# Patient Record
Sex: Female | Born: 1990 | Hispanic: Yes | Marital: Single | Smoking: Never smoker
Health system: Southern US, Community
[De-identification: ages and names within clinical notes are randomized; demographics above are authoritative.]

## PROBLEM LIST (undated history)

## (undated) ENCOUNTER — Ambulatory Visit: Payer: Medicaid Other

## (undated) DIAGNOSIS — F419 Anxiety disorder, unspecified: Secondary | ICD-10-CM

## (undated) DIAGNOSIS — D649 Anemia, unspecified: Secondary | ICD-10-CM

## (undated) DIAGNOSIS — K2970 Gastritis, unspecified, without bleeding: Secondary | ICD-10-CM

## (undated) DIAGNOSIS — K279 Peptic ulcer, site unspecified, unspecified as acute or chronic, without hemorrhage or perforation: Secondary | ICD-10-CM

## (undated) DIAGNOSIS — R7303 Prediabetes: Secondary | ICD-10-CM

## (undated) DIAGNOSIS — K219 Gastro-esophageal reflux disease without esophagitis: Secondary | ICD-10-CM

## (undated) HISTORY — PX: TUBAL LIGATION: SHX77

## (undated) HISTORY — DX: Anxiety disorder, unspecified: F41.9

## (undated) HISTORY — DX: Gastritis, unspecified, without bleeding: K29.70

## (undated) HISTORY — DX: Prediabetes: R73.03

## (undated) HISTORY — DX: Anemia, unspecified: D64.9

## (undated) HISTORY — PX: CHOLECYSTECTOMY: SHX55

## (undated) HISTORY — DX: Gastro-esophageal reflux disease without esophagitis: K21.9

---

## 2017-04-04 ENCOUNTER — Emergency Department (HOSPITAL_COMMUNITY)
Admission: EM | Admit: 2017-04-04 | Discharge: 2017-04-04 | Disposition: A | Discharge status: Home / Self Care | Payer: Medicaid Other | Attending: Emergency Medicine | Admitting: Emergency Medicine

## 2017-04-04 ENCOUNTER — Encounter (HOSPITAL_COMMUNITY): Payer: Self-pay

## 2017-04-04 DIAGNOSIS — R1010 Upper abdominal pain, unspecified: Secondary | ICD-10-CM | POA: Insufficient documentation

## 2017-04-04 DIAGNOSIS — R42 Dizziness and giddiness: Secondary | ICD-10-CM | POA: Diagnosis not present

## 2017-04-04 DIAGNOSIS — R197 Diarrhea, unspecified: Secondary | ICD-10-CM | POA: Insufficient documentation

## 2017-04-04 DIAGNOSIS — A084 Viral intestinal infection, unspecified: Secondary | ICD-10-CM | POA: Insufficient documentation

## 2017-04-04 DIAGNOSIS — R112 Nausea with vomiting, unspecified: Secondary | ICD-10-CM | POA: Diagnosis present

## 2017-04-04 LAB — COMPREHENSIVE METABOLIC PANEL
ALT: 38 U/L (ref 14–54)
AST: 36 U/L (ref 15–41)
Albumin: 4.5 g/dL (ref 3.5–5.0)
Alkaline Phosphatase: 83 U/L (ref 38–126)
Anion gap: 14 (ref 5–15)
BUN: 16 mg/dL (ref 6–20)
CO2: 20 mmol/L — ABNORMAL LOW (ref 22–32)
Calcium: 9.1 mg/dL (ref 8.9–10.3)
Chloride: 105 mmol/L (ref 101–111)
Creatinine, Ser: 0.69 mg/dL (ref 0.44–1.00)
GFR calc Af Amer: >60 mL/min (ref >60)
GFR calc non Af Amer: >60 mL/min (ref >60)
Glucose, Bld: 132 mg/dL — ABNORMAL HIGH (ref 65–99)
Potassium: 4 mmol/L (ref 3.5–5.1)
Sodium: 139 mmol/L (ref 135–145)
Total Bilirubin: 0.5 mg/dL (ref 0.3–1.2)
Total Protein: 8.8 g/dL — ABNORMAL HIGH (ref 6.5–8.1)

## 2017-04-04 LAB — CBC WITH DIFFERENTIAL/PLATELET
Basophils Absolute: 0 10*3/uL (ref 0.0–0.1)
Basophils Relative: 0 %
Eosinophils Absolute: 0 10*3/uL (ref 0.0–0.7)
Eosinophils Relative: 0 %
HCT: 44.2 % (ref 36.0–46.0)
Hemoglobin: 14.5 g/dL (ref 12.0–15.0)
Lymphocytes Relative: 7 %
Lymphs Abs: 1.1 10*3/uL (ref 0.7–4.0)
MCH: 27.5 pg (ref 26.0–34.0)
MCHC: 32.8 g/dL (ref 30.0–36.0)
MCV: 83.7 fL (ref 78.0–100.0)
Monocytes Absolute: 0.5 10*3/uL (ref 0.1–1.0)
Monocytes Relative: 3 %
Neutro Abs: 13.8 10*3/uL — ABNORMAL HIGH (ref 1.7–7.7)
Neutrophils Relative %: 90 %
Platelets: 362 10*3/uL (ref 150–400)
RBC: 5.28 MIL/uL — ABNORMAL HIGH (ref 3.87–5.11)
RDW: 13.8 % (ref 11.5–15.5)
WBC: 15.4 10*3/uL — ABNORMAL HIGH (ref 4.0–10.5)

## 2017-04-04 LAB — LIPASE, BLOOD: Lipase: 25 U/L (ref 11–51)

## 2017-04-04 LAB — HCG, QUANTITATIVE, PREGNANCY: hCG, Beta Chain, Quant, S: <1 m[IU]/mL (ref <5)

## 2017-04-04 MED ORDER — ONDANSETRON HCL 4 MG/2ML IJ SOLN
4.0000 mg | Freq: Once | INTRAMUSCULAR | Status: AC
  Administered 2017-04-04: 4 mg via INTRAVENOUS
  Filled 2017-04-04: qty 2

## 2017-04-04 MED ORDER — ONDANSETRON 4 MG PO TBDP: 4.0000 mg | ORAL_TABLET | Freq: Three times a day (TID) | ORAL | 0 refills | Status: DC | PRN

## 2017-04-04 MED ORDER — SODIUM CHLORIDE 0.9 % IV BOLUS (SEPSIS)
1000.0000 mL | Freq: Once | INTRAVENOUS | Status: AC
  Administered 2017-04-04: 1000 mL via INTRAVENOUS

## 2017-04-04 MED ORDER — MORPHINE SULFATE (PF) 4 MG/ML IV SOLN
8.0000 mg | Freq: Once | INTRAVENOUS | Status: AC
  Administered 2017-04-04: 8 mg via INTRAVENOUS
  Filled 2017-04-04: qty 2

## 2017-04-04 MED ORDER — GI COCKTAIL ~~LOC~~
30.0000 mL | Freq: Once | ORAL | Status: AC
  Administered 2017-04-04: 30 mL via ORAL
  Filled 2017-04-04: qty 30

## 2017-04-04 MED ORDER — PANTOPRAZOLE SODIUM 40 MG PO TBEC: 40.0000 mg | DELAYED_RELEASE_TABLET | Freq: Every day | ORAL | 0 refills | Status: DC

## 2017-04-04 NOTE — ED Notes (Signed)
Pt complains of flulike sx with nausea and vomiting since 1am

## 2017-04-04 NOTE — ED Provider Notes (Signed)
[facility]-EMERGENCY DEPT Provider Note   CSN: [id number] Arrival date & time: 04/04/17  0636     History   Chief Complaint Chief Complaint  Patient presents with  . flu like sx  . Emesis    HPI Berenda MoraleFranchesca Buntin is a 27 y.o. female.  HPI  27 year old female presents with vomiting and upper abdominal pain.  Patient speaks Spanish and the history is obtained with help of the Spanish interpreter line.  Since last night around 11 PM the patient's been having multiple episodes of emesis and a throbbing upper abdominal pain.  There is some back pain as well.  She denies any urinary symptoms or missed menstrual cycles.  She has been unable to keep down water.  This morning just prior to arrival she also developed diarrhea.  She denies cough, shortness of breath, chest pain.  She had subjective fevers and states EMS noted a fever but she does not know the number.  She has previously had her gallbladder surgically removed.  The pain is severe.  History reviewed. No pertinent past medical history.  There are no active problems to display for this patient.   History reviewed. No pertinent surgical history.  OB History    No data available       Home Medications    Prior to Admission medications   Medication Sig Start Date End Date Taking? Authorizing Provider  ondansetron (ZOFRAN ODT) 4 MG disintegrating tablet Take 1 tablet (4 mg total) by mouth every 8 (eight) hours as needed for nausea or vomiting. 04/04/17   Pricilla LovelessGoldston, Ilithyia Titzer, MD  pantoprazole (PROTONIX) 40 MG tablet Take 1 tablet (40 mg total) by mouth daily. 04/04/17   Pricilla LovelessGoldston, Jabarri Stefanelli, MD    Family History History reviewed. No pertinent family history.  Social History Social History   Tobacco Use  . Smoking status: Never Smoker  . Smokeless tobacco: Never Used  Substance Use Topics  . Alcohol use: No    Frequency: Never  . Drug use: No     Allergies   Patient has no allergy information on  record.   Review of Systems Review of Systems  Constitutional: Positive for fever (subjective).  Respiratory: Negative for cough and shortness of breath.   Cardiovascular: Negative for chest pain.  Gastrointestinal: Positive for abdominal pain, diarrhea, nausea and vomiting.  Genitourinary: Negative for dysuria, hematuria and menstrual problem.  Musculoskeletal: Positive for back pain.  Neurological: Positive for dizziness.  All other systems reviewed and are negative.    Physical Exam Updated Vital Signs BP 115/77   Pulse 78   Temp 98.3 F (36.8 C) (Oral)   Resp 12   Ht 5\' 9"  (1.753 m)   Wt 116.1 kg (256 lb)   LMP 03/10/2017   SpO2 100%   BMI 37.80 kg/m   Physical Exam  Constitutional: She is oriented to person, place, and time. She appears well-developed and well-nourished.  obese  HENT:  Head: Normocephalic and atraumatic.  Right Ear: External ear normal.  Left Ear: External ear normal.  Nose: Nose normal.  Eyes: Right eye exhibits no discharge. Left eye exhibits no discharge.  Cardiovascular: Normal rate, regular rhythm and normal heart sounds.  Pulmonary/Chest: Effort normal and breath sounds normal.  Abdominal: Soft. There is tenderness in the epigastric area.  Neurological: She is alert and oriented to person, place, and time.  Skin: Skin is warm and dry. She is not diaphoretic.  Nursing note and vitals reviewed.    ED Treatments /  Results  Labs (all labs ordered are listed, but only abnormal results are displayed) Labs Reviewed  COMPREHENSIVE METABOLIC PANEL - Abnormal; Notable for the following components:      Result Value   CO2 20 (*)    Glucose, Bld 132 (*)    Total Protein 8.8 (*)    All other components within normal limits  CBC WITH DIFFERENTIAL/PLATELET - Abnormal; Notable for the following components:   WBC 15.4 (*)    RBC 5.28 (*)    Neutro Abs 13.8 (*)    All other components within normal limits  LIPASE, BLOOD  HCG, QUANTITATIVE,  PREGNANCY    EKG  EKG Interpretation None       Radiology No results found.  Procedures Procedures (including critical care time)  Medications Ordered in ED Medications  sodium chloride 0.9 % bolus 1,000 mL (0 mLs Intravenous Stopped 04/04/17 1033)  morphine 4 MG/ML injection 8 mg (8 mg Intravenous Given 04/04/17 0913)  ondansetron (ZOFRAN) injection 4 mg (4 mg Intravenous Given 04/04/17 0911)  sodium chloride 0.9 % bolus 1,000 mL (0 mLs Intravenous Stopped 04/04/17 1252)  gi cocktail (Maalox,Lidocaine,Donnatal) (30 mLs Oral Given 04/04/17 1116)     Initial Impression / Assessment and Plan / ED Course  I have reviewed the triage vital signs and the nursing notes.  Pertinent labs & imaging results that were available during my care of the patient were reviewed by me and considered in my medical decision making (see chart for details).     Patient's symptoms are c/w viral gastroenteritis with vomiting, upper abd pain and some diarrhea. Appears better after GI cocktail and fluids. No urinary complaints. Already has had her gallbladder removed. Thus my suspicion for acute emergent pathology in abdomen is low, and I don't think CT would be beneficial. Discussed supportive care, fluids and return precautions.   Final Clinical Impressions(s) / ED Diagnoses   Final diagnoses:  Viral gastroenteritis  Upper abdominal pain    ED Discharge Orders        Ordered    pantoprazole (PROTONIX) 40 MG tablet  Daily     04/04/17 1241    ondansetron (ZOFRAN ODT) 4 MG disintegrating tablet  Every 8 hours PRN     04/04/17 1241       Pricilla Loveless, MD 04/05/17 1141

## 2017-04-04 NOTE — ED Triage Notes (Signed)
Pt complains of feeling weak and having general body aches since this am, pt also states that she's has vomited several times

## 2017-04-07 ENCOUNTER — Encounter (HOSPITAL_COMMUNITY): Payer: Self-pay | Admitting: Emergency Medicine

## 2017-04-07 DIAGNOSIS — R109 Unspecified abdominal pain: Secondary | ICD-10-CM | POA: Diagnosis present

## 2017-04-07 DIAGNOSIS — Z5321 Procedure and treatment not carried out due to patient leaving prior to being seen by health care provider: Secondary | ICD-10-CM | POA: Insufficient documentation

## 2017-04-07 LAB — COMPREHENSIVE METABOLIC PANEL
ALT: 61 U/L — ABNORMAL HIGH (ref 14–54)
AST: 49 U/L — ABNORMAL HIGH (ref 15–41)
Albumin: 4.3 g/dL (ref 3.5–5.0)
Alkaline Phosphatase: 73 U/L (ref 38–126)
Anion gap: 12 (ref 5–15)
BUN: 11 mg/dL (ref 6–20)
CO2: 19 mmol/L — ABNORMAL LOW (ref 22–32)
Calcium: 8.9 mg/dL (ref 8.9–10.3)
Chloride: 110 mmol/L (ref 101–111)
Creatinine, Ser: 0.77 mg/dL (ref 0.44–1.00)
GFR calc Af Amer: >60 mL/min (ref >60)
GFR calc non Af Amer: >60 mL/min (ref >60)
Glucose, Bld: 93 mg/dL (ref 65–99)
Potassium: 3.4 mmol/L — ABNORMAL LOW (ref 3.5–5.1)
Sodium: 141 mmol/L (ref 135–145)
Total Bilirubin: 0.6 mg/dL (ref 0.3–1.2)
Total Protein: 8.8 g/dL — ABNORMAL HIGH (ref 6.5–8.1)

## 2017-04-07 LAB — CBC
HCT: 45.5 % (ref 36.0–46.0)
Hemoglobin: 15.3 g/dL — ABNORMAL HIGH (ref 12.0–15.0)
MCH: 27.7 pg (ref 26.0–34.0)
MCHC: 33.6 g/dL (ref 30.0–36.0)
MCV: 82.3 fL (ref 78.0–100.0)
Platelets: 335 10*3/uL (ref 150–400)
RBC: 5.53 MIL/uL — ABNORMAL HIGH (ref 3.87–5.11)
RDW: 13.5 % (ref 11.5–15.5)
WBC: 7.6 10*3/uL (ref 4.0–10.5)

## 2017-04-07 LAB — I-STAT BETA HCG BLOOD, ED (MC, WL, AP ONLY): I-stat hCG, quantitative: <5 m[IU]/mL (ref <5)

## 2017-04-07 LAB — LIPASE, BLOOD: Lipase: 27 U/L (ref 11–51)

## 2017-04-07 MED ORDER — ONDANSETRON 4 MG PO TBDP
4.0000 mg | ORAL_TABLET | Freq: Once | ORAL | Status: AC | PRN
  Administered 2017-04-07: 4 mg via ORAL
  Filled 2017-04-07: qty 1

## 2017-04-07 NOTE — ED Notes (Signed)
Patient states she is unable to give a urine sample at this time 

## 2017-04-07 NOTE — ED Triage Notes (Signed)
Patient having vomiting since 5pm yesterday and generalized pain everywhere. Patient reports that she is having lots of belching. No issues with urination. Reports that Friday she came in ambulance very early for v/d.

## 2017-04-08 ENCOUNTER — Emergency Department (HOSPITAL_COMMUNITY)
Admission: EM | Admit: 2017-04-08 | Discharge: 2017-04-08 | Disposition: A | Discharge status: Home / Self Care | Payer: Medicaid Other | Attending: Emergency Medicine | Admitting: Emergency Medicine

## 2017-04-08 NOTE — ED Notes (Signed)
Pt called to be roomed without response.  RN notified.

## 2017-07-23 ENCOUNTER — Emergency Department (HOSPITAL_COMMUNITY)
Admission: EM | Admit: 2017-07-23 | Discharge: 2017-07-23 | Disposition: A | Discharge status: Home / Self Care | Payer: Medicaid Other | Attending: Emergency Medicine | Admitting: Emergency Medicine

## 2017-07-23 ENCOUNTER — Emergency Department (HOSPITAL_COMMUNITY): Payer: Medicaid Other

## 2017-07-23 ENCOUNTER — Encounter (HOSPITAL_COMMUNITY): Payer: Self-pay | Admitting: Emergency Medicine

## 2017-07-23 DIAGNOSIS — Z6841 Body Mass Index (BMI) 40.0 and over, adult: Secondary | ICD-10-CM | POA: Insufficient documentation

## 2017-07-23 DIAGNOSIS — R519 Headache, unspecified: Secondary | ICD-10-CM

## 2017-07-23 DIAGNOSIS — R079 Chest pain, unspecified: Secondary | ICD-10-CM | POA: Diagnosis not present

## 2017-07-23 DIAGNOSIS — R202 Paresthesia of skin: Secondary | ICD-10-CM | POA: Diagnosis not present

## 2017-07-23 DIAGNOSIS — R51 Headache: Secondary | ICD-10-CM | POA: Diagnosis present

## 2017-07-23 DIAGNOSIS — R29898 Other symptoms and signs involving the musculoskeletal system: Secondary | ICD-10-CM | POA: Insufficient documentation

## 2017-07-23 DIAGNOSIS — G43109 Migraine with aura, not intractable, without status migrainosus: Secondary | ICD-10-CM | POA: Insufficient documentation

## 2017-07-23 LAB — URINALYSIS, ROUTINE W REFLEX MICROSCOPIC
Bilirubin Urine: NEGATIVE
Glucose, UA: NEGATIVE mg/dL
Hgb urine dipstick: NEGATIVE
Ketones, ur: NEGATIVE mg/dL
Leukocytes, UA: NEGATIVE
Nitrite: NEGATIVE
Protein, ur: NEGATIVE mg/dL
Specific Gravity, Urine: 1.01 (ref 1.005–1.030)
pH: 7 (ref 5.0–8.0)

## 2017-07-23 LAB — BASIC METABOLIC PANEL
Anion gap: 10 (ref 5–15)
BUN: 13 mg/dL (ref 6–20)
CO2: 27 mmol/L (ref 22–32)
Calcium: 9.3 mg/dL (ref 8.9–10.3)
Chloride: 104 mmol/L (ref 101–111)
Creatinine, Ser: 0.7 mg/dL (ref 0.44–1.00)
GFR calc Af Amer: >60 mL/min (ref >60)
GFR calc non Af Amer: >60 mL/min (ref >60)
Glucose, Bld: 96 mg/dL (ref 65–99)
Potassium: 4 mmol/L (ref 3.5–5.1)
Sodium: 141 mmol/L (ref 135–145)

## 2017-07-23 LAB — CBC
HCT: 41.9 % (ref 36.0–46.0)
Hemoglobin: 13.6 g/dL (ref 12.0–15.0)
MCH: 27.2 pg (ref 26.0–34.0)
MCHC: 32.5 g/dL (ref 30.0–36.0)
MCV: 83.8 fL (ref 78.0–100.0)
Platelets: 330 10*3/uL (ref 150–400)
RBC: 5 MIL/uL (ref 3.87–5.11)
RDW: 13.2 % (ref 11.5–15.5)
WBC: 9.1 10*3/uL (ref 4.0–10.5)

## 2017-07-23 LAB — I-STAT TROPONIN, ED: Troponin i, poc: 0 ng/mL (ref 0.00–0.08)

## 2017-07-23 LAB — RAPID URINE DRUG SCREEN, HOSP PERFORMED
Amphetamines: NOT DETECTED
Barbiturates: NOT DETECTED
Benzodiazepines: NOT DETECTED
Cocaine: NOT DETECTED
Opiates: NOT DETECTED
Tetrahydrocannabinol: NOT DETECTED

## 2017-07-23 LAB — TSH: TSH: 2.199 u[IU]/mL (ref 0.350–4.500)

## 2017-07-23 LAB — I-STAT BETA HCG BLOOD, ED (MC, WL, AP ONLY): I-stat hCG, quantitative: <5 m[IU]/mL (ref <5)

## 2017-07-23 MED ORDER — METOCLOPRAMIDE HCL 5 MG/ML IJ SOLN
10.0000 mg | Freq: Once | INTRAMUSCULAR | Status: AC
  Administered 2017-07-23: 10 mg via INTRAVENOUS
  Filled 2017-07-23: qty 2

## 2017-07-23 MED ORDER — DIPHENHYDRAMINE HCL 50 MG/ML IJ SOLN
25.0000 mg | Freq: Once | INTRAMUSCULAR | Status: AC
  Administered 2017-07-23: 25 mg via INTRAVENOUS
  Filled 2017-07-23: qty 1

## 2017-07-23 NOTE — ED Notes (Addendum)
Per [facility] pt has 2 day hx of severe ha, right sided tingling in right arm/leg, only tingling in right leg now, no weakness. [facility] VSS.

## 2017-07-23 NOTE — ED Triage Notes (Signed)
Per [facility] pt from [employer] from her work for dental pain and nausea. Vitals; 136/80, 88Hr, 16R, 97%. Doesn't speak English well, will need interpretor

## 2017-07-23 NOTE — ED Notes (Signed)
Pt verbalized understanding to follow up with neurologist and no further questions, VSS, NAD. D/c home with family driving.

## 2017-07-23 NOTE — ED Notes (Signed)
Patient transported to X-ray 

## 2017-07-23 NOTE — ED Notes (Signed)
NEURO INTACT AT DISCHARGE.

## 2017-07-23 NOTE — ED Triage Notes (Signed)
Using [facility] spanish interpretor pt c/o central chest pains, headache, neck pain, right face and arm numbness that started this am around 6am.

## 2017-07-23 NOTE — Discharge Instructions (Addendum)
Follow-up with the neurologist provided.  Return here for any worsening in your condition.

## 2017-07-23 NOTE — ED Notes (Signed)
Patient transported to MRI 

## 2017-07-23 NOTE — ED Provider Notes (Signed)
[facility]-EMERGENC[facility] DEPT Provider Note   CSN: [id number] Arrival date & time: 07/23/17  1030     History   Chief Complaint Chief Complaint  Patient presents with  . Chest Pain  . Numbness  . Headache  . Neck Pain    HPI Cindy Dixon is a 27 y.o. female.  HPI Patient has headache that she describes as being on the right side of her head in the posterior and frontal area.  Today it was fairly severe.  She developed a feeling of tingling in the right upper extremity.  This occurred at 6 AM this morning at work.  Patient reports she has had headaches off and on for a couple of days.  Sometimes is been at the back of her head and towards the top and front.  Today was the first day she had tingling in the arm with it.  She denies she had any difficulty walking or weakness.  Denies any visual changes.  No history of headaches or migraine headache.  No recent illness.  She has been well without other medical problems.  Patient was brought by EMS from work. History reviewed. No pertinent past medical history.  There are no active problems to display for this patient.   History reviewed. No pertinent surgical history.   OB History   None      Home Medications    Prior to Admission medications   Medication Sig Start Date End Date Taking? Authorizing Provider  acetaminophen (TYLENOL) 500 MG tablet Take 500 mg by mouth daily as needed (tooth pain).   Yes [provider]  ibuprofen (ADVIL,MOTRIN) 800 MG tablet Take 800 mg by mouth daily as needed (pain).   Yes [provider]  ondansetron (ZOFRAN ODT) 4 MG disintegrating tablet Take 1 tablet (4 mg total) by mouth every 8 (eight) hours as needed for nausea or vomiting. Patient not taking: Reported on 07/23/2017 04/04/17   Pricilla Loveless, MD  pantoprazole (PROTONIX) 40 MG tablet Take 1 tablet (40 mg total) by mouth daily. Patient not taking: Reported on 07/23/2017 04/04/17   Pricilla Loveless, MD      Family History No family history on file.  Social History Social History   Tobacco Use  . Smoking status: Never Smoker  . Smokeless tobacco: Never Used  Substance Use Topics  . Alcohol use: No    Frequency: Never  . Drug use: No     Allergies   Patient has no known allergies.   Review of Systems Review of Systems 10 Systems reviewed and are negative for acute change except as noted in the HPI.   Physical Exam Updated Vital Signs BP (!) 138/98   Pulse 76   Temp 98.1 F (36.7 C) (Oral)   Resp 19   Ht 5\' 9"  (1.753 m)   Wt 125.2 kg (276 lb)   LMP 07/06/2017   SpO2 100%   BMI 40.76 kg/m   Physical Exam  Constitutional: She is oriented to person, place, and time.  She is alert and nontoxic.  She is sitting up in stretcher.  No somnolence.  No respiratory distress.  Obesity.  HENT:  Head: Normocephalic and atraumatic.  Right Ear: External ear normal.  Left Ear: External ear normal.  Nose: Nose normal.  Mouth/Throat: Oropharynx is clear and moist.  Eyes: Pupils are equal, round, and reactive to light. EOM are normal.  Neck: Neck supple.  Cardiovascular: Normal rate, regular rhythm, normal heart sounds and intact distal pulses.  Pulmonary/Chest: Effort normal and breath sounds normal.  Abdominal: Soft. She exhibits no distension. There is no tenderness. There is no guarding.  Musculoskeletal: Normal range of motion. She exhibits no edema or tenderness.  Neurological: She is alert and oriented to person, place, and time. She exhibits abnormal muscle tone. Coordination normal.  Nerves II through XII intact.  Speech is clear with normal content.  Slight difference in grip strength right to left.  Grip 4\5 on the right, 5\5 on the left.  No pronator drift.  Patient can individually elevate each lower extremity off of the bed and hold against resistance.  Dorsiflexion and plantarflexion bilaterally.  Sensation intact to light touch.  Skin: Skin is warm and dry.   Psychiatric: She has a normal mood and affect.     ED Treatments / Results  Labs (all labs ordered are listed, but only abnormal results are displayed) Labs Reviewed  BASIC METABOLIC PANEL  CBC  URINALYSIS, ROUTINE W REFLEX MICROSCOPIC  RAPID URINE DRUG SCREEN, HOSP PERFORMED  TSH  I-STAT BETA HCG BLOOD, ED ([facility], [facility], [facility] ONLY)  I-STAT TROPONIN, ED    EKG None  Radiology Dg Chest 2 View  Result Date: 07/23/2017 CLINICAL DATA:  Chest pain EXAM: CHEST - 2 VIEW COMPARISON:  None. FINDINGS: Lungs are clear. Heart size and pulmonary vascularity are normal. No pneumothorax. No adenopathy. No bone lesions. IMPRESSION: No edema or consolidation. Electronically Signed   By: Bretta Bang III M.D.   On: 07/23/2017 12:38    Procedures Procedures (including critical care time) CRITICAL CARE Performed by: Arby Barrette   Total critical care time: 20  minutes  Critical care time was exclusive of separately billable procedures and treating other patients.  Critical care was necessary to treat or prevent imminent or life-threatening deterioration.  Critical care was time spent personally by me on the following activities: development of treatment plan with patient and/or surrogate as well as nursing, discussions with consultants, evaluation of patient's response to treatment, examination of patient, obtaining history from patient or surrogate, ordering and performing treatments and interventions, ordering and review of laboratory studies, ordering and review of radiographic studies, pulse oximetry and re-evaluation of patient's condition. Medications Ordered in ED Medications  metoCLOPramide (REGLAN) injection 10 mg (10 mg Intravenous Given 07/23/17 1327)  diphenhydrAMINE (BENADRYL) injection 25 mg (25 mg Intravenous Given 07/23/17 1327)     Initial Impression / Assessment and Plan / ED Course  I have reviewed the triage vital signs and the nursing notes.  Pertinent labs & imaging  results that were available during my care of the patient were reviewed by me and considered in my medical decision making (see chart for details).    Consult: Discussed with Dr. Rolland Porter will except for transfer to [facility] emergency department for MRI.  Patient will not fit in MRI at [facility].  Final Clinical Impressions(s) / ED Diagnoses   Final diagnoses:  Nonintractable episodic headache, unspecified headache type  Paresthesias  Weakness of right upper extremity   Patient has had headaches off and on for several days.  Today she developed paresthesia and slight weakness on the right side.  Weakness was somewhat equivocal to exam.  Symptoms did seem to improve after administration of Reglan and Benadryl.  I do have suspicion for complex migraine as likely etiology.  Patient however has no personal history of migraines and there is no family history.  So for new onset with unilateral paresthesia or numbness I do feel MRI  should be obtained.  Patient symptoms have resolved with treatment with Reglan Benadryl.  If MRI is negative, I do feel patient stable for discharge and follow-up with neurology on outpatient basis. ED Discharge Orders    None       Arby BarrettePfeiffer, Margurite Duffy, MD 07/23/17 1450

## 2017-07-23 NOTE — ED Notes (Signed)
[facility] CALLED - DOUG DISPATCH- STATES APPROX. 1 HOUR

## 2017-07-23 NOTE — ED Notes (Signed)
ED Provider at bedside. 

## 2017-07-23 NOTE — ED Notes (Signed)
ED TO INPATIENT HANDOFF REPORT  Name/Age/Gender Cindy Dixon 27 y.o. female  Code Status   Home/SNF/Other Home  Chief Complaint dental pain   Level of Care/Admitting Diagnosis ED Disposition    ED Disposition Condition Comment   Transfer to Another Facility  The patient appears reasonably stabilized for transfer considering the current resources, flow, and capabilities available in the ED at this time, and I doubt any other [facility] requiring further screening and/or treatment in the ED prior to transfer is p resent.       Medical History History reviewed. No pertinent past medical history.  Allergies No Known Allergies  IV Location/Drains/Wounds Patient Lines/Drains/Airways Status   Active Line/Drains/Airways    Name:   Placement date:   Placement time:   Site:   Days:   Peripheral IV 07/23/17 Left Antecubital   07/23/17    1211    Antecubital   less than 1          Labs/Imaging Results for orders placed or performed during the hospital encounter of 07/23/17 (from the past 48 hour(s))  Basic metabolic panel     Status: None   Collection Time: 07/23/17 12:52 PM  Result Value Ref Range   Sodium 141 135 - 145 mmol/L   Potassium 4.0 3.5 - 5.1 mmol/L   Chloride 104 101 - 111 mmol/L   CO2 27 22 - 32 mmol/L   Glucose, Bld 96 65 - 99 mg/dL   BUN 13 6 - 20 mg/dL   Creatinine, Ser 0.70 0.44 - 1.00 mg/dL   Calcium 9.3 8.9 - 10.3 mg/dL   GFR calc non Af Amer >60 >60 mL/min   GFR calc Af Amer >60 >60 mL/min    Comment: (NOTE) The eGFR has been calculated using the CKD EPI equation. This calculation has not been validated in all clinical situations. eGFR's persistently <60 mL/min signify possible Chronic Kidney Disease.    Anion gap 10 5 - 15    Comment: Performed at [facility], [place] [address] [place], [place] [address]  CBC     Status: None   Collection Time: 07/23/17 12:52 PM  Result Value Ref Range   WBC 9.1 4.0 - 10.5 K/uL   RBC 5.00  3.87 - 5.11 MIL/uL   Hemoglobin 13.6 12.0 - 15.0 g/dL   HCT 41.9 36.0 - 46.0 %   MCV 83.8 78.0 - 100.0 fL   MCH 27.2 26.0 - 34.0 pg   MCHC 32.5 30.0 - 36.0 g/dL   RDW 13.2 11.5 - 15.5 %   Platelets 330 150 - 400 K/uL    Comment: Performed at [facility], [place] [address], [place], [place] [address]  TSH     Status: None   Collection Time: 07/23/17 12:52 PM  Result Value Ref Range   TSH 2.199 0.350 - 4.500 uIU/mL    Comment: Performed by a 3rd Generation assay with a functional sensitivity of <=0.01 uIU/mL. Performed at [facility], [place] [address] [place], [place] [address]   I-Stat Beta hCG blood, ED ([facility], [facility], [facility] only)     Status: None   Collection Time: 07/23/17  1:01 PM  Result Value Ref Range   I-stat hCG, quantitative <5.0 <5 mIU/mL   Comment 3            Comment:   GEST. AGE      CONC.  (mIU/mL)   <=1 WEEK        5 - 50  2 WEEKS       50 - 500     3 WEEKS       100 - 10,000     4 WEEKS     1,000 - 30,000        FEMALE AND NON-PREGNANT FEMALE:     LESS THAN 5 mIU/mL   I-stat troponin, ED     Status: None   Collection Time: 07/23/17  1:02 PM  Result Value Ref Range   Troponin i, poc 0.00 0.00 - 0.08 ng/mL   Comment 3            Comment: Due to the release kinetics of cTnI, a negative result within the first hours of the onset of symptoms does not rule out myocardial infarction with certainty. If myocardial infarction is still suspected, repeat the test at appropriate intervals.   Urinalysis, Routine w reflex microscopic     Status: None   Collection Time: 07/23/17  1:18 PM  Result Value Ref Range   Color, Urine YELLOW YELLOW   APPearance CLEAR CLEAR   Specific Gravity, Urine 1.010 1.005 - 1.030   pH 7.0 5.0 - 8.0   Glucose, UA NEGATIVE NEGATIVE mg/dL   Hgb urine dipstick NEGATIVE NEGATIVE   Bilirubin Urine NEGATIVE NEGATIVE   Ketones, ur NEGATIVE NEGATIVE mg/dL   Protein, ur NEGATIVE NEGATIVE mg/dL   Nitrite NEGATIVE  NEGATIVE   Leukocytes, UA NEGATIVE NEGATIVE    Comment: Performed at [place] [address] [place], [place] [address]  Urine rapid drug screen (hosp performed)     Status: None   Collection Time: 07/23/17  1:18 PM  Result Value Ref Range   Opiates NONE DETECTED NONE DETECTED   Cocaine NONE DETECTED NONE DETECTED   Benzodiazepines NONE DETECTED NONE DETECTED   Amphetamines NONE DETECTED NONE DETECTED   Tetrahydrocannabinol NONE DETECTED NONE DETECTED   Barbiturates NONE DETECTED NONE DETECTED    Comment: (NOTE) DRUG SCREEN FOR MEDICAL PURPOSES ONLY.  IF CONFIRMATION IS NEEDED FOR ANY PURPOSE, NOTIFY LAB WITHIN 5 DAYS. LOWEST DETECTABLE LIMITS FOR URINE DRUG SCREEN Drug Class                     Cutoff (ng/mL) Amphetamine and metabolites    1000 Barbiturate and metabolites    200 Benzodiazepine                 914 Tricyclics and metabolites     300 Opiates and metabolites        300 Cocaine and metabolites        300 THC                            50 Performed at [facility], [place] [address] [place], [place] [address]    Dg Chest 2 View  Result Date: 07/23/2017 CLINICAL DATA:  Chest pain EXAM: CHEST - 2 VIEW COMPARISON:  None. FINDINGS: Lungs are clear. Heart size and pulmonary vascularity are normal. No pneumothorax. No adenopathy. No bone lesions. IMPRESSION: No edema or consolidation. Electronically Signed   By: Lowella Grip III M.D.   On: 07/23/2017 12:38    Pending Labs Unresulted Labs (From admission, onward)   None      Vitals/Pain Today's Vitals   07/23/17 1203 07/23/17 1254 07/23/17 1530 07/23/17 1545  BP: (!) 138/98  (!) 155/97   Pulse: 76   73  Resp: 19  Temp:      TempSrc:      SpO2: 100%   100%  Weight:  276 lb (125.2 kg)    Height:  5' 9"  (1.753 m)    PainSc:        Isolation Precautions No active isolations  Medications Medications  metoCLOPramide (REGLAN) injection 10 mg (10 mg  Intravenous Given 07/23/17 1327)  diphenhydrAMINE (BENADRYL) injection 25 mg (25 mg Intravenous Given 07/23/17 1327)    Mobility walks

## 2017-07-23 NOTE — ED Provider Notes (Signed)
The patient does not have any signs of stroke on her MRI or other etiologies.  I have advised the patient of the findings and the fact that this is most likely related to her headaches as she does get them frequently.  I will have her follow-up with [facility] for further evaluation and care.   Charlestine NightLawyer, Rieley Khalsa, PA-C 07/23/17 2128    Lorre NickAllen, Anthony, MD 07/23/17 2306

## 2017-07-23 NOTE — ED Notes (Signed)
Patient was brought back from MRI. MRI transporter reported that the scan was unsuccessful due to the patient is to large for the scanner. Informed Dr. Judie PetitM. Pfeiffer of this information.

## 2018-01-14 ENCOUNTER — Encounter (HOSPITAL_COMMUNITY): Payer: Self-pay | Admitting: Emergency Medicine

## 2018-01-14 ENCOUNTER — Emergency Department (HOSPITAL_COMMUNITY): Payer: Medicaid Other

## 2018-01-14 ENCOUNTER — Emergency Department (HOSPITAL_COMMUNITY)
Admission: EM | Admit: 2018-01-14 | Discharge: 2018-01-14 | Disposition: A | Discharge status: Home / Self Care | Payer: Medicaid Other | Attending: Emergency Medicine | Admitting: Emergency Medicine

## 2018-01-14 DIAGNOSIS — R079 Chest pain, unspecified: Secondary | ICD-10-CM | POA: Diagnosis not present

## 2018-01-14 DIAGNOSIS — J111 Influenza due to unidentified influenza virus with other respiratory manifestations: Secondary | ICD-10-CM | POA: Insufficient documentation

## 2018-01-14 DIAGNOSIS — R0602 Shortness of breath: Secondary | ICD-10-CM | POA: Diagnosis not present

## 2018-01-14 DIAGNOSIS — R05 Cough: Secondary | ICD-10-CM | POA: Diagnosis present

## 2018-01-14 DIAGNOSIS — R6889 Other general symptoms and signs: Secondary | ICD-10-CM

## 2018-01-14 MED ORDER — IBUPROFEN 800 MG PO TABS
800.0000 mg | ORAL_TABLET | Freq: Once | ORAL | Status: AC
  Administered 2018-01-14: 800 mg via ORAL
  Filled 2018-01-14: qty 1

## 2018-01-14 MED ORDER — OSELTAMIVIR PHOSPHATE 75 MG PO CAPS: 75.0000 mg | ORAL_CAPSULE | Freq: Two times a day (BID) | ORAL | 0 refills | Status: DC

## 2018-01-14 MED ORDER — ALBUTEROL SULFATE (2.5 MG/3ML) 0.083% IN NEBU
5.0000 mg | INHALATION_SOLUTION | Freq: Once | RESPIRATORY_TRACT | Status: AC
Start: 2018-01-14 — End: 2018-01-14
  Administered 2018-01-14: 5 mg via RESPIRATORY_TRACT
  Filled 2018-01-14: qty 6

## 2018-01-14 NOTE — ED Triage Notes (Signed)
Pt presents to ED for assessment of two days of flu=like symptoms, fever, body aches, nasal congestion and difficulty taking a deep breath.

## 2018-01-14 NOTE — ED Notes (Signed)
Pt stable and ambulatory for discharge, states understanding follow up.  

## 2018-01-14 NOTE — ED Provider Notes (Signed)
[facility] Note   CSN: [id number] Arrival date & time: 01/14/18  1749     History   Chief Complaint Chief Complaint  Patient presents with  . flu-like symptoms    HPI Berenda MoraleFranchesca Oberman is a 27 y.o. female.  The history is provided by the patient. No language interpreter was used.  Cough  This is a new problem. The current episode started yesterday. The problem occurs constantly. The cough is non-productive. The maximum temperature recorded prior to her arrival was 100 to 100.9 F. Associated symptoms include chills and myalgias. She has tried nothing for the symptoms. The treatment provided no relief. She is not a smoker. Her past medical history does not include pneumonia or asthma.    History reviewed. No pertinent past medical history.  There are no active problems to display for this patient.   History reviewed. No pertinent surgical history.   OB History   None      Home Medications    Prior to Admission medications   Medication Sig Start Date End Date Taking? Authorizing Provider  acetaminophen (TYLENOL) 500 MG tablet Take 500 mg by mouth daily as needed (tooth pain).    [provider]  ibuprofen (ADVIL,MOTRIN) 800 MG tablet Take 800 mg by mouth daily as needed (pain).    [provider]  ondansetron (ZOFRAN ODT) 4 MG disintegrating tablet Take 1 tablet (4 mg total) by mouth every 8 (eight) hours as needed for nausea or vomiting. Patient not taking: Reported on 07/23/2017 04/04/17   Pricilla LovelessGoldston, Scott, MD  oseltamivir (TAMIFLU) 75 MG capsule Take 1 capsule (75 mg total) by mouth every 12 (twelve) hours. 01/14/18   Elson AreasSofia, Leslie K, PA-C  pantoprazole (PROTONIX) 40 MG tablet Take 1 tablet (40 mg total) by mouth daily. Patient not taking: Reported on 07/23/2017 04/04/17   Pricilla LovelessGoldston, Scott, MD    Family History History reviewed. No pertinent family history.  Social History Social History   Tobacco Use    . Smoking status: Never Smoker  . Smokeless tobacco: Never Used  Substance Use Topics  . Alcohol use: No    Frequency: Never  . Drug use: No     Allergies   Patient has no known allergies.   Review of Systems Review of Systems  Constitutional: Positive for chills.  Respiratory: Positive for cough.   Musculoskeletal: Positive for myalgias.  All other systems reviewed and are negative.    Physical Exam Updated Vital Signs BP 110/67   Pulse (!) 114   Temp 99.9 F (37.7 C) (Oral)   Resp 14   SpO2 98%   Physical Exam  Constitutional: She is oriented to person, place, and time. She appears well-developed and well-nourished.  HENT:  Head: Normocephalic.  Right Ear: External ear normal.  Left Ear: External ear normal.  Nose: Nose normal.  Mouth/Throat: Oropharynx is clear and moist.  Eyes: EOM are normal.  Neck: Normal range of motion.  Cardiovascular: Normal rate and regular rhythm.  Pulmonary/Chest: Effort normal. She has no wheezes. She exhibits no tenderness.  Abdominal: She exhibits no distension.  Musculoskeletal: Normal range of motion.  Neurological: She is alert and oriented to person, place, and time.  Psychiatric: She has a normal mood and affect.  Nursing note and vitals reviewed.    ED Treatments / Results  Labs (all labs ordered are listed, but only abnormal results are displayed) Labs Reviewed - No data to display  EKG None  Radiology Dg  Chest 2 View  Result Date: 01/14/2018 CLINICAL DATA:  Chest pain and shortness of breath for 10 days. EXAM: CHEST - 2 VIEW COMPARISON:  Chest x-ray dated 07/23/2017. FINDINGS: Study is hypoinspiratory with crowding of the perihilar and bibasilar bronchovascular markings. Given the low lung volumes, lungs appear clear. No confluent opacity to suggest a developing pneumonia. No pleural effusion or pneumothorax seen. Heart size and mediastinal contours are within normal limits. No acute or suspicious osseous  finding. IMPRESSION: Low lung volumes. No active cardiopulmonary disease. No evidence of pneumonia or pulmonary edema. Electronically Signed   By: Bary Richard M.D.   On: 01/14/2018 19:27    Procedures Procedures (including critical care time)  Medications Ordered in ED Medications  ibuprofen (ADVIL,MOTRIN) tablet 800 mg (800 mg Oral Given 01/14/18 1830)  albuterol (PROVENTIL) (2.5 MG/3ML) 0.083% nebulizer solution 5 mg (5 mg Nebulization Given 01/14/18 1830)     Initial Impression / Assessment and Plan / ED Course  I have reviewed the triage vital signs and the nursing notes.  Pertinent labs & imaging results that were available during my care of the patient were reviewed by me and considered in my medical decision making (see chart for details).     MDM  Chest xray no pneumonia.  Pt counseled on probable flu.  Pt does want to take tamiflu  Final Clinical Impressions(s) / ED Diagnoses   Final diagnoses:  Flu-like symptoms    ED Discharge Orders         Ordered    oseltamivir (TAMIFLU) 75 MG capsule  Every 12 hours     01/14/18 2125        An After Visit Summary was printed and given to the patient.    Osie Cheeks 01/14/18 2128    Charlynne Pander, MD 01/15/18 2007

## 2018-01-14 NOTE — Discharge Instructions (Signed)
Return if any problems.

## 2018-07-28 ENCOUNTER — Other Ambulatory Visit: Payer: Self-pay

## 2018-07-28 ENCOUNTER — Emergency Department (HOSPITAL_COMMUNITY)
Admission: EM | Admit: 2018-07-28 | Discharge: 2018-07-28 | Disposition: A | Discharge status: Home / Self Care | Payer: Medicaid Other | Attending: Emergency Medicine | Admitting: Emergency Medicine

## 2018-07-28 DIAGNOSIS — R51 Headache: Secondary | ICD-10-CM | POA: Diagnosis present

## 2018-07-28 DIAGNOSIS — B349 Viral infection, unspecified: Secondary | ICD-10-CM | POA: Diagnosis not present

## 2018-07-28 DIAGNOSIS — Z20828 Contact with and (suspected) exposure to other viral communicable diseases: Secondary | ICD-10-CM | POA: Diagnosis not present

## 2018-07-28 NOTE — ED Triage Notes (Signed)
Pt here for covid testing due to 2 family members testing positive-pt has headache

## 2018-07-28 NOTE — ED Provider Notes (Signed)
[facility] EMERGENCY DEPARTMENT Provider Note   CSN: [id number] Arrival date & time: 07/28/18  1652    History   Chief Complaint Chief Complaint  Patient presents with  . covid sx    HPI Cindy Dixon is a 28 y.o. female.     HPI   28 year old female presents today for coronavirus testing.  Patient notes that her father tested positive.  She notes a generalized headache, and chills x6 days.  She denies any fever, denies any cough or shortness of breath.  Denies any chronic health conditions.     No past medical history on file.  There are no active problems to display for this patient.   No past surgical history on file.   OB History   No obstetric history on file.      Home Medications    Prior to Admission medications   Medication Sig Start Date End Date Taking? Authorizing Provider  acetaminophen (TYLENOL) 500 MG tablet Take 500 mg by mouth daily as needed (tooth pain).    [provider]  ibuprofen (ADVIL,MOTRIN) 800 MG tablet Take 800 mg by mouth daily as needed (pain).    [provider]  ondansetron (ZOFRAN ODT) 4 MG disintegrating tablet Take 1 tablet (4 mg total) by mouth every 8 (eight) hours as needed for nausea or vomiting. Patient not taking: Reported on 07/23/2017 04/04/17   Sherwood Gambler, MD  oseltamivir (TAMIFLU) 75 MG capsule Take 1 capsule (75 mg total) by mouth every 12 (twelve) hours. 01/14/18   Fransico Meadow, PA-C  pantoprazole (PROTONIX) 40 MG tablet Take 1 tablet (40 mg total) by mouth daily. Patient not taking: Reported on 07/23/2017 04/04/17   Sherwood Gambler, MD    Family History No family history on file.  Social History Social History   Tobacco Use  . Smoking status: Never Smoker  . Smokeless tobacco: Never Used  Substance Use Topics  . Alcohol use: No    Frequency: Never  . Drug use: No     Allergies   Patient has no known allergies.   Review of Systems Review of Systems   All other systems reviewed and are negative.    Physical Exam Updated Vital Signs BP (!) 144/60 (BP Location: Right Arm)   Pulse 60   Temp 98.1 F (36.7 C) (Oral)   Resp 16   SpO2 99%   Physical Exam Vitals signs and nursing note reviewed.  Constitutional:      Appearance: She is well-developed.  HENT:     Head: Normocephalic and atraumatic.  Eyes:     General: No scleral icterus.       Right eye: No discharge.        Left eye: No discharge.     Conjunctiva/sclera: Conjunctivae normal.     Pupils: Pupils are equal, round, and reactive to light.  Neck:     Musculoskeletal: Normal range of motion.     Vascular: No JVD.     Trachea: No tracheal deviation.  Pulmonary:     Effort: Pulmonary effort is normal. No respiratory distress.     Breath sounds: Normal breath sounds. No stridor. No wheezing or rales.  Neurological:     Mental Status: She is alert and oriented to person, place, and time.     Coordination: Coordination normal.  Psychiatric:        Behavior: Behavior normal.        Thought Content: Thought content normal.  Judgment: Judgment normal.      ED Treatments / Results  Labs (all labs ordered are listed, but only abnormal results are displayed) Labs Reviewed - No data to display  EKG None  Radiology No results found.  Procedures Procedures (including critical care time)  Medications Ordered in ED Medications - No data to display   Initial Impression / Assessment and Plan / ED Course  I have reviewed the triage vital signs and the nursing notes.  Pertinent labs & imaging results that were available during my care of the patient were reviewed by me and considered in my medical decision making (see chart for details).        28 year old female presents today for COVID-19 testing.  She is afebrile with no objective findings.  Discussed outpatient testing for the patient.  Return precautions given.  She verbalized understanding and  agreement to today's plan.  Spanish translator was used throughout evaluation.  Final Clinical Impressions(s) / ED Diagnoses   Final diagnoses:  Viral illness    ED Discharge Orders    None       Rosalio LoudHedges, Kelsie Zaborowski, PA-C 07/28/18 1804    Benjiman CorePickering, Nathan, MD 07/28/18 [phone]

## 2018-07-28 NOTE — ED Notes (Signed)
Patient verbalizes understanding of discharge instructions. Opportunity for questioning and answers were provided. Pt discharged from ED. 

## 2018-07-28 NOTE — Discharge Instructions (Addendum)
Please read attached information. If you experience any new or worsening signs or symptoms please return to the emergency room for evaluation. Please follow-up with the [facility] as directed.  Please return immediately if you develop any new or worsening signs or symptoms.

## 2018-07-29 DIAGNOSIS — R509 Fever, unspecified: Secondary | ICD-10-CM | POA: Diagnosis not present

## 2018-07-29 DIAGNOSIS — Z20828 Contact with and (suspected) exposure to other viral communicable diseases: Secondary | ICD-10-CM | POA: Diagnosis not present

## 2018-07-29 DIAGNOSIS — R51 Headache: Secondary | ICD-10-CM | POA: Diagnosis not present

## 2018-09-25 ENCOUNTER — Ambulatory Visit: Payer: Medicaid Other | Admitting: Internal Medicine

## 2018-11-12 ENCOUNTER — Encounter: Payer: Self-pay | Admitting: Internal Medicine

## 2018-11-12 ENCOUNTER — Ambulatory Visit: Payer: Medicaid Other | Attending: Internal Medicine | Admitting: Internal Medicine

## 2018-11-12 ENCOUNTER — Other Ambulatory Visit: Payer: Self-pay

## 2018-11-12 VITALS — BP 110/75 | HR 80 | Temp 98.4°F | Resp 18 | Ht 68.0 in | Wt 301.0 lb

## 2018-11-12 DIAGNOSIS — Z6841 Body Mass Index (BMI) 40.0 and over, adult: Secondary | ICD-10-CM | POA: Diagnosis not present

## 2018-11-12 DIAGNOSIS — K219 Gastro-esophageal reflux disease without esophagitis: Secondary | ICD-10-CM

## 2018-11-12 DIAGNOSIS — H538 Other visual disturbances: Secondary | ICD-10-CM | POA: Diagnosis not present

## 2018-11-12 DIAGNOSIS — Z23 Encounter for immunization: Secondary | ICD-10-CM

## 2018-11-12 DIAGNOSIS — L858 Other specified epidermal thickening: Secondary | ICD-10-CM

## 2018-11-12 DIAGNOSIS — Z9049 Acquired absence of other specified parts of digestive tract: Secondary | ICD-10-CM | POA: Diagnosis not present

## 2018-11-12 DIAGNOSIS — H6123 Impacted cerumen, bilateral: Secondary | ICD-10-CM

## 2018-11-12 DIAGNOSIS — Z833 Family history of diabetes mellitus: Secondary | ICD-10-CM | POA: Insufficient documentation

## 2018-11-12 DIAGNOSIS — Z0000 Encounter for general adult medical examination without abnormal findings: Secondary | ICD-10-CM

## 2018-11-12 DIAGNOSIS — Z0001 Encounter for general adult medical examination with abnormal findings: Secondary | ICD-10-CM | POA: Diagnosis not present

## 2018-11-12 MED ORDER — OMEPRAZOLE 20 MG PO CPDR: 20.0000 mg | DELAYED_RELEASE_CAPSULE | Freq: Every day | ORAL | 3 refills | Status: DC

## 2018-11-12 NOTE — Progress Notes (Signed)
Patient ID: Cindy Dixon, female    DOB: 1990/12/06  MRN: [id number]  CC: New Patient (Initial Visit)   Subjective: Kaijah Abts is a 28 y.o. female who presents for new pt visit and requests physical. Her concerns today include:   No previous PCP  Here for physical but not pap.  Currently on cycle.  She denies any chronic medical issues.  She is not on any medications.  Review of system is below. Past medical, family history, social history, surgical history reviewed and updated. Current Outpatient Medications on File Prior to Visit  Medication Sig Dispense Refill  . acetaminophen (TYLENOL) 500 MG tablet Take 500 mg by mouth daily as needed (tooth pain).    Marland Kitchen ibuprofen (ADVIL,MOTRIN) 800 MG tablet Take 800 mg by mouth daily as needed (pain).     No current facility-administered medications on file prior to visit.     No Known Allergies  Social History   Socioeconomic History  . Marital status: Single    Spouse name: Not on file  . Number of children: 2  . Years of education: 58  . Highest education level: Not on file  Occupational History  . Not on file  Social Needs  . Financial resource strain: Not on file  . Food insecurity    Worry: Not on file    Inability: Not on file  . Transportation needs    Medical: Not on file    Non-medical: Not on file  Tobacco Use  . Smoking status: Never Smoker  . Smokeless tobacco: Never Used  Substance and Sexual Activity  . Alcohol use: Yes    Frequency: Never    Comment: occasionally  . Drug use: No  . Sexual activity: Yes  Lifestyle  . Physical activity    Days per week: Not on file    Minutes per session: Not on file  . Stress: Not on file  Relationships  . Social Musician on phone: Not on file    Gets together: Not on file    Attends religious service: Not on file    Active member of club or organization: Not on file    Attends meetings of clubs or organizations: Not on file   Relationship status: Not on file  . Intimate partner violence    Fear of current or ex partner: Not on file    Emotionally abused: Not on file    Physically abused: Not on file    Forced sexual activity: Not on file  Other Topics Concern  . Not on file  Social History Narrative  . Not on file    Family History  Problem Relation Age of Onset  . Hypertension Mother   . Hypertension Father   . Arrhythmia Father   . Diabetes Maternal Grandfather   . Diabetes Paternal Grandfather     Past Surgical History:  Procedure Laterality Date  . CHOLECYSTECTOMY      ROS: Review of Systems  Constitutional: Positive for appetite change (over eats and feels very anxious about it and like she can not control her eating). Negative for activity change and unexpected weight change (gained 61 lbs in the past few yrs).  HENT:       Gets a lo of wax build up  Eyes: Positive for visual disturbance (blurred a time.  Last ye exam was 3 yrs ago.  Does not wear glasses).  Respiratory: Negative for cough and shortness of breath.   Cardiovascular: Negative  for chest pain.  Gastrointestinal:       Complains of bad acid reflux.  She is not on any NSAIDs.  Genitourinary:       Reports heart burn over past few mths.  Not on any OTC NSAIDs.  Psychiatric/Behavioral: The patient is nervous/anxious.     PHYSICAL EXAM: BP 110/75 (BP Location: Left Arm, Patient Position: Sitting, Cuff Size: Large)   Pulse 80   Temp 98.4 F (36.9 C) (Oral)   Resp 18   Ht 5\' 8"  (1.727 m)   Wt (!) 301 lb (136.5 kg)   LMP 11/11/2018   SpO2 99%   BMI 45.77 kg/m   Physical Exam  General appearance - alert, well appearing, morbidly obese young Hispanic female and in no distress Mental status - normal mood, behavior, speech, dress, motor activity, and thought processes Eyes - pupils equal and reactive, extraocular eye movements intact Ears - bilateral TM's and external ear canals normal, ceruminosis noted Nose - normal and  patent, no erythema, discharge or polyps Mouth - mucous membranes moist, pharynx normal without lesions Neck - supple, no significant adenopathy Lymphatics - no palpable lymphadenopathy, no hepatosplenomegaly Chest - clear to auscultation, no wheezes, rales or rhonchi, symmetric air entry Heart - normal rate, regular rhythm, normal S1, S2, no murmurs, rubs, clicks or gallops Abdomen - soft, nontender, nondistended, no masses or organomegaly Pelvic exam: Deferred as patient is currently on her menstrual cycle. Neurological - cranial nerves II through XII intact, motor and sensory grossly normal bilaterally Musculoskeletal - no joint tenderness, deformity or swelling Extremities - peripheral pulses normal, no pedal edema, no clubbing or cyanosis Skin: Small horn in the left temporal area  Depression screen [facility] 2/9 11/12/2018  Decreased Interest 0  Down, Depressed, Hopeless 0  PHQ - 2 Score 0  Altered sleeping 0  Tired, decreased energy 0  Change in appetite 3  Feeling bad or failure about yourself  0  Trouble concentrating 0  Moving slowly or fidgety/restless 0  Suicidal thoughts 0  PHQ-9 Score 3   GAD 7 : Generalized Anxiety Score 11/12/2018  Nervous, Anxious, on Edge 3  Control/stop worrying 1  Worry too much - different things 1  Trouble relaxing 0  Restless 0  Easily annoyed or irritable 0  Afraid - awful might happen 0  Total GAD 7 Score 5     CMP Latest Ref Rng & Units 07/23/2017 04/07/2017 04/04/2017  Glucose 65 - 99 mg/dL 96 93 132(H)  BUN 6 - 20 mg/dL 13 11 16   Creatinine 0.44 - 1.00 mg/dL 0.70 0.77 0.69  Sodium 135 - 145 mmol/L 141 141 139  Potassium 3.5 - 5.1 mmol/L 4.0 3.4(L) 4.0  Chloride 101 - 111 mmol/L 104 110 105  CO2 22 - 32 mmol/L 27 19(L) 20(L)  Calcium 8.9 - 10.3 mg/dL 9.3 8.9 9.1  Total Protein 6.5 - 8.1 g/dL - 8.8(H) 8.8(H)  Total Bilirubin 0.3 - 1.2 mg/dL - 0.6 0.5  Alkaline Phos 38 - 126 U/L - 73 83  AST 15 - 41 U/L - 49(H) 36  ALT 14 - 54 U/L -  61(H) 38   Lipid Panel  No results found for: CHOL, TRIG, HDL, CHOLHDL, VLDL, LDLCALC, LDLDIRECT  CBC    Component Value Date/Time   WBC 9.1 07/23/2017 1252   RBC 5.00 07/23/2017 1252   HGB 13.6 07/23/2017 1252   HCT 41.9 07/23/2017 1252   PLT 330 07/23/2017 1252   MCV 83.8 07/23/2017 1252  MCH 27.2 07/23/2017 1252   MCHC 32.5 07/23/2017 1252   RDW 13.2 07/23/2017 1252   LYMPHSABS 1.1 04/04/2017 0859   MONOABS 0.5 04/04/2017 0859   EOSABS 0.0 04/04/2017 0859   BASOSABS 0.0 04/04/2017 0859    ASSESSMENT AND PLAN: 1. Annual physical exam We deferred doing Pap smear today.  We will bring her back in several weeks to do the Pap since she is currently on her menstrual cycle  2. Class 3 severe obesity due to excess calories without serious comorbidity with body mass index (BMI) of 45.0 to 49.9 in adult [facility](HCC) Discussed healthy eating habits and regular exercise.  Advised patient to cut back on white carbohydrates, eliminate sugary drinks from the diet and eat more white meat than red meat.  Encouraged to incorporate fresh fruits and vegetables into the diet. Advised to get in some form of moderate intensity exercise 3 to 4 days a week for 30 to 45 minutes. - CBC - Comprehensive metabolic panel - Lipid panel - Hemoglobin A1c - Amb Ref to Medical Weight Management  3. Bilateral impacted cerumen Advised to purchase and use wax softener in the ear for several days.  On her next visit we can have our CMA flush the ears  4. Blurred vision Advised getting routine eye exam at least once every 2 years.  5. Cutaneous Horn Patient would like to have this skin tag removed.  Will refer to dermatology - Ambulatory referral to Dermatology  6. Gastroesophageal reflux disease without esophagitis GERD precautions discussed and encouraged.  Went over foods to avoid.  Avoid NSAIDs.  Advised to eat her last meal at least 2 to 3 hours before laying down at night and to sleep with her head slightly  elevated - omeprazole (PRILOSEC) 20 MG capsule; Take 1 capsule (20 mg total) by mouth daily.  Dispense: 30 capsule; Refill: 3  7. Need for influenza vaccination Given    Patient was given the opportunity to ask questions.  Patient verbalized understanding of the plan and was able to repeat key elements of the plan.  [employer] interpreter used during this encounter. #[id number]  Orders Placed This Encounter  Procedures  . Flu Vaccine QUAD 6+ mos PF IM (Fluarix Quad PF)  . CBC  . Comprehensive metabolic panel  . Lipid panel  . Hemoglobin A1c  . Amb Ref to Medical Weight Management  . Ambulatory referral to Dermatology     Requested Prescriptions   Signed Prescriptions Disp Refills  . omeprazole (PRILOSEC) 20 MG capsule 30 capsule 3    Sig: Take 1 capsule (20 mg total) by mouth daily.    Return in about 1 month (around 12/13/2018) for PAP.  Jonah Blueeborah Jakhiya Brower, MD, FACP

## 2018-11-12 NOTE — Patient Instructions (Addendum)
Obesidad en los adultos Obesity, Adult La obesidad es un exceso de grasa corporal. Ser obeso significa que su peso es ms de lo que es saludable para usted. El IMC es un nmero que indica la cantidad de grasa corporal que tiene una persona. Si usted tiene un ndice de masa corporal (IMC) de 30o ms, esto significa que es obeso. A menudo, la causa de la obesidad es comer o beber ms caloras que las que el cuerpo usa. Cambiar el estilo de vida puede ayudarlo a bajar de peso. La obesidad puede causar problemas de salud graves, como los siguientes:  Accidente cerebrovascular.  Arteriopata coronaria (EAC).  Diabetes tipo 2.  Algunos tipos de cncer, incluido el cncer de colon, mama, tero y vescula.  Artrosis.  Presin arterial alta (hipertensin arterial).  Colesterol alto.  Apnea del sueo.  Clculos en la vescula.  Problemas de esterilidad. Cules son las causas?  Consumir todos los das alimentos con altos niveles de caloras, azcar y grasa.  Nacer con genes que pueden hacerlo ms propenso a ser obeso.  Tener una afeccin mdica que causa obesidad.  Tomar ciertos medicamentos.  Permanecer mucho tiempo sentado (tener un estilo de vida sedentario).  No dormir lo suficiente.  Beber gran cantidad de bebidas con azcar. Qu incrementa el riesgo?  Tener antecedentes familiares de obesidad.  Ser mujer afroamericana.  Ser hombre de origen hispano.  Vivir en un rea con acceso limitado a las siguientes posibilidades: ? Parques, centros recreativos o veredas. ? Alimentos saludables, como se venden en tiendas de comestibles y mercados de agricultores. Cules son los signos o los sntomas? El principal signo es tener demasiada grasa corporal. Cmo se trata?  El tratamiento de esta afeccin frecuentemente incluye cambiar el estilo de vida. El tratamiento puede incluir: ? Cambios en la dieta. Esto puede incluir crear un plan de alimentacin saludable. ? Realizar  actividad fsica. Puede incluir una actividad que hace que el corazn lata ms rpido (ejercicio aerbico) y entrenamiento de fuerza. Trabaje con su mdico para disear un programa que funcione para usted. ? Medicamentos para ayudarlo a perder peso. Pueden utilizarse si no puede perder 1 libra (450g) por semana despus de 6 semanas de alimentacin saludable y ms ejercicio. ? Tratar las afecciones que causan la obesidad. ? [place]. Las opciones pueden incluir bandas gstricas y bypass gstrico. Esto puede realizarse en las siguientes situaciones:  Otros tratamientos no mejoraron su afeccin.  Tiene un IMC de 40 o superior.  Tiene problemas de salud potencialmente mortales relacionados con la obesidad. Siga estas instrucciones en su casa: Comida y bebida   Siga las instrucciones del mdico respecto de las comidas y las bebidas. Su mdico puede recomendarle lo siguiente: ? Limitar las comidas rpidas, los dulces y las colaciones procesadas. ? Elegir opciones con bajo contenido de grasas. Por ejemplo, leche descremada en lugar de leche entera. ? Consumir 5o ms porciones de frutas o verduras por da. ? Comer en casa con ms frecuencia. Esto le da ms control sobre lo que come. ? Cuando coma afuera, elija alimentos saludables. ? Aprenda a leer las etiquetas de los alimentos. Esto le ayudar a aprender qu cantidad de alimento hay en una porcin. ? Tenga a mano colaciones con bajo contenido de grasas. ? Evite las bebidas que contengan mucha azcar. Estas incluyen refrescos, jugo de frutas, t helado con azcar y leche saborizada.  Beba suficiente agua para mantener el pis (la orina) de color amarillo plido.  No siga las dietas de moda. Actividad   fsica  Haga ejercicios con frecuencia, como se lo haya indicado el mdico. La mayora de los adultos deben hacer hasta 123minutos de ejercicio de intensidad moderada cada semana.Pregntele al mdico lo siguiente: ? Los tipos de ejercicios que  son seguros para usted. ? La frecuencia con la que [employer] ejercicios.  Precaliente y elongue adecuadamente antes de hacer actividad fsica.  Haga un estiramiento lento despus de la actividad (relajacin).  Descanse entre los perodos de [place]. Estilo de vida  Trabaje con su mdico y con un experto en alimentacin (nutricionista) para establecer un objetivo de prdida de peso que sea adecuado para usted.  Limite el tiempo que pasa frente a una pantalla.  Busque formas de recompensarse que no incluyan alimentos.  No beba alcohol si: ? El mdico le indica que no lo haga. ? Est embarazada, puede estar embarazada o est tratando de quedar embarazada.  Si bebe alcohol: ? Limite la cantidad que bebe a lo siguiente:  De 0 a 1 medida por da para las mujeres.  De 0 a 2 medidas por da para los hombres. ? Est atento a la cantidad de alcohol que hay en las bebidas que toma. En los [place], una medida equivale a una botella de cerveza de 12oz (364ml), un vaso de vino de 5oz (153ml) o un vaso de una bebida alcohlica de alta graduacin de 1oz (72ml). Instrucciones generales  Lleve un diario de su prdida de peso. Esto puede ayudarlo a Conservator, museum/gallery de lo siguiente: ? Los alimentos que come. ? Cunto ejercicio realiza.  Tome los medicamentos de venta libre y los recetados solamente como se lo haya indicado el [place] vitaminas y suplementos solamente como se lo haya indicado el mdico.  Considere participar en un grupo de apoyo.  Concurra a todas las visitas de seguimiento como se lo haya indicado el mdico. Esto es importante. Comunquese con un mdico si:  No puede alcanzar su objetivo de prdida de peso despus de haber modificado su dieta y su estilo de vida durante 6semanas. Solicite ayuda inmediatamente si:  Tiene dificultad para respirar.  Tiene pensamientos acerca de lastimarse. Resumen  La obesidad es un exceso de Air traffic controller.   Ser obeso significa que su peso es ms de lo que es saludable para usted.  Trabaje con su mdico para establecer un objetivo de prdida de [place].  Haga actividad fsica con regularidad tal como le indic el mdico. Esta informacin no tiene Marine scientist el consejo del mdico. Asegrese de hacerle al mdico cualquier pregunta que tenga. Document Released: 07/30/2011 Document Revised: 10/23/2017 Document Reviewed: 10/23/2017 Elsevier Patient Education  2020 [place] reflujo gastroesofgico en los adultos Gastroesophageal Reflux Disease, Adult El reflujo gastroesofgico (RGE) ocurre cuando el cido del estmago sube por el tubo que conecta la boca con el estmago (esfago). Normalmente, la comida baja por el esfago y se mantiene en el estmago, donde se la digiere. Cuando una persona tiene RGE, los alimentos y el cido estomacal suelen volver al esfago. Usted puede tener una enfermedad llamada enfermedad de reflujo gastroesofgico (ERGE) si el reflujo:  Sucede a menudo.  Causa sntomas frecuentes o muy intensos.  Causa problemas tales como dao en el esfago. Cuando esto ocurre, el esfago duele y se hincha (inflama). Con el tiempo, la ERGE puede ocasionar pequeos agujeros (lceras) en el revestimiento del esfago. Cules son las causas? Esta afeccin se debe a un problema en el msculo que se encuentra  entre el esfago y Investment banker, corporate. Cuando este msculo est dbil o no es normal, no se cierra correctamente para impedir que los alimentos y el cido regresen del Teaching laboratory technician. El msculo puede debilitarse debido a lo siguiente:  El consumo de [place].  Marianne.  Tener cierto tipo de hernia (hernia de hiato).  Consumo de alcohol.  Ciertos alimentos y bebidas, como caf, chocolate, cebollas y [place]. Qu incrementa el riesgo? Es ms probable que tenga esta afeccin si:  Tiene sobrepeso.  Tiene una enfermedad que afecta el tejido conjuntivo.  [place]  antiinflamatorios no esteroideos (AINE). Cules son los signos o los sntomas? Los sntomas de esta afeccin incluyen:  Acidez estomacal.  Dificultad o dolor al tragar.  Sensacin de Warehouse manager un bulto en la garganta.  Sabor amargo en la boca.  Mal aliento.  Tener una gran cantidad de saliva.  Estmago inflamado o con Dentist.  Eructos.  Dolor en el pecho. El dolor de pecho puede deberse a distintas afecciones. Asegrese de Science writer a su mdico si tiene Journalist, newspaper.  Falta de aire o respiracin ruidosa (sibilancias).  Tos constante (crnica) o durante la noche.  Desgaste de la superficie de los dientes (esmalte dental).  Prdida de peso. Cmo se trata? El tratamiento depender de la gravedad de los sntomas. El mdico puede sugerirle lo siguiente:  Cambios en la dieta.  Medicamentos.  [place]. Siga estas indicaciones en su casa: Comida y bebida   Siga una dieta como se lo haya indicado el mdico. Es posible que deba evitar alimentos y bebidas, por ejemplo: ? Caf y t (con o sin cafena). ? Bebidas que contengan alcohol. ? Bebidas energticas y deportivas. ? Bebidas gaseosas y refrescos. ? Chocolate y cacao. ? Menta y esencia de [place]. ? Ajo y cebolla. ? Rbano picante. ? Alimentos cidos y condimentados. Estos incluyen todos los tipos de pimientos, [place] en polvo, curry en polvo, vinagre, salsas picantes y [employer]. ? Ctricos y sus jugos, por ejemplo, naranjas, limones y limas. ? Alimentos que [employer]. Estos incluyen salsa roja, [place], salsa picante y pizza con salsa de [place]. ? Alimentos fritos y Lexicographer. Estos incluyen donas, papas fritas, papitas fritas de bolsa y aderezos con alto contenido de [place] ? Carnes con alto contenido de [place] Estas incluye los perros calientes, chuletas o costillas, embutidos, jamn y tocino. ? Productos lcteos ricos en grasas, como leche [place], [place] y Lilly crema.  Consuma pequeas cantidades de  comida con ms frecuencia. Evite consumir porciones abundantes.  Evite beber grandes cantidades de lquidos con las comidas.  Evite comer 2 o 3horas antes de acostarse.  Evite recostarse inmediatamente despus de comer.  No haga ejercicios enseguida despus de comer. Estilo de vida   No consuma ningn producto que contenga nicotina o tabaco. Estos incluyen cigarrillos, cigarrillos electrnicos y tabaco para Theatre manager. Si necesita ayuda para dejar de fumar, consulte al [employer].  Intente reducir [employer] de estrs. Si necesita ayuda para hacer esto, consulte al mdico.  Si tiene sobrepeso, baje una cantidad de peso saludable para usted. Consulte a su mdico para bajar de peso de [employer]. Indicaciones generales  Est atento a cualquier cambio en los sntomas.  Tome los medicamentos de venta libre y los recetados solamente como se lo haya indicado el mdico. No tome aspirina, ibuprofeno ni otros AINE a menos que el mdico lo autorice.  Use ropa holgada. No use nada apretado alrededor de la cintura.  Levante (eleve) la cabecera de la cama aproximadamente  6pulgadas (15cm).  Evite inclinarse si al hacerlo empeoran los sntomas.  Concurra a todas las visitas de [address]wayseguimiento como se lo haya indicado el mdico. Esto es importante. Comunquese con un mdico si:  Aparecen nuevos sntomas.  Adelgaza y no sabe por qu.  Tiene problemas para tragar o le duele cuando traga.  Tiene sibilancias o tos persistente.  Los sntomas no mejoran con Scientist, research (medical)el tratamiento.  Tiene la voz ronca. Solicite ayuda inmediatamente si:  Goldman SachsSiente dolor en los brazos, el cuello, la Clarendonmandbula, los dientes o la espalda.  Se siente transpirado, mareado o tiene una sensacin de desvanecimiento.  Siente falta de aire o Journalist, newspaperdolor en el pecho.  Vomita y el vmito tiene un aspecto similar a la sangre o a los posos de caf.  Pierde el conocimiento (se desmaya).  Las deposiciones (heces) son sanguinolentas o negras.   No puede tragar, beber o comer. Resumen  Si una persona tiene enfermedad de reflujo gastroesofgico (ERGE), los alimentos y el cido estomacal suben al esfago y causan sntomas o problemas tales como dao en el esfago.  El tratamiento depender de la gravedad de los sntomas.  Siga una Air traffic controllerdieta como se lo haya indicado el mdico.  Tome todos los medicamentos solamente como se lo haya indicado el mdico. Esta informacin no tiene Theme park managercomo fin reemplazar el consejo del mdico. Asegrese de hacerle al mdico cualquier pregunta que tenga. Document Released: 03/02/2010 Document Revised: 09/11/2017 Document Reviewed: 09/11/2017 Elsevier Patient Education  2020 [employer]

## 2018-11-13 LAB — LIPID PANEL
Chol/HDL Ratio: 4 ratio (ref 0.0–4.4)
Cholesterol, Total: 157 mg/dL (ref 100–199)
HDL: 39 mg/dL — ABNORMAL LOW (ref >39)
LDL Chol Calc (NIH): 96 mg/dL (ref 0–99)
Triglycerides: 124 mg/dL (ref 0–149)
VLDL Cholesterol Cal: 22 mg/dL (ref 5–40)

## 2018-11-13 LAB — COMPREHENSIVE METABOLIC PANEL
ALT: 23 IU/L (ref 0–32)
AST: 17 IU/L (ref 0–40)
Albumin/Globulin Ratio: 1.7 (ref 1.2–2.2)
Albumin: 4.6 g/dL (ref 3.9–5.0)
Alkaline Phosphatase: 80 IU/L (ref 39–117)
BUN/Creatinine Ratio: 15 (ref 9–23)
BUN: 11 mg/dL (ref 6–20)
Bilirubin Total: 0.2 mg/dL (ref 0.0–1.2)
CO2: 26 mmol/L (ref 20–29)
Calcium: 9.6 mg/dL (ref 8.7–10.2)
Chloride: 100 mmol/L (ref 96–106)
Creatinine, Ser: 0.74 mg/dL (ref 0.57–1.00)
GFR calc Af Amer: 126 mL/min/{1.73_m2} (ref >59)
GFR calc non Af Amer: 109 mL/min/{1.73_m2} (ref >59)
Globulin, Total: 2.7 g/dL (ref 1.5–4.5)
Glucose: 84 mg/dL (ref 65–99)
Potassium: 4.6 mmol/L (ref 3.5–5.2)
Sodium: 141 mmol/L (ref 134–144)
Total Protein: 7.3 g/dL (ref 6.0–8.5)

## 2018-11-13 LAB — HEMOGLOBIN A1C
Est. average glucose Bld gHb Est-mCnc: 117 mg/dL
Hgb A1c MFr Bld: 5.7 % — ABNORMAL HIGH (ref 4.8–5.6)

## 2018-11-13 LAB — CBC
Hematocrit: 41.2 % (ref 34.0–46.6)
Hemoglobin: 13.2 g/dL (ref 11.1–15.9)
MCH: 27.5 pg (ref 26.6–33.0)
MCHC: 32 g/dL (ref 31.5–35.7)
MCV: 86 fL (ref 79–97)
Platelets: 342 10*3/uL (ref 150–450)
RBC: 4.8 x10E6/uL (ref 3.77–5.28)
RDW: 13.5 % (ref 11.7–15.4)
WBC: 11.4 10*3/uL — ABNORMAL HIGH (ref 3.4–10.8)

## 2018-11-24 ENCOUNTER — Telehealth: Payer: Self-pay | Admitting: *Deleted

## 2018-11-24 NOTE — Telephone Encounter (Signed)
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  Healthy eating habits and regular exercise as discussed on recent office visit will help to prevent progression to DM.

## 2018-11-24 NOTE — Telephone Encounter (Signed)
Medical Assistant used [employer] to contact patient.  Interpreter Name: Dawson Bills Interpreter #: [phone] LVM on [phone] Patient verified DOB Patient is aware of WBC being elevated in the CBC other than that it was normal. Patients kidney, liver, cholesterol and CBG was normal. Patient is aware of needing to implement healthy eating habits and regular exercise to prevent progression to DM.

## 2018-11-24 NOTE — Telephone Encounter (Signed)
Patient is aware of referrals being placed to [facility] for dermatology and Medical Management for Nutrition. Patient is aware of receiving a phone call regarding the scheduling.

## 2018-12-14 ENCOUNTER — Other Ambulatory Visit: Payer: Self-pay

## 2018-12-14 DIAGNOSIS — Z20822 Contact with and (suspected) exposure to covid-19: Secondary | ICD-10-CM

## 2018-12-15 LAB — NOVEL CORONAVIRUS, NAA: SARS-CoV-2, NAA: NOT DETECTED

## 2018-12-16 ENCOUNTER — Telehealth: Payer: Self-pay | Admitting: General Practice

## 2018-12-16 NOTE — Telephone Encounter (Signed)
Negative COVID results given. Patient results "NOT Detected." Caller expressed understanding. ° °

## 2018-12-28 ENCOUNTER — Telehealth: Payer: Self-pay | Admitting: Pediatrics

## 2018-12-28 NOTE — Telephone Encounter (Signed)
Pt would like an update on her referrals, please follow up

## 2018-12-31 NOTE — Telephone Encounter (Signed)
Dermatology referral was sent on  10/1  Still pending by  [place]

## 2019-01-20 ENCOUNTER — Telehealth: Payer: Self-pay | Admitting: Internal Medicine

## 2019-01-20 ENCOUNTER — Other Ambulatory Visit: Payer: Self-pay | Admitting: Internal Medicine

## 2019-01-20 DIAGNOSIS — K219 Gastro-esophageal reflux disease without esophagitis: Secondary | ICD-10-CM

## 2019-01-20 MED ORDER — OMEPRAZOLE 20 MG PO CPDR: 20.0000 mg | DELAYED_RELEASE_CAPSULE | Freq: Every day | ORAL | 4 refills | Status: DC

## 2019-01-20 NOTE — Telephone Encounter (Signed)
The referral  Was sent 11/12/2018 to [facility] practice  Dermatology clinic  . I will faxed to [place]  . Thank you

## 2019-01-20 NOTE — Telephone Encounter (Signed)
Cindy Dixon make you check on referral for pt. Looks like you sent the referral on 10/1.

## 2019-01-20 NOTE — Telephone Encounter (Signed)
Patient called stating she has not been called by the dermatologist for the mole she has in her face. Patient states that the mole hurts and it keeps growing. Patient would like for PCP to prescribe her an Rx until she can see the dermatologist. Patient also states that she went to [facility] for her omeprazole (PRILOSEC) 20 MG capsule and the pharmacy said they did not receive the Rx.

## 2019-01-28 ENCOUNTER — Other Ambulatory Visit: Payer: Medicaid Other | Admitting: Internal Medicine

## 2019-03-01 ENCOUNTER — Ambulatory Visit: Payer: Medicaid Other | Admitting: Internal Medicine

## 2019-03-11 DIAGNOSIS — G44209 Tension-type headache, unspecified, not intractable: Secondary | ICD-10-CM | POA: Diagnosis not present

## 2019-03-11 DIAGNOSIS — H40033 Anatomical narrow angle, bilateral: Secondary | ICD-10-CM | POA: Diagnosis not present

## 2019-03-21 DIAGNOSIS — H5213 Myopia, bilateral: Secondary | ICD-10-CM | POA: Diagnosis not present

## 2019-03-23 ENCOUNTER — Encounter (HOSPITAL_COMMUNITY): Payer: Self-pay | Admitting: Emergency Medicine

## 2019-03-23 ENCOUNTER — Other Ambulatory Visit: Payer: Self-pay

## 2019-03-23 ENCOUNTER — Emergency Department (HOSPITAL_COMMUNITY)
Admission: EM | Admit: 2019-03-23 | Discharge: 2019-03-23 | Disposition: A | Discharge status: Home / Self Care | Payer: Medicaid Other | Attending: Emergency Medicine | Admitting: Emergency Medicine

## 2019-03-23 ENCOUNTER — Ambulatory Visit (HOSPITAL_COMMUNITY)
Admission: EM | Admit: 2019-03-23 | Discharge: 2019-03-23 | Disposition: A | Discharge status: Other Acute Inpatient Hospital | Payer: Medicaid Other

## 2019-03-23 ENCOUNTER — Encounter (HOSPITAL_COMMUNITY): Payer: Self-pay

## 2019-03-23 DIAGNOSIS — Z9049 Acquired absence of other specified parts of digestive tract: Secondary | ICD-10-CM

## 2019-03-23 DIAGNOSIS — Z8711 Personal history of peptic ulcer disease: Secondary | ICD-10-CM

## 2019-03-23 DIAGNOSIS — K920 Hematemesis: Secondary | ICD-10-CM | POA: Insufficient documentation

## 2019-03-23 DIAGNOSIS — R1013 Epigastric pain: Secondary | ICD-10-CM | POA: Diagnosis not present

## 2019-03-23 DIAGNOSIS — R1010 Upper abdominal pain, unspecified: Secondary | ICD-10-CM

## 2019-03-23 DIAGNOSIS — R109 Unspecified abdominal pain: Secondary | ICD-10-CM

## 2019-03-23 HISTORY — DX: Peptic ulcer, site unspecified, unspecified as acute or chronic, without hemorrhage or perforation: K27.9

## 2019-03-23 LAB — TYPE AND SCREEN
ABO/RH(D): O POS
Antibody Screen: NEGATIVE

## 2019-03-23 LAB — COMPREHENSIVE METABOLIC PANEL
ALT: 30 U/L (ref 0–44)
AST: 20 U/L (ref 15–41)
Albumin: 4.3 g/dL (ref 3.5–5.0)
Alkaline Phosphatase: 65 U/L (ref 38–126)
Anion gap: 10 (ref 5–15)
BUN: 11 mg/dL (ref 6–20)
CO2: 25 mmol/L (ref 22–32)
Calcium: 9.4 mg/dL (ref 8.9–10.3)
Chloride: 104 mmol/L (ref 98–111)
Creatinine, Ser: 1.15 mg/dL — ABNORMAL HIGH (ref 0.44–1.00)
GFR calc Af Amer: >60 mL/min (ref >60)
GFR calc non Af Amer: >60 mL/min (ref >60)
Glucose, Bld: 96 mg/dL (ref 70–99)
Potassium: 3.9 mmol/L (ref 3.5–5.1)
Sodium: 139 mmol/L (ref 135–145)
Total Bilirubin: 0.6 mg/dL (ref 0.3–1.2)
Total Protein: 7.8 g/dL (ref 6.5–8.1)

## 2019-03-23 LAB — CBC
HCT: 43.4 % (ref 36.0–46.0)
Hemoglobin: 14 g/dL (ref 12.0–15.0)
MCH: 27.5 pg (ref 26.0–34.0)
MCHC: 32.3 g/dL (ref 30.0–36.0)
MCV: 85.3 fL (ref 80.0–100.0)
Platelets: 324 10*3/uL (ref 150–400)
RBC: 5.09 MIL/uL (ref 3.87–5.11)
RDW: 13 % (ref 11.5–15.5)
WBC: 10.5 10*3/uL (ref 4.0–10.5)
nRBC: 0 % (ref 0.0–0.2)

## 2019-03-23 LAB — URINALYSIS, ROUTINE W REFLEX MICROSCOPIC
Bacteria, UA: NONE SEEN
Bilirubin Urine: NEGATIVE
Glucose, UA: NEGATIVE mg/dL
Ketones, ur: NEGATIVE mg/dL
Leukocytes,Ua: NEGATIVE
Nitrite: NEGATIVE
Protein, ur: NEGATIVE mg/dL
RBC / HPF: >50 RBC/hpf — ABNORMAL HIGH (ref 0–5)
Specific Gravity, Urine: 1.021 (ref 1.005–1.030)
pH: 5 (ref 5.0–8.0)

## 2019-03-23 LAB — I-STAT BETA HCG BLOOD, ED (MC, WL, AP ONLY): I-stat hCG, quantitative: <5 m[IU]/mL (ref <5)

## 2019-03-23 LAB — LIPASE, BLOOD: Lipase: 23 U/L (ref 11–51)

## 2019-03-23 LAB — POC OCCULT BLOOD, ED: Fecal Occult Bld: POSITIVE — AB

## 2019-03-23 LAB — ABO/RH: ABO/RH(D): O POS

## 2019-03-23 MED ORDER — ALUM & MAG HYDROXIDE-SIMETH 200-200-20 MG/5ML PO SUSP
30.0000 mL | Freq: Once | ORAL | Status: AC
  Administered 2019-03-23: 30 mL via ORAL
  Filled 2019-03-23: qty 30

## 2019-03-23 MED ORDER — OMEPRAZOLE 40 MG PO CPDR: 40.0000 mg | DELAYED_RELEASE_CAPSULE | Freq: Every day | ORAL | 0 refills | Status: DC

## 2019-03-23 MED ORDER — SUCRALFATE 1 GM/10ML PO SUSP
1.0000 g | Freq: Three times a day (TID) | ORAL | Status: DC
  Administered 2019-03-23: 1 g via ORAL
  Filled 2019-03-23 (×2): qty 10

## 2019-03-23 MED ORDER — FAMOTIDINE IN NACL 20-0.9 MG/50ML-% IV SOLN
20.0000 mg | Freq: Once | INTRAVENOUS | Status: AC
  Administered 2019-03-23: 20 mg via INTRAVENOUS
  Filled 2019-03-23: qty 50

## 2019-03-23 NOTE — ED Triage Notes (Signed)
Pt sent from urgent care for vomiting blood for 5 days. Denies diarrhea. Pt complains of abd pain. Pt has hx of peptic ulcer disease.

## 2019-03-23 NOTE — Discharge Instructions (Signed)
Your symptoms and physical exam findings are concerning for a gastrointestinal bleed as you are vomiting blood and have a hx of peptic ulcer disease. Please report to the ER immediately for an emergent consultation, lab work and/or imaging.

## 2019-03-23 NOTE — Discharge Instructions (Addendum)
You were given a medication called omeprazole for your abdominal pain. Please take as directed.   Please follow up with gastroenterology in regards to your abdominal pain.  Please call the office schedule an appointment for follow-up.  Please return to the ER sooner if you have any new or worsening symptoms, or if you have any of the following symptoms:  Abdominal pain that does not go away.  You have a fever.  You keep throwing up (vomiting).  The pain is felt only in portions of the abdomen. Pain in the right side could possibly be appendicitis. In an adult, pain in the left lower portion of the abdomen could be colitis or diverticulitis.  You pass bloody or black tarry stools.  There is bright red blood in the stool.  The constipation stays for more than 4 days.  There is belly (abdominal) or rectal pain.  You do not seem to be getting better.  You have any questions or concerns.   ----   Conley Rolls dieron un medicamento llamado omeprazol para su dolor abdominal. Tmelo segn las indicaciones.  Haga un seguimiento con gastroenterologa con respecto a su dolor abdominal. Llame a la oficina para programar una cita para seguimiento. Regrese a la sala de emergencias antes si tiene algn sntoma nuevo o que empeora, o si tiene alguno de los siguientes sntomas:   Dolor abdominal que no desaparece.  Tienes fiebre.  Sigue vomitando (vomitando).  El dolor se siente solo en partes del abdomen. El dolor en el lado derecho posiblemente podra ser apendicitis. En un adulto, el dolor en la parte inferior izquierda del abdomen puede ser colitis o diverticulitis.  Tiene heces fecales con sangre o alquitranadas.  Hay sangre roja brillante en las heces.  El estreimiento permanece por ms de 4 das.  Hay dolor abdominal (abdominal) o rectal.  No parece estar mejorando.  Tiene preguntas o inquietudes.

## 2019-03-23 NOTE — ED Provider Notes (Signed)
[facility] EMERGENCY DEPARTMENT Provider Note   CSN: [id number] Arrival date & time: 03/23/19  1443     History Chief Complaint  Patient presents with  . Hematemesis   Spanish translator was used throughout this evaluation  Cindy Dixon is a 29 y.o. female.  HPI   Pt is a 29 y/o F with a h/o PUD, obesity, GERD, cholecystectomy, gastritis, who presents to the ED today for eval of abd pain that started for the last few days. Pain is located to the epigastrium. Pain rated 8/10. Pain is constant. Pain feels like a burning sensation. She has been taking TUMs which have not resolved. Pain seems to be worse when she eats.   States that she started having nausea and vomiting 2 days ago and she noticed some blood in her vomiting. States that there was a "tiny" amount of blood in her vomit. She has had no further episodes of vomiting or hematemesis. Denies diarrhea, constipation. Denies bloody stools or melena.  Denies heavy NSAID use but states that she uses ibuprofen when she has a headache. She used it a few times last week, but did not take more than recommended dose. Denies heavy ETOH use.   Past Medical History:  Diagnosis Date  . Peptic ulcer disease     Patient Active Problem List   Diagnosis Date Noted  . Class 3 severe obesity due to excess calories without serious comorbidity with body mass index (BMI) of 45.0 to 49.9 in adult (HCC) 11/12/2018  . Bilateral impacted cerumen 11/12/2018  . Gastroesophageal reflux disease without esophagitis 11/12/2018    Past Surgical History:  Procedure Laterality Date  . CHOLECYSTECTOMY       OB History   No obstetric history on file.     Family History  Problem Relation Age of Onset  . Hypertension Mother   . Hypertension Father   . Arrhythmia Father   . Diabetes Maternal Grandfather   . Diabetes Paternal Grandfather     Social History   Tobacco Use  . Smoking status: Never Smoker  . Smokeless  tobacco: Never Used  Substance Use Topics  . Alcohol use: Yes    Comment: occasionally  . Drug use: No    Home Medications Prior to Admission medications   Medication Sig Start Date End Date Taking? Authorizing Provider  acetaminophen (TYLENOL) 500 MG tablet Take 500 mg by mouth daily as needed (tooth pain).    [provider]  ibuprofen (ADVIL,MOTRIN) 800 MG tablet Take 800 mg by mouth daily as needed (pain).    [provider]  omeprazole (PRILOSEC) 40 MG capsule Take 1 capsule (40 mg total) by mouth daily for 14 days. 03/23/19 04/06/19  Amiayah Giebel S, PA-C    Allergies    Patient has no known allergies.  Review of Systems   Review of Systems  Constitutional: Negative for chills and fever.  HENT: Negative for ear pain and sore throat.   Eyes: Negative for visual disturbance.  Respiratory: Negative for cough and shortness of breath.   Cardiovascular: Negative for chest pain.  Gastrointestinal: Positive for abdominal pain, nausea and vomiting (resolved). Negative for blood in stool, constipation and diarrhea.  Genitourinary: Negative for dysuria and hematuria.  Musculoskeletal: Negative for back pain.  Skin: Negative for rash.  Neurological: Negative for dizziness.  All other systems reviewed and are negative.   Physical Exam Updated Vital Signs BP 115/70   Pulse 64   Temp 98 F (36.7 C) (  Oral)   Resp 12   LMP 03/23/2019   SpO2 96%   Physical Exam Vitals and nursing note reviewed.  Constitutional:      General: She is not in acute distress.    Appearance: She is well-developed. She is not ill-appearing or toxic-appearing.  HENT:     Head: Normocephalic and atraumatic.  Eyes:     Conjunctiva/sclera: Conjunctivae normal.  Cardiovascular:     Rate and Rhythm: Normal rate and regular rhythm.     Pulses: Normal pulses.     Heart sounds: Normal heart sounds. No murmur.  Pulmonary:     Effort: Pulmonary effort is normal. No respiratory distress.      Breath sounds: Normal breath sounds. No wheezing, rhonchi or rales.  Abdominal:     General: Bowel sounds are normal.     Palpations: Abdomen is soft.     Tenderness: There is abdominal tenderness in the epigastric area. There is no right CVA tenderness, left CVA tenderness, guarding or rebound.  Musculoskeletal:     Cervical back: Neck supple.  Skin:    General: Skin is warm and dry.  Neurological:     Mental Status: She is alert.     ED Results / Procedures / Treatments   Labs (all labs ordered are listed, but only abnormal results are displayed) Labs Reviewed  COMPREHENSIVE METABOLIC PANEL - Abnormal; Notable for the following components:      Result Value   Creatinine, Ser 1.15 (*)    All other components within normal limits  URINALYSIS, ROUTINE W REFLEX MICROSCOPIC - Abnormal; Notable for the following components:   Hgb urine dipstick LARGE (*)    RBC / HPF >50 (*)    All other components within normal limits  POC OCCULT BLOOD, ED - Abnormal; Notable for the following components:   Fecal Occult Bld POSITIVE (*)    All other components within normal limits  CBC  LIPASE, BLOOD  I-STAT BETA HCG BLOOD, ED ([facility], [facility], [facility] ONLY)  TYPE AND SCREEN  ABO/RH    EKG None  Radiology No results found.  Procedures Procedures (including critical care time)  Medications Ordered in ED Medications  sucralfate (CARAFATE) 1 GM/10ML suspension 1 g (1 g Oral Given 03/23/19 2200)  famotidine (PEPCID) IVPB 20 mg premix (0 mg Intravenous Stopped 03/23/19 2049)  alum & mag hydroxide-simeth (MAALOX/MYLANTA) 200-200-20 MG/5ML suspension 30 mL (30 mLs Oral Given 03/23/19 2020)    ED Course  I have reviewed the triage vital signs and the nursing notes.  Pertinent labs & imaging results that were available during my care of the patient were reviewed by me and considered in my medical decision making (see chart for details).    MDM Rules/Calculators/A&P                      29 year old  female presenting for evaluation of epigastric abdominal pain.  States she has a history of gastritis and this feels similar.  Pain is worse when she eats.  She had an episode of vomiting 2 days ago where she vomited a tiny bit of blood.  Since then she has had no further episodes of vomiting or hematemesis.  Denies bloody stools or melena.  Vital signs regular during.  She does have some epigastric abdominal tenderness initially.  Reviewed labs CBC without leukocytosis, no anemia.  Hemoglobin actually improved from 4 months ago CMP with mildly elevated creatinine 1.15, otherwise labs reassuring Lipase negative UA without evidence  for UTI Hemoccult was positive  Patient with symptoms suggestive of gastritis.  Had an episode of a mild amount of blood in her vomit 2 days ago and has had no further episodes.  Although her Hemoccult was positive, patient is on her menstrual cycle and I have suspicion that this may be a false positive.  She was given multiple medications in the ED and on reassessment she states she feels much improved and now is tolerating p.o.  Repeat abdominal exam is benign and without peritoneal signs. she does not have any evidence of complicated GI bleed therefore do not feel that she requires admission at this time.  I will start her on a PPI and give her information to follow-up with GI.  Advised on specific return precautions.  Patient voiced understanding of plan and reasons to return.  All questions answered.  Patient stable for discharge.   Final Clinical Impression(s) / ED Diagnoses Final diagnoses:  Abdominal pain, unspecified abdominal location    Rx / DC Orders ED Discharge Orders         Ordered    omeprazole (PRILOSEC) 40 MG capsule  Daily     03/23/19 2136           Karrie Meres, PA-C 03/23/19 2144    Cathren Laine, MD 03/23/19 2333

## 2019-03-23 NOTE — ED Provider Notes (Signed)
[place]   MRN: [id number] DOB: 1991/01/29  Subjective:   Cindy Dixon is a 29 y.o. female presenting for 5 day hx of acute onset recurrent upper abdominal pain that is now severe.  Has had nausea with vomiting, hematemesis.  States that she is getting small spots that look like bruises on her forearms bilaterally.  Her symptoms have made her feel really fatigued and have caused headaches.  She has a history of peptic ulcer disease and has not had good follow-up for this.  Has a history of cholecystectomy, appendectomy.  She had an annual exam on 11/2018, labs showed mild leukocytosis and prediabetes.  No current facility-administered medications for this encounter.  Current Outpatient Medications:  .  acetaminophen (TYLENOL) 500 MG tablet, Take 500 mg by mouth daily as needed (tooth pain)., Disp: , Rfl:  .  ibuprofen (ADVIL,MOTRIN) 800 MG tablet, Take 800 mg by mouth daily as needed (pain)., Disp: , Rfl:  .  omeprazole (PRILOSEC) 20 MG capsule, Take 1 capsule (20 mg total) by mouth daily., Disp: 30 capsule, Rfl: 4   No Known Allergies  History reviewed. No pertinent past medical history.   Past Surgical History:  Procedure Laterality Date  . CHOLECYSTECTOMY      Family History  Problem Relation Age of Onset  . Hypertension Mother   . Hypertension Father   . Arrhythmia Father   . Diabetes Maternal Grandfather   . Diabetes Paternal Grandfather     Social History   Tobacco Use  . Smoking status: Never Smoker  . Smokeless tobacco: Never Used  Substance Use Topics  . Alcohol use: Yes    Comment: occasionally  . Drug use: No    ROS   Objective:   Vitals: BP 131/79 (BP Location: Right Arm)   Pulse 76   Temp 97.8 F (36.6 C) (Oral)   Resp 18   Wt (!) 302 lb (137 kg)   LMP 03/23/2019   SpO2 100%   BMI 45.92 kg/m   Physical Exam Constitutional:      General: She is not in acute distress.    Appearance: Normal appearance. She is  well-developed. She is obese. She is not ill-appearing, toxic-appearing or diaphoretic.  HENT:     Head: Normocephalic and atraumatic.     Nose: Nose normal.     Mouth/Throat:     Mouth: Mucous membranes are moist.  Eyes:     Extraocular Movements: Extraocular movements intact.     Pupils: Pupils are equal, round, and reactive to light.  Cardiovascular:     Rate and Rhythm: Normal rate and regular rhythm.     Pulses: Normal pulses.     Heart sounds: Normal heart sounds. No murmur. No friction rub. No gallop.   Pulmonary:     Effort: Pulmonary effort is normal. No respiratory distress.     Breath sounds: Normal breath sounds. No stridor. No wheezing, rhonchi or rales.  Abdominal:     Tenderness: There is generalized abdominal tenderness (Worse upper mid and upper abdomen) and tenderness in the right upper quadrant, right lower quadrant, epigastric area and left upper quadrant. There is guarding. There is no right CVA tenderness, left CVA tenderness or rebound.  Skin:    General: Skin is warm and dry.     Findings: No rash.  Neurological:     General: No focal deficit present.     Mental Status: She is alert and oriented to person, place, and time.  Cranial Nerves: No cranial nerve deficit.     Motor: No weakness.  Psychiatric:        Mood and Affect: Mood normal.        Behavior: Behavior normal.        Thought Content: Thought content normal.      Assessment and Plan :   1. Hematemesis with nausea   2. Upper abdominal pain   3. History of peptic ulcer disease   4. History of cholecystectomy   5. History of appendectomy    Case discussed with Dr. Leonides Grills.  Primary concern is that patient has active GI bleed given her hematemesis and severe abdominal pain in setting of history of PUD.  Recommended patient present to the emergency room for more work-up. Counseled patient on potential for adverse effects with medications prescribed/recommended today, ER and return-to-clinic  precautions discussed, patient verbalized understanding.      Wallis Bamberg, [place] 03/23/19 1445

## 2019-03-23 NOTE — ED Notes (Signed)
Discharge instructions given, pt. verbalized understanding.

## 2019-03-23 NOTE — ED Triage Notes (Signed)
Pt state she has a stomach ache and headache. X 5 days. Nausea and vomiting as well.pt states she has a rash as well .

## 2019-03-23 NOTE — ED Notes (Signed)
Sent a urine culture with the urine specimen 

## 2019-03-25 ENCOUNTER — Encounter: Payer: Self-pay | Admitting: Internal Medicine

## 2019-03-25 ENCOUNTER — Ambulatory Visit: Payer: Medicaid Other | Attending: Internal Medicine | Admitting: Internal Medicine

## 2019-03-25 ENCOUNTER — Other Ambulatory Visit: Payer: Self-pay

## 2019-03-25 VITALS — BP 123/83 | HR 69 | Temp 97.5°F | Resp 16 | Wt 296.2 lb

## 2019-03-25 DIAGNOSIS — Z833 Family history of diabetes mellitus: Secondary | ICD-10-CM | POA: Diagnosis not present

## 2019-03-25 DIAGNOSIS — K219 Gastro-esophageal reflux disease without esophagitis: Secondary | ICD-10-CM

## 2019-03-25 DIAGNOSIS — Z8249 Family history of ischemic heart disease and other diseases of the circulatory system: Secondary | ICD-10-CM | POA: Diagnosis not present

## 2019-03-25 DIAGNOSIS — Z79899 Other long term (current) drug therapy: Secondary | ICD-10-CM | POA: Insufficient documentation

## 2019-03-25 DIAGNOSIS — Z9049 Acquired absence of other specified parts of digestive tract: Secondary | ICD-10-CM | POA: Insufficient documentation

## 2019-03-25 DIAGNOSIS — Z6841 Body Mass Index (BMI) 40.0 and over, adult: Secondary | ICD-10-CM | POA: Diagnosis not present

## 2019-03-25 MED ORDER — OMEPRAZOLE 40 MG PO CPDR: 40.0000 mg | DELAYED_RELEASE_CAPSULE | Freq: Two times a day (BID) | ORAL | 1 refills | Status: DC

## 2019-03-25 NOTE — Progress Notes (Signed)
Pt states she is not feeling well  Pt states she is feeling a lot of nausea and dizziness cause her period comes on twice a month and the hospital states she is anemic and she has a lot of blood coming out   Pt states she feel [place] in her stomach and she is not able to eat because of the pain is constant

## 2019-03-25 NOTE — Progress Notes (Signed)
Patient ID: Cindy Dixon, female    DOB: [birth year]  MRN: [id number]  CC: Hospitalization Follow-up (ED )   Subjective: Cindy Dixon is a 29 y.o. female who presents for f/u ER Her concerns today include:  History of obesity, GERD, preDM  Pt seen in ER 03/23/2019 for epigastric abdominal pain x 2-3 days.  Pain was rated as 8/10, constant and burning in character.  She was not taking any NSAIDs consistently.  Had noticed a pinch of blood when she vomited on Sunday.  Vitals in the emergency room were good.  Baseline blood tests including CBC, LFTs and lipase normal.  Started on omeprazole 40 mg daily.  Previously she was on omeprazole 20 mg daily that I had prescribed for her when I saw her for the first time 11/2018.  She reports that omeprazole has not helped.  She is having to use Tums. "Burning is terrible" and she has not eaten for few days because anything she eats makes it worse.  +Nausea No blood in stools or black stools. Not taking Ibuprofen Reports being told in [place] 5 yrs ago that she had and also.  However on further questioning she has never had an EGD  Patient Active Problem List   Diagnosis Date Noted  . Class 3 severe obesity due to excess calories without serious comorbidity with body mass index (BMI) of 45.0 to 49.9 in adult (HCC) 11/12/2018  . Bilateral impacted cerumen 11/12/2018  . Gastroesophageal reflux disease without esophagitis 11/12/2018     Current Outpatient Medications on File Prior to Visit  Medication Sig Dispense Refill  . acetaminophen (TYLENOL) 500 MG tablet Take 500 mg by mouth daily as needed (tooth pain).     No current facility-administered medications on file prior to visit.    No Known Allergies  Social History   Socioeconomic History  . Marital status: Single    Spouse name: Not on file  . Number of children: 2  . Years of education: 92  . Highest education level: Not on file  Occupational History  . Not on file    Tobacco Use  . Smoking status: Never Smoker  . Smokeless tobacco: Never Used  Substance and Sexual Activity  . Alcohol use: Yes    Comment: occasionally  . Drug use: No  . Sexual activity: Yes  Other Topics Concern  . Not on file  Social History Narrative  . Not on file   Social Determinants of Health   Financial Resource Strain:   . Difficulty of Paying Living Expenses: Not on file  Food Insecurity:   . Worried About Programme researcher, broadcasting/film/video in the Last Year: Not on file  . Ran Out of Food in the Last Year: Not on file  Transportation Needs:   . Lack of Transportation (Medical): Not on file  . Lack of Transportation (Non-Medical): Not on file  Physical Activity:   . Days of Exercise per Week: Not on file  . Minutes of Exercise per Session: Not on file  Stress:   . Feeling of Stress : Not on file  Social Connections:   . Frequency of Communication with Friends and Family: Not on file  . Frequency of Social Gatherings with Friends and Family: Not on file  . Attends Religious Services: Not on file  . Active Member of Clubs or Organizations: Not on file  . Attends Banker Meetings: Not on file  . Marital Status: Not on file  Intimate  Partner Violence:   . Fear of Current or Ex-Partner: Not on file  . Emotionally Abused: Not on file  . Physically Abused: Not on file  . Sexually Abused: Not on file    Family History  Problem Relation Age of Onset  . Hypertension Mother   . Hypertension Father   . Arrhythmia Father   . Diabetes Maternal Grandfather   . Diabetes Paternal Grandfather     Past Surgical History:  Procedure Laterality Date  . CHOLECYSTECTOMY      ROS: Review of Systems Negative except as stated above  PHYSICAL EXAM: BP 123/83   Pulse 69   Temp (!) 97.5 F (36.4 C)   Resp 16   Wt 296 lb 3.2 oz (134.4 kg)   LMP 03/23/2019   SpO2 100%   BMI 45.04 kg/m   Physical Exam  General appearance - alert, well appearing, and in no  distress Mental status - normal mood, behavior, speech, dress, motor activity, and thought processes Abdomen -normal bowel sounds, nondistended, soft, mild epigastric tenderness without guarding or rebound.  No hepatosplenomegaly  CMP Latest Ref Rng & Units 03/23/2019 11/12/2018 07/23/2017  Glucose 70 - 99 mg/dL 96 84 96  BUN 6 - 20 mg/dL 11 11 13   Creatinine 0.44 - 1.00 mg/dL ) 3.79(K 2.40  Sodium 135 - 145 mmol/L 139 141 141  Potassium 3.5 - 5.1 mmol/L 3.9 4.6 4.0  Chloride 98 - 111 mmol/L 104 100 104  CO2 22 - 32 mmol/L 25 26 27   Calcium 8.9 - 10.3 mg/dL 9.4 9.6 9.3  Total Protein 6.5 - 8.1 g/dL 7.8 7.3 -  Total Bilirubin 0.3 - 1.2 mg/dL 0.6 0.2 -  Alkaline Phos 38 - 126 U/L 65 80 -  AST 15 - 41 U/L 20 17 -  ALT 0 - 44 U/L 30 23 -   Lipid Panel     Component Value Date/Time   CHOL 157 11/12/2018 1508   TRIG 124 11/12/2018 1508   HDL 39 (L) 11/12/2018 1508   CHOLHDL 4.0 11/12/2018 1508   LDLCALC 96 11/12/2018 1508    CBC    Component Value Date/Time   WBC 10.5 03/23/2019 1517   RBC 5.09 03/23/2019 1517   HGB 14.0 03/23/2019 1517   HGB 13.2 11/12/2018 1508   HCT 43.4 03/23/2019 1517   HCT 41.2 11/12/2018 1508   PLT 324 03/23/2019 1517   PLT 342 11/12/2018 1508   MCV 85.3 03/23/2019 1517   MCV 86 11/12/2018 1508   MCH 27.5 03/23/2019 1517   MCHC 32.3 03/23/2019 1517   RDW 13.0 03/23/2019 1517   RDW 13.5 11/12/2018 1508   LYMPHSABS 1.1 04/04/2017 0859   MONOABS 0.5 04/04/2017 0859   EOSABS 0.0 04/04/2017 0859   BASOSABS 0.0 04/04/2017 0859    ASSESSMENT AND PLAN: 1. Gastroesophageal reflux disease without esophagitis GERD precautions discussed.  Increase omeprazole to 40 mg twice a day.  GI referral - omeprazole (PRILOSEC) 40 MG capsule; Take 1 capsule (40 mg total) by mouth 2 (two) times daily.  Dispense: 60 capsule; Refill: 1   Patient was given the opportunity to ask questions.  Patient verbalized understanding of the plan and was able to repeat key  elements of the plan.  04/06/2017 Orders Placed This Encounter  Procedures  . Ambulatory referral to Gastroenterology     Requested Prescriptions   Signed Prescriptions Disp Refills  . omeprazole (PRILOSEC) 40 MG capsule 60 capsule 1    Sig: Take 1 capsule (40  mg total) by mouth 2 (two) times daily.    Return in about 6 weeks (around 05/06/2019) for PAP.  Karle Plumber, MD, FACP

## 2019-04-09 ENCOUNTER — Encounter: Payer: Self-pay | Admitting: Gastroenterology

## 2019-04-13 ENCOUNTER — Telehealth: Payer: Self-pay | Admitting: Internal Medicine

## 2019-04-13 NOTE — Telephone Encounter (Signed)
Patient called saying she is having a lot of anxiety and has been eating a lot. Patient would like to know if PCP can prescribe her something. Please f/u

## 2019-04-13 NOTE — Telephone Encounter (Signed)
Please schedule pt a televisit to address

## 2019-04-20 ENCOUNTER — Encounter: Payer: Self-pay | Admitting: Gastroenterology

## 2019-04-20 ENCOUNTER — Ambulatory Visit (INDEPENDENT_AMBULATORY_CARE_PROVIDER_SITE_OTHER): Payer: Medicaid Other

## 2019-04-20 ENCOUNTER — Ambulatory Visit (INDEPENDENT_AMBULATORY_CARE_PROVIDER_SITE_OTHER): Payer: Medicaid Other | Admitting: Gastroenterology

## 2019-04-20 ENCOUNTER — Other Ambulatory Visit: Payer: Self-pay

## 2019-04-20 VITALS — BP 100/70 | HR 68 | Temp 98.9°F | Ht 68.0 in | Wt 298.0 lb

## 2019-04-20 DIAGNOSIS — K219 Gastro-esophageal reflux disease without esophagitis: Secondary | ICD-10-CM

## 2019-04-20 DIAGNOSIS — Z01818 Encounter for other preprocedural examination: Secondary | ICD-10-CM | POA: Insufficient documentation

## 2019-04-20 DIAGNOSIS — Z1159 Encounter for screening for other viral diseases: Secondary | ICD-10-CM

## 2019-04-20 DIAGNOSIS — K920 Hematemesis: Secondary | ICD-10-CM | POA: Diagnosis not present

## 2019-04-20 MED ORDER — FAMOTIDINE 40 MG PO TABS: 40.0000 mg | ORAL_TABLET | Freq: Every day | ORAL | 5 refills | Status: DC

## 2019-04-20 NOTE — Progress Notes (Signed)
04/20/2019 Cindy Dixon LIRIDONA MASHAW [id number] 21-Jan-1991   HISTORY OF PRESENT ILLNESS: This is a pleasant 29 year old female who is primarily Spanish-speaking.  The entire visit was performed via interpreter.  She is new to our office.  She is here today with complaints of severe acid reflux.  She says that she has a lot of burning in her stomach and it really bothers her at nighttime and acid reflux causes her to vomit.  She says that she has had acid reflux for about 7 years, but has certainly worsened with time.  She is on omeprazole 40 mg twice daily, she tells me just for the last few weeks.  Prior to that she was not using anything for her symptoms.  Thinks that it may be helping only very little so far.  She says that on occasion when she vomits she sees very small amounts of bright red blood.  Complains of upper abdominal pain, particularly in the epigastrium.  Denies NSAID use.  Denies dysphagia or odynophagia.  Says that she usually eats dinner around 5:00 PM so she has plenty time before lying down for bed.  Referred here by her PCP, Dr. Laural Benes.   Past Medical History:  Diagnosis Date  . Peptic ulcer disease    Past Surgical History:  Procedure Laterality Date  . CESAREAN SECTION     x2  . CHOLECYSTECTOMY      reports that she has never smoked. She has never used smokeless tobacco. She reports current alcohol use. She reports that she does not use drugs. family history includes Arrhythmia in her father; Diabetes in her maternal grandfather and paternal grandfather; Hypertension in her father and mother. No Known Allergies    Outpatient Encounter Medications as of 04/20/2019  Medication Sig  . acetaminophen (TYLENOL) 500 MG tablet Take 500 mg by mouth daily as needed (tooth pain).  Marland Kitchen omeprazole (PRILOSEC) 40 MG capsule Take 1 capsule (40 mg total) by mouth 2 (two) times daily.   No facility-administered encounter medications on file as of 04/20/2019.     REVIEW OF SYSTEMS  :  All other systems reviewed and negative except where noted in the History of Present Illness.   PHYSICAL EXAM: BP 100/70   Pulse 68   Temp 98.9 F (37.2 C)   Ht 5\' 8"  (1.727 m)   Wt 298 lb (135.2 kg)   LMP 03/23/2019   BMI 45.31 kg/m  General: Well developed hispanic female in no acute distress Head: Normocephalic and atraumatic Eyes:  Sclerae anicteric, conjunctiva pink. Ears: Normal auditory acuity Lungs: Clear throughout to auscultation; no increased WOB. Heart: Regular rate and rhythm; no M/R/G. Abdomen: Soft, non-distended.  BS present.  Upper abdominal TTP> in the epigastrium. Musculoskeletal: Symmetrical with no gross deformities  Skin: No lesions on visible extremities Extremities: No edema  Neurological: Alert oriented x 4, grossly non-focal Psychological:  Alert and cooperative. Normal mood and affect  ASSESSMENT AND PLAN: *GERD:  Severe for 7 years, but has worsened over time.  Now occurring regularly, every night, with almost every meal.  Causes vomiting with occasional small volume hematemesis on occasion.  Placed on omeprazole 40 mg BID just a few weeks ago, was not on anything prior to that.  Will add pepcid 40 mg at bedtime.  Prescription sent.  Due to the length and severity of symptoms will plan for EGD with Dr. 05/21/2019.    **The risks, benefits, and alternatives to EGD were discussed with the patient and she  consents to proceed.   CC:  Cindy Pier, MD

## 2019-04-20 NOTE — Patient Instructions (Addendum)
If you are age 31 or older, your body mass index should be between 23-30. Your Body mass index is 45.31 kg/m. If this is out of the aforementioned range listed, please consider follow up with your Primary Care Provider.  If you are age 26 or younger, your body mass index should be between 19-25. Your Body mass index is 45.31 kg/m. If this is out of the aformentioned range listed, please consider follow up with your Primary Care Provider.   You have been scheduled for an endoscopy. Please follow written instructions given to you at your visit today. If you use inhalers (even only as needed), please bring them with you on the day of your procedure.  Start Pepcid 40 mg nightly.

## 2019-04-21 LAB — SARS CORONAVIRUS 2 (TAT 6-24 HRS): SARS Coronavirus 2: NEGATIVE

## 2019-04-21 NOTE — Progress Notes (Signed)
Attending Physician's Attestation   I have reviewed the chart.   I agree with the Advanced Practitioner's note, impression, and recommendations with any updates as below.    La Shehan Mansouraty, MD [facility] Advanced Endoscopy Office # [phone]  

## 2019-04-22 ENCOUNTER — Encounter: Payer: Self-pay | Admitting: Gastroenterology

## 2019-04-22 ENCOUNTER — Ambulatory Visit (AMBULATORY_SURGERY_CENTER): Payer: Medicaid Other | Admitting: Gastroenterology

## 2019-04-22 ENCOUNTER — Other Ambulatory Visit: Payer: Self-pay

## 2019-04-22 VITALS — BP 138/72 | HR 71 | Temp 97.3°F | Resp 16 | Ht 68.0 in | Wt 298.0 lb

## 2019-04-22 DIAGNOSIS — K219 Gastro-esophageal reflux disease without esophagitis: Secondary | ICD-10-CM | POA: Diagnosis not present

## 2019-04-22 DIAGNOSIS — K209 Esophagitis, unspecified without bleeding: Secondary | ICD-10-CM | POA: Diagnosis not present

## 2019-04-22 DIAGNOSIS — K2970 Gastritis, unspecified, without bleeding: Secondary | ICD-10-CM

## 2019-04-22 DIAGNOSIS — K449 Diaphragmatic hernia without obstruction or gangrene: Secondary | ICD-10-CM

## 2019-04-22 DIAGNOSIS — K920 Hematemesis: Secondary | ICD-10-CM | POA: Diagnosis not present

## 2019-04-22 DIAGNOSIS — K2990 Gastroduodenitis, unspecified, without bleeding: Secondary | ICD-10-CM

## 2019-04-22 DIAGNOSIS — K2950 Unspecified chronic gastritis without bleeding: Secondary | ICD-10-CM | POA: Diagnosis not present

## 2019-04-22 MED ORDER — SODIUM CHLORIDE 0.9 % IV SOLN: 500.0000 mL | Freq: Once | INTRAVENOUS | Status: DC

## 2019-04-22 MED ORDER — SODIUM CHLORIDE 0.9 % IV SOLN
8.0000 mg | Freq: Once | INTRAVENOUS | Status: AC
  Administered 2019-04-22: 8 mg via INTRAVENOUS

## 2019-04-22 NOTE — Progress Notes (Signed)
Report to PACU, RN, vss, BBS= Clear.  

## 2019-04-22 NOTE — Patient Instructions (Addendum)
Information on hiatal hernias given to you today.  Await pathology results.  Continue Prilosec twice a day and Pepcid at bedtime.  Follow up in GI clinic in 4-8 weeks.  YOU HAD AN ENDOSCOPIC PROCEDURE TODAY AT THE [facility]:   Refer to the procedure report that was given to you for any specific questions about what was found during the examination.  If the procedure report does not answer your questions, please call your gastroenterologist to clarify.  If you requested that your care partner not be given the details of your procedure findings, then the procedure report has been included in a sealed envelope for you to review at your convenience later.  YOU SHOULD EXPECT: Some feelings of bloating in the abdomen. Passage of more gas than usual.  Walking can help get rid of the air that was put into your GI tract during the procedure and reduce the bloating. If you had a lower endoscopy (such as a colonoscopy or flexible sigmoidoscopy) you may notice spotting of blood in your stool or on the toilet paper. If you underwent a bowel prep for your procedure, you may not have a normal bowel movement for a few days.  Please Note:  You might notice some irritation and congestion in your nose or some drainage.  This is from the oxygen used during your procedure.  There is no need for concern and it should clear up in a day or so.  SYMPTOMS TO REPORT IMMEDIATELY:    Following upper endoscopy (EGD)  Vomiting of blood or coffee ground material  New chest pain or pain under the shoulder blades  Painful or persistently difficult swallowing  New shortness of breath  Fever of 100F or higher  Black, tarry-looking stools  For urgent or emergent issues, a gastroenterologist can be reached at any hour by calling [phone]. Do not use MyChart messaging for urgent concerns.    DIET:  We do recommend a small meal at first, but then you may proceed to your regular diet.  Drink plenty of  fluids but you should avoid alcoholic beverages for 24 hours.  ACTIVITY:  You should plan to take it easy for the rest of today and you should NOT DRIVE or use heavy machinery until tomorrow (because of the sedation medicines used during the test).    FOLLOW UP: Our staff will call the number listed on your records 48-72 hours following your procedure to check on you and address any questions or concerns that you may have regarding the information given to you following your procedure. If we do not reach you, we will leave a message.  We will attempt to reach you two times.  During this call, we will ask if you have developed any symptoms of COVID 19. If you develop any symptoms (ie: fever, flu-like symptoms, shortness of breath, cough etc.) before then, please call [phone].  If you test positive for Covid 19 in the 2 weeks post procedure, please call and report this information to [place].    If any biopsies were taken you will be contacted by phone or by letter within the next 1-3 weeks.  Please call us at [phone] if you have not heard about the biopsies in 3 weeks.    SIGNATURES/CONFIDENTIALITY: You and/or your care partner have signed paperwork which will be entered into your electronic medical record.  These signatures attest to the fact that that the information above on your After Visit Summary has been reviewed  and is understood.  Full responsibility of the confidentiality of this discharge information lies with you and/or your care-partner.USTED TUVO UN PROCEDIMIENTO ENDOSCPICO HOY EN EL [facility]:   Lea el informe del procedimiento que se le entreg para cualquier pregunta especfica sobre lo que se Dentist.  Si el informe del examen no responde a sus preguntas, por favor llame a su gastroenterlogo para aclararlo.  Si usted solicit que no se le den [employer] de lo que se Clinical cytogeneticist en su procedimiento al [employer] va a cuidar, entonces el  informe del procedimiento se ha incluido en un sobre sellado para que usted lo revise despus cuando le sea ms conveniente.   LO QUE PUEDE ESPERAR: Algunas sensaciones de hinchazn en el abdomen.  Puede tener ms gases de lo normal.  El caminar puede ayudarle a eliminar el aire que se le puso en el tracto gastrointestinal durante el procedimiento y reducir la hinchazn.  Si le hicieron una endoscopia inferior (como una colonoscopia o una sigmoidoscopia flexible), podra notar manchas de sangre en las heces fecales o en el papel higinico.  Si se someti a una preparacin intestinal para su procedimiento, es posible que no tenga una evacuacin intestinal normal durante [employer].   Tenga en cuenta:  Es posible que note un poco de irritacin y congestin en la nariz o algn drenaje.  Esto es debido al oxgeno [employer] durante su procedimiento.  No hay que preocuparse y esto debe desaparecer ms o Regulatory affairs officer.   SNTOMAS PARA REPORTAR INMEDIATAMENTE:  Despus de una endoscopia inferior (colonoscopia o sigmoidoscopia flexible):  Cantidades excesivas de sangre en las heces fecales  Sensibilidad significativa o empeoramiento de los dolores abdominales   Hinchazn aguda del abdomen que antes no tena   Fiebre de 100F o ms   Despus de la endoscopia superior (EGD)  Vmitos de Retail buyer o material como caf molido   Dolor en el pecho o dolor debajo de los omplatos que antes no tena   Dolor o dificultad persistente para tragar  Falta de aire que antes no tena   Fiebre de 100F o ms  Heces fecales negras y pegajosas   Para asuntos urgentes o de Associate Professor, puede comunicarse con un gastroenterlogo a cualquier hora llamando al [phone].  DIETA:  Recomendamos una comida pequea al principio, pero luego puede continuar con su dieta normal.  Tome muchos lquidos, Tax adviser las bebidas alcohlicas durante 24 horas.    ACTIVIDAD:  Debe planear tomarse las cosas con calma por el resto  del da y no debe CONDUCIR ni usar maquinaria pesada Patent examiner (debido a los medicamentos de sedacin utilizados durante el examen).     SEGUIMIENTO: Nuestro personal llamar al nmero que aparece en su historial al siguiente da hbil de su procedimiento para ver cmo se siente y para responder cualquier pregunta o inquietud que pueda tener con respecto a la informacin que se le dio despus del procedimiento. Si no podemos contactarle, le dejaremos un mensaje.  Sin embargo, si se siente bien y no tiene English as a second language teacher, no es necesario que nos devuelva la llamada.  Asumiremos que ha regresado a sus actividades diarias normales sin incidentes. Si se le tomaron algunas biopsias, le contactaremos por telfono o por carta en las prximas 3 semanas.  Si no ha sabido [employer] biopsias en el transcurso de 3 semanas, por favor llmenos al [phone].   FIRMAS/CONFIDENCIALIDAD: Daphane Shepherd y/o el acompaante que  le cuide han firmado documentos que se ingresarn en su historial mdico electrnico.  Estas firmas atestiguan el hecho de que la informacin anterior

## 2019-04-22 NOTE — Op Note (Signed)
[facility] Patient Name: Cindy Dixon Procedure Date: 04/22/2019 10:22 AM MRN: [id number] Endoscopist: Justice Britain , MD Age: 29 Referring MD:  Date of Birth: 08/06/1990 Gender: Female Account #: [id number] Procedure:                Upper GI endoscopy Indications:              Suspected gastro-esophageal reflux disease,                            Esophageal reflux symptoms that persist despite                            appropriate therapy Medicines:                Monitored Anesthesia Care Procedure:                Pre-Anesthesia Assessment:                           - Prior to the procedure, a History and Physical                            was performed, and patient medications and                            allergies were reviewed. The patient's tolerance of                            previous anesthesia was also reviewed. The risks                            and benefits of the procedure and the sedation                            options and risks were discussed with the patient.                            All questions were answered, and informed consent                            was obtained. Prior Anticoagulants: The patient has                            taken no previous anticoagulant or antiplatelet                            agents. ASA Grade Assessment: II - A patient with                            mild systemic disease. After reviewing the risks                            and benefits, the patient was deemed in  satisfactory condition to undergo the procedure.                           After obtaining informed consent, the endoscope was                            passed under direct vision. Throughout the                            procedure, the patient's blood pressure, pulse, and                            oxygen saturations were monitored continuously. The                            Endoscope was introduced through  the mouth, and                            advanced to the second part of duodenum. The upper                            GI endoscopy was accomplished without difficulty.                            The patient tolerated the procedure. Scope In: Scope Out: Findings:                 No gross lesions were noted in the entire                            esophagus. Biopsies were taken with a cold forceps                            for histology to rule out EoE.                           A 1 cm hiatal hernia was found. The proximal extent                            of the gastric folds (end of tubular esophagus) was                            39 cm from the incisors. The hiatal narrowing was                            40 cm from the incisors. The Z-line was 39 cm from                            the incisors.                           No gross lesions were noted in the entire examined  stomach. Biopsies were taken with a cold forceps                            for histology and Helicobacter pylori testing.                           No gross lesions were noted in the duodenal bulb,                            in the first portion of the duodenum and in the                            second portion of the duodenum. Biopsies were taken                            with a cold forceps for histology to rule out                            enteropathy/celiac. Complications:            No immediate complications. Estimated Blood Loss:     Estimated blood loss was minimal. Impression:               - No gross lesions in esophagus. Biopsied.                           - 1 cm hiatal hernia.                           - No gross lesions in the stomach. Biopsied.                           - No gross lesions in the duodenal bulb, in the                            first portion of the duodenum and in the second                            portion of the duodenum. Biopsied. Recommendation:            - The patient will be observed post-procedure,                            until all discharge criteria are met.                           - Discharge patient to home.                           - Patient has a contact number available for                            emergencies. The signs and symptoms of potential  delayed complications were discussed with the                            patient. Return to normal activities tomorrow.                            Written discharge instructions were provided to the                            patient.                           - Resume previous diet.                           - Continue present medications - PPI twice daily +                            Pepcid at night before bedtime.                           - Await pathology results.                           - Follow up in clinic in next 4-8 weeks. Transition                            PPI to a different PPI if symptoms are persisting.                            Would then recommend pH Impedence testing wiht                            Manometry to evaluate while off PPI therapy to see                            if still having issues even with 1 time PPI switch.                           - If pH Impedence testing is unremarkable then                            consider TCA use for Functional                            Heartburn/Esophageal Hypersensitivity.                           - The findings and recommendations were discussed                            with the patient. Justice Britain, MD 04/22/2019 10:51:00 AM

## 2019-04-22 NOTE — Progress Notes (Signed)
Called to room to assist during endoscopic procedure.  Patient ID and intended procedure confirmed with present staff. Received instructions for my participation in the procedure from the performing physician.  

## 2019-04-22 NOTE — Progress Notes (Signed)
Temperature taken by L.C., VS taken by D.T. 

## 2019-04-26 ENCOUNTER — Telehealth: Payer: Self-pay

## 2019-04-26 NOTE — Telephone Encounter (Signed)
  Follow up Call-  Call back number 04/22/2019  Post procedure Call Back phone  # [phone]  Permission to leave phone message Yes     Patient questions:  Do you have a fever, pain , or abdominal swelling? No. Pain Score  0 *  Have you tolerated food without any problems? Yes.    Have you been able to return to your normal activities? Yes.    Do you have any questions about your discharge instructions: Diet   No. Medications  No. Follow up visit  No.  Do you have questions or concerns about your Care? No.  Actions: * If pain score is 4 or above: 1. No action needed, pain <4.Have you developed a fever since your procedure? no  2.   Have you had an respiratory symptoms (SOB or cough) since your procedure? no  3.   Have you tested positive for COVID 19 since your procedure no  4.   Have you had any family members/close contacts diagnosed with the COVID 19 since your procedure?  no   If yes to any of these questions please route to Laverna Peace, RN and Jennye Boroughs, Charity fundraiser.

## 2019-05-04 ENCOUNTER — Encounter: Payer: Self-pay | Admitting: Gastroenterology

## 2019-05-15 DIAGNOSIS — Z20822 Contact with and (suspected) exposure to covid-19: Secondary | ICD-10-CM | POA: Diagnosis not present

## 2019-06-24 ENCOUNTER — Ambulatory Visit: Payer: Medicaid Other | Admitting: Gastroenterology

## 2019-07-02 ENCOUNTER — Encounter: Payer: Self-pay | Admitting: Gastroenterology

## 2019-08-03 ENCOUNTER — Encounter: Payer: Self-pay | Admitting: Gastroenterology

## 2019-08-03 ENCOUNTER — Ambulatory Visit: Payer: Medicaid Other | Admitting: Gastroenterology

## 2019-08-03 VITALS — BP 112/80 | HR 71 | Ht 69.0 in | Wt 319.5 lb

## 2019-08-03 DIAGNOSIS — R1013 Epigastric pain: Secondary | ICD-10-CM | POA: Diagnosis not present

## 2019-08-03 DIAGNOSIS — K219 Gastro-esophageal reflux disease without esophagitis: Secondary | ICD-10-CM

## 2019-08-03 MED ORDER — DEXLANSOPRAZOLE 60 MG PO CPDR: 60.0000 mg | DELAYED_RELEASE_CAPSULE | Freq: Every day | ORAL | 5 refills | Status: DC

## 2019-08-03 NOTE — Patient Instructions (Signed)
If you are age 31 or older, your body mass index should be between 23-30. Your Body mass index is 47.18 kg/m. If this is out of the aforementioned range listed, please consider follow up with your Primary Care Provider.  If you are age 19 or younger, your body mass index should be between 19-25. Your Body mass index is 47.18 kg/m. If this is out of the aformentioned range listed, please consider follow up with your Primary Care Provider.   We have sent the following medications to your pharmacy for you to pick up at your convenience: Dexilant 60 mg daily.   Follow up in 4-6 weeks.    Enfermedad de reflujo gastroesofgico en los adultos Gastroesophageal Reflux Disease, Adult El reflujo gastroesofgico (RGE) ocurre cuando el cido del estmago sube por el tubo que conecta la boca con el estmago (esfago). Normalmente, la comida baja por el esfago y se mantiene en el estmago, donde se la digiere. Cuando una persona tiene RGE, los alimentos y el cido estomacal suelen volver al esfago. Usted puede tener una enfermedad llamada enfermedad de reflujo gastroesofgico (ERGE) si el reflujo:  Sucede a menudo.  Causa sntomas frecuentes o muy intensos.  Causa problemas tales como dao en el esfago. Cuando esto ocurre, el esfago duele y se hincha (inflama). Con el tiempo, la ERGE puede ocasionar pequeos agujeros (lceras) en el revestimiento del esfago. Cules son las causas? Esta afeccin se debe a un problema en el msculo que se encuentra entre el esfago y [place]. Cuando este msculo est dbil o no es normal, no se cierra correctamente para impedir que los alimentos y el cido regresen del Paramedic. El msculo puede debilitarse debido a lo siguiente:  El consumo de [place].  [place].  Tener cierto tipo de hernia (hernia de hiato).  Consumo de alcohol.  Ciertos alimentos y bebidas, como caf, chocolate, cebollas y [place]. Qu incrementa el riesgo? Es ms probable que tenga  esta afeccin si:  Tiene sobrepeso.  Tiene una enfermedad que afecta el tejido conjuntivo.  [place] antiinflamatorios no esteroideos (AINE). Cules son los signos o los sntomas? Los sntomas de esta afeccin incluyen:  Acidez estomacal.  Dificultad o dolor al tragar.  Sensacin de Best boy un bulto en la garganta.  Sabor amargo en la boca.  Mal aliento.  Tener una gran cantidad de saliva.  Estmago inflamado o con Tree surgeon.  Eructos.  Dolor en el pecho. El dolor de pecho puede deberse a distintas afecciones. Asegrese de Teacher, adult education a su mdico si tiene Tourist information centre manager.  Falta de aire o respiracin ruidosa (sibilancias).  Tos constante (crnica) o durante la noche.  Desgaste de la superficie de los dientes (esmalte dental).  Prdida de peso. Cmo se trata? El tratamiento depender de la gravedad de los sntomas. El mdico puede sugerirle lo siguiente:  Cambios en la dieta.  Medicamentos.  Clementeen Hoof. Siga estas indicaciones en su casa: Comida y bebida   Siga una dieta como se lo haya indicado el mdico. Es posible que deba evitar alimentos y bebidas, por ejemplo: ? Caf y t (con o sin cafena). ? Bebidas que contengan alcohol. ? Bebidas energticas y deportivas. ? Bebidas gaseosas y refrescos. ? Chocolate y cacao. ? Menta y [place]. ? Ajo y cebolla. ? Rbano picante. ? Alimentos cidos y condimentados. Estos incluyen todos los tipos de pimientos, [place] en polvo, curry en polvo, vinagre, salsas picantes y [employer]. ? Ctricos y sus jugos, por ejemplo, naranjas, limones y limas. ?  Alimentos que [employer]. Estos incluyen salsa roja, [place], salsa picante y pizza con salsa de [place]. ? Alimentos fritos y Lexicographer. Estos incluyen donas, papas fritas, papitas fritas de bolsa y aderezos con alto contenido de [place] ? Carnes con alto contenido de [place] Estas incluye los perros calientes, chuletas o costillas, embutidos, jamn y  tocino. ? Productos lcteos ricos en grasas, como leche [place], [place] y [place] crema.  Consuma pequeas cantidades de comida con ms frecuencia. Evite consumir porciones abundantes.  Evite beber grandes cantidades de lquidos con las comidas.  Evite comer 2 o 3horas antes de acostarse.  Evite recostarse inmediatamente despus de comer.  No haga ejercicios enseguida despus de comer. Estilo de vida   No consuma ningn producto que contenga nicotina o tabaco. Estos incluyen cigarrillos, cigarrillos electrnicos y tabaco para Theatre manager. Si necesita ayuda para dejar de fumar, consulte al [employer] Express.  Intente reducir [employer] de estrs. Si necesita ayuda para hacer esto, consulte al mdico.  Si tiene sobrepeso, baje una cantidad de peso saludable para usted. Consulte a su mdico para bajar de peso de [employer]. Indicaciones generales  Est atento a cualquier cambio en los sntomas.  Tome los medicamentos de venta libre y los recetados solamente como se lo haya indicado el mdico. No tome aspirina, ibuprofeno ni otros AINE a menos que el mdico lo autorice.  Use ropa holgada. No use nada apretado alrededor de la cintura.  Levante (eleve) la cabecera de la cama aproximadamente 6pulgadas (15cm).  Evite inclinarse si al hacerlo empeoran los sntomas.  Concurra a todas las visitas de [address] se lo haya indicado el mdico. Esto es importante. Comunquese con un mdico si:  Aparecen nuevos sntomas.  Adelgaza y no sabe por qu.  Tiene problemas para tragar o le duele cuando traga.  Tiene sibilancias o tos persistente.  Los sntomas no mejoran con Scientist, research (medical).  Tiene la voz ronca. Solicite ayuda inmediatamente si:  [employer], el cuello, la [place], los dientes o la espalda.  Se siente transpirado, mareado o tiene una sensacin de desvanecimiento.  Siente falta de aire o Journalist, newspaper.  Vomita y el vmito tiene un aspecto similar a la sangre  o a los posos de caf.  Pierde el conocimiento (se desmaya).  Las deposiciones (heces) son sanguinolentas o negras.  No puede tragar, beber o comer. Resumen  Si una persona tiene enfermedad de reflujo gastroesofgico (ERGE), los alimentos y el cido estomacal suben al esfago y causan sntomas o problemas tales como dao en el esfago.  El tratamiento depender de la gravedad de los sntomas.  Siga una Air traffic controller se lo haya indicado el mdico.  Tome todos los medicamentos solamente como se lo haya indicado el mdico. Esta informacin no tiene Theme park manager el consejo del mdico. Asegrese de hacerle al mdico cualquier pregunta que tenga. Document Revised: 09/11/2017 Document Reviewed: 09/11/2017 Elsevier Patient Education  2020 [employer].

## 2019-08-03 NOTE — Progress Notes (Signed)
08/03/2019 [name] M[facility] [id number] 1991/01/25   HISTORY OF PRESENT ILLNESS: This is a 29 year old female who is here for follow-up of her GERD.  She was seen by me on April 20, 2019 and scheduled for an EGD with Dr. Meridee Score.  She had that performed on March 11.  That showed only a 1 cm hiatal hernia.  Plan was to maintain on her twice daily omeprazole 40 mg and Pepcid at bedtime and then follow-up if that was not helping then to switch PPI and if after one-time switch there was no further improvement then to do pH with impedance and esophageal manometry off of her PPI therapy.  She is here today for follow-up after her procedure.  She is no longer on any acid reducing medications.  She says that the medicine she was on previously was not helping so she is no longer taking it.  She says that the Pepcid at bedtime made her nauseous.  She says that Tums don't help.  She has tried lots of other remedies without improvement.  She says that she has heartburn/reflux all day, every day.  Constant epigastric burning sensation.  She admits that she does drink a moderate amount of coffee.  Patient is primarily Spanish-speaking so the visit was performed via interpreter/translator.   Past Medical History:  Diagnosis Date  . GERD (gastroesophageal reflux disease)   . Peptic ulcer disease    Past Surgical History:  Procedure Laterality Date  . CESAREAN SECTION     x2  . CHOLECYSTECTOMY      reports that she has never smoked. She has never used smokeless tobacco. She reports current alcohol use. She reports that she does not use drugs. family history includes Arrhythmia in her father; Diabetes in her maternal grandfather and paternal grandfather; Hypertension in her father and mother. No Known Allergies    Outpatient Encounter Medications as of 08/03/2019  Medication Sig  . acetaminophen (TYLENOL) 500 MG tablet Take 500 mg by mouth daily as needed (tooth pain).  . [DISCONTINUED]  famotidine (PEPCID) 40 MG tablet Take 1 tablet (40 mg total) by mouth at bedtime.  . [DISCONTINUED] omeprazole (PRILOSEC) 40 MG capsule Take 1 capsule (40 mg total) by mouth 2 (two) times daily.   No facility-administered encounter medications on file as of 08/03/2019.     REVIEW OF SYSTEMS  : All other systems reviewed and negative except where noted in the History of Present Illness.   PHYSICAL EXAM: BP 112/80   Pulse 71   Ht 5\' 9"  (1.753 m)   Wt (!) 319 lb 8 oz (144.9 kg)   BMI 47.18 kg/m  General: Well developed white female in no acute distress Head: Normocephalic and atraumatic Eyes:  Sclerae anicteric, conjunctiva pink. Ears: Normal auditory acuity Lungs: Clear throughout to auscultation; no increased WOB Heart: Regular rate and rhythm; no M/R/G. Abdomen: Soft, non-distended.  BS present.  Mild upper abdominal TTP. Musculoskeletal: Symmetrical with no gross deformities  Skin: No lesions on visible extremities Extremities: No edema  Neurological: Alert oriented x 4, grossly non-focal Psychological:  Alert and cooperative. Normal mood and affect  ASSESSMENT AND PLAN: *GERD and epigastric pain: EGD in March with only a 1 cm hiatal hernia.  Now complaining of heartburn/reflux all day, every day, but is no longer any acid reducing medications.  She says that what she was on previously did not help.  Plan was to switch PPI.  We'll try for Dexilant 60 mg daily.  She  says that Pepcid made her nauseous.  Was previously on omeprazole 40 mg twice daily.  We also discussed antireflux diet and she was given literature in [place].  I will see her back in 4 to 6 weeks.  If no improvement with dietary modification and Dexilant therapy then we'll plan for 24-hour pH with impedance and esophageal manometry off of her PPI per Dr. Rush Landmark.   CC:  Ladell Pier, MD

## 2019-08-04 NOTE — Progress Notes (Signed)
Attending Physician's Attestation   I have reviewed the chart.   I agree with the Advanced Practitioner's note, impression, and recommendations with any updates as below.    Lotus Santillo Mansouraty, MD [facility] Office # [phone]  

## 2019-08-10 DIAGNOSIS — H5213 Myopia, bilateral: Secondary | ICD-10-CM | POA: Diagnosis not present

## 2019-08-10 DIAGNOSIS — H1013 Acute atopic conjunctivitis, bilateral: Secondary | ICD-10-CM | POA: Diagnosis not present

## 2019-09-07 ENCOUNTER — Ambulatory Visit: Payer: Self-pay | Admitting: Gastroenterology

## 2019-09-15 ENCOUNTER — Telehealth: Payer: Self-pay | Admitting: General Surgery

## 2019-09-15 ENCOUNTER — Encounter: Payer: Self-pay | Admitting: Nurse Practitioner

## 2019-09-15 ENCOUNTER — Ambulatory Visit: Payer: Medicaid Other | Admitting: Nurse Practitioner

## 2019-09-15 VITALS — BP 122/78 | HR 80 | Ht 68.0 in | Wt 319.8 lb

## 2019-09-15 DIAGNOSIS — K219 Gastro-esophageal reflux disease without esophagitis: Secondary | ICD-10-CM | POA: Diagnosis not present

## 2019-09-15 DIAGNOSIS — E049 Nontoxic goiter, unspecified: Secondary | ICD-10-CM | POA: Diagnosis not present

## 2019-09-15 NOTE — Progress Notes (Signed)
09/15/2019 Cindy Dixon [id number] 04/11/1990   CHIEF COMPLAINT: worsening acid reflux   HISTORY OF PRESENT ILLNESS: Cindy Dixon is a 29 year old female with a past medical history of morbid obesity and GERD.  Past cholecystectomy secondary to gallstones approximately 8 years ago while living in [place].She was last seen in our office by Doug Sou, PA-C on 08/03/2019.  At that time, she was not taking a PPI.  She was prescribed Dexilant 60 mg once daily with a discussion of scheduling a 24-hour pH monitoring study and esophageal manometry off PPI if no improvement.  Shee previously underwent an EGD 04/22/2019 by Dr. Meridee Score which identified a 1 cm hiatal hernia and chronic inactive gastritis.  No evidence of Helicobacter pylori.  Esophageal biopsies showed minimally inflamed squamous mucosa.  She presents today accompanied by [facility] staff to interpret.  The patient speaks Spanish.  Her nighttime reflux symptoms significantly improved after taking Dexilant at bedtime.  However, she continues to have significant acid reflux during the daytime which occurs randomly and usually after eating any food.  She has headaches associated with times of worsening reflux.  Approximately 3 weeks ago, she awakened from sleep with epigastric pain and nausea which was similar to the pain she had prior to her cholecystectomy.  This pain episode lasted less than 10 minutes without recurrence.  She denies NSAID use.  She drinks a large cup of coffee every morning.  She has occasional constipation but primarily passes a normal formed brown bowel movement most days.  No diarrhea.  No rectal bleeding or melena.  She eats dinner at 5 PM during the weekdays and eats a later dinner on the weekends.  She has gained 21 pounds since March 2021 which is most likely contributing to her symptoms.   CBC Latest Ref Rng & Units 03/23/2019 11/12/2018 07/23/2017  WBC 4.0 - 10.5 K/uL 10.5 11.4(H) 9.1  Hemoglobin 12.0 -  15.0 g/dL 03.5 59.7 41.6  Hematocrit 36 - 46 % 43.4 41.2 41.9  Platelets 150 - 400 K/uL 324 342 330   CMP Latest Ref Rng & Units 03/23/2019 11/12/2018 07/23/2017  Glucose 70 - 99 mg/dL 96 84 96  BUN 6 - 20 mg/dL 11 11 13   Creatinine 0.44 - 1.00 mg/dL ) 3.84(T 3.64  Sodium 135 - 145 mmol/L 139 141 141  Potassium 3.5 - 5.1 mmol/L 3.9 4.6 4.0  Chloride 98 - 111 mmol/L 104 100 104  CO2 22 - 32 mmol/L 25 26 27   Calcium 8.9 - 10.3 mg/dL 9.4 9.6 9.3  Total Protein 6.5 - 8.1 g/dL 7.8 7.3 -  Total Bilirubin 0.3 - 1.2 mg/dL 0.6 0.2 -  Alkaline Phos 38 - 126 U/L 65 80 -  AST 15 - 41 U/L 20 17 -  ALT 0 - 44 U/L 30 23 -    EGD 04/22/2019: - No gross lesions in esophagus. Biopsied. - 1 cm hiatal hernia. - No gross lesions in the stomach. Biopsied. - No gross lesions in the duodenal bulb, in the first portion of the duodenum and in the second portion of the duodenum. Biopsied.  1. Surgical [P], random duodenal sites - BENIGN SMALL BOWEL MUCOSA. - NO ACTIVE INFLAMMATION OR VILLOUS ATROPHY IDENTIFIED. 2. Surgical [P], random gastric sites - CHRONIC INACTIVE GASTRITIS, MILD. - THERE IS NO EVIDENCE OF HELICOBACTER PYLORI, DYSPLASIA, OR MALIGNANCY. - SEE COMMENT. 3. Surgical [P], random esophageal sites - MINIMALLY INFLAMED SQUAMOUS MUCSOA. - THERE IS NO EVIDENCE OF SIGNIFICANT INCREASE IN  EOSINOPHILS, DYSPLASIA, OR MALIGNANCY.   No Known Allergies    Outpatient Encounter Medications as of 09/15/2019  Medication Sig  . acetaminophen (TYLENOL) 500 MG tablet Take 500 mg by mouth daily as needed (tooth pain).  Marland Kitchen dexlansoprazole (DEXILANT) 60 MG capsule Take 1 capsule (60 mg total) by mouth daily.   No facility-administered encounter medications on file as of 09/15/2019.   REVIEW OF SYSTEMS: All other systems reviewed and negative except where noted in the History of Present Illness.  PHYSICAL EXAM: BP 122/78   Pulse 80   Ht 5\' 8"  (1.727 m)   Wt (!) 319 lb 12.8 oz (145.1 kg)   BMI 48.63  kg/m    Wt Readings from Last 3 Encounters:  09/15/19 (!) 319 lb 12.8 oz (145.1 kg)  08/03/19 (!) 319 lb 8 oz (144.9 kg)  04/22/19 298 lb (135.2 kg)    General: Obese 29 year old female in no acute distress. Head: Normocephalic and atraumatic. Eyes:  Sclerae non-icteric, conjunctive pink. Ears: Normal auditory acuity. Mouth: Dentition intact. No ulcers or lesions.  Neck: Supple, thyroid enlarged on exam with a questionable right thyroid nodule (challenging exam on obese patient). Lungs: Clear bilaterally to auscultation without wheezes, crackles or rhonchi. Heart: Regular rate and rhythm. No murmur, rub or gallop appreciated.  Abdomen: Soft, nontender, non distended. No masses. No hepatosplenomegaly. Normoactive bowel sounds x 4 quadrants.  Rectal: Deferred. Musculoskeletal: Symmetrical with no gross deformities. Skin: Warm and dry. No rash or lesions on visible extremities. Extremities: No edema. Neurological: Alert oriented x 4, no focal deficits.  Psychological:  Alert and cooperative. Normal mood and affect.  ASSESSMENT AND PLAN:  75. 29 year old female with persistent GERD symptoms. -Continue Dexilant 60 mg once daily for now -Gaviscon 1 tablespoon 3 times daily prior to meals -Fasting gastrin level -Discussed scheduling a 24-hour pH monitor study, await further recommendations from Dr. 26  2.  Morbid obesity. BMI 48.6. -Refer to [facility] wellness and weight management center.  Weight loss is paramount.  She may require future bariatric surgery. -Discussed worsening obesity with continued weight gain increasing her personal risk of diabetes, heart disease and metabolic syndrome.  Her obesity is most likely attributing to her active GERD symptoms as well.  3.  Questional thyromegaly -Thyroid ultrasound -TSH  4. Elevated creatinine level -CMP    CC:  [facility], MD

## 2019-09-15 NOTE — Telephone Encounter (Signed)
Scheduled the patient for an Thyroid US at [facility] 09/22/2019 @2pm .

## 2019-09-15 NOTE — Patient Instructions (Addendum)
If you are age 31 or older, your body mass index should be between 23-30. Your Body mass index is 48.63 kg/m. If this is out of the aforementioned range listed, please consider follow up with your Primary Care Provider.  If you are age 77 or younger, your body mass index should be between 19-25. Your Body mass index is 48.63 kg/m. If this is out of the aformentioned range listed, please consider follow up with your Primary Care Provider.   We will schedule you for a thyroid scan.  Gaviscon 1 tbsp three times a day, take 20 minutes before each meal Continue Dexilant 60mg  dailly Follow up in 4 weeks  Referral to [facility] weight loss clinic, if you do not hear from them within 1 week please contact our office at [phone]  Due to recent changes in healthcare laws, you may see the results of your imaging and laboratory studies on MyChart before your provider has had a chance to review them.  We understand that in some cases there may be results that are confusing or concerning to you. Not all laboratory results come back in the same time frame and the provider may be waiting for multiple results in order to interpret others.  Please give [phone] 48 hours in order for your provider to thoroughly review all the results before contacting the office for clarification of your results.

## 2019-09-15 NOTE — Telephone Encounter (Signed)
Per Aurelio Brash, he contacted the patient and she needs to have this appointment moved to the week of 09/27/2019

## 2019-09-16 NOTE — Progress Notes (Signed)
Attending Physician's Attestation   I have reviewed the chart.   I agree with the Advanced Practitioner's note, impression, and recommendations with any updates as below.  Would allow trial of additional Gaviscon and hope of improvement in BMI before pH impedence testing to rule out a concomittant Hypersensitivity esophagus.  At planned follow up, if still having issues then can proceed with ordering.  Would get a manometry with the testing to ensure no evidence of dysmotility, if a GERD-related surgery were to ever be considered in the future.   Corliss Parish, MD [facility] Office # [phone]

## 2019-09-16 NOTE — Telephone Encounter (Signed)
Spoke with pt and informed her of appt for US Thyroid, pt voiced understanding on appt date and time

## 2019-09-16 NOTE — Telephone Encounter (Signed)
Spoke with Tasha at central scheduling patients US of the abd/pelvis has been rescheduled to 09/29/2019 @2 :00. Message sent to to contact the patient with new time.

## 2019-09-22 ENCOUNTER — Ambulatory Visit (HOSPITAL_COMMUNITY): Payer: Medicaid Other

## 2019-09-29 ENCOUNTER — Ambulatory Visit (HOSPITAL_COMMUNITY): Admission: RE | Admit: 2019-09-29 | Payer: Medicaid Other | Source: Ambulatory Visit

## 2019-10-11 ENCOUNTER — Encounter (INDEPENDENT_AMBULATORY_CARE_PROVIDER_SITE_OTHER): Payer: Self-pay | Admitting: Bariatrics

## 2019-10-11 ENCOUNTER — Other Ambulatory Visit: Payer: Self-pay

## 2019-10-11 ENCOUNTER — Ambulatory Visit (INDEPENDENT_AMBULATORY_CARE_PROVIDER_SITE_OTHER): Payer: Medicaid Other | Admitting: Bariatrics

## 2019-10-11 VITALS — BP 122/86 | HR 66 | Temp 97.7°F | Ht 68.0 in | Wt 321.0 lb

## 2019-10-11 DIAGNOSIS — R7309 Other abnormal glucose: Secondary | ICD-10-CM

## 2019-10-11 DIAGNOSIS — R0602 Shortness of breath: Secondary | ICD-10-CM

## 2019-10-11 DIAGNOSIS — D508 Other iron deficiency anemias: Secondary | ICD-10-CM

## 2019-10-11 DIAGNOSIS — F509 Eating disorder, unspecified: Secondary | ICD-10-CM | POA: Diagnosis not present

## 2019-10-11 DIAGNOSIS — Z6841 Body Mass Index (BMI) 40.0 and over, adult: Secondary | ICD-10-CM | POA: Diagnosis not present

## 2019-10-11 DIAGNOSIS — R5383 Other fatigue: Secondary | ICD-10-CM

## 2019-10-11 DIAGNOSIS — K219 Gastro-esophageal reflux disease without esophagitis: Secondary | ICD-10-CM | POA: Diagnosis not present

## 2019-10-11 DIAGNOSIS — Z1331 Encounter for screening for depression: Secondary | ICD-10-CM | POA: Diagnosis not present

## 2019-10-11 DIAGNOSIS — E559 Vitamin D deficiency, unspecified: Secondary | ICD-10-CM | POA: Diagnosis not present

## 2019-10-11 DIAGNOSIS — Z0289 Encounter for other administrative examinations: Secondary | ICD-10-CM

## 2019-10-11 NOTE — Progress Notes (Signed)
Interpreter, Cindy Dixon, was present with patient throughout the visit.  Chief Complaint:   OBESITY Cindy Dixon (MR# [id number]) is a 29 y.o. female who presents for evaluation and treatment of obesity and related comorbidities. Current BMI is Body mass index is 48.81 kg/m.Marland Kitchen Cindy Dixon has been struggling with her weight for many years and has been unsuccessful in either losing weight, maintaining weight loss, or reaching her healthy weight goal.  Cindy Dixon is currently in the action stage of change and ready to dedicate time achieving and maintaining a healthier weight. Cindy Dixon is interested in becoming our patient and working on intensive lifestyle modifications including (but not limited to) diet and exercise for weight loss.  Cindy Dixon does like to cook, but states that she tends to eat fatty foods.  Cindy Dixon habits were reviewed today and are as follows: Her family eats meals together, she thinks her family will eat healthier with her, her desired weight loss is 161 lbs, she started gaining weight at the age of 25, her heaviest weight ever was 321 pounds, she craves snacks, she snacks frequently in the evenings, she is frequently drinking liquids with calories, she frequently makes poor food choices, she has problems with excessive hunger, she frequently eats larger portions than normal and she struggles with emotional eating.  Depression Screen Cindy Dixon Food and Mood (modified PHQ-9) score was 24.  Depression screen PHQ 2/9 10/11/2019  Decreased Interest 3  Down, Depressed, Hopeless 3  PHQ - 2 Score 6  Altered sleeping 3  Tired, decreased energy 3  Change in appetite 3  Feeling bad or failure about yourself  3  Trouble concentrating 3  Moving slowly or fidgety/restless 2  Suicidal thoughts 1  PHQ-9 Score 24  Difficult doing work/chores Extremely dIfficult   Subjective:   Other fatigue. Cindy Dixon denies daytime somnolence and admits to waking up still tired.  Patent has a history of symptoms of morning headache. Cindy Dixon generally gets 7 hours of sleep per night, and states that she sometimes has generally restful sleep. Snoring is present. Apneic episodes are not present. Epworth Sleepiness Score is 4.  SOB (shortness of breath) on exertion. Cindy Dixon notes increasing shortness of breath with certain activities and seems to be worsening over time with weight gain. She notes getting out of breath sooner with activity than she used to. This has gotten worse recently. Cindy Dixon denies shortness of breath at rest or orthopnea.  Gastroesophageal reflux disease, unspecified whether esophagitis present. Cindy Dixon is taking Dexilant.  Elevated glucose. Cindy Dixon denies excessive appetite.  Other iron deficiency anemia. Cindy Dixon reports decreased fatigue.  CBC Latest Ref Rng & Units 03/23/2019 11/12/2018 07/23/2017  WBC 4.0 - 10.5 K/uL 10.5 11.4(H) 9.1  Hemoglobin 12.0 - 15.0 g/dL 24.4 01.0 27.2  Hematocrit 36 - 46 % 43.4 41.2 41.9  Platelets 150 - 400 K/uL 324 342 330   No results found for: IRON, TIBC, FERRITIN No results found for: VITAMINB12  Eating disorder, unspecified type. Cindy Dixon denies suicidal or homicidal ideations.  Depression screening. Cindy Dixon has a strongly positive depression screen with a PHQ-9 score of 24.  Assessment/Plan:   Other fatigue. Cindy Dixon does feel that her weight is causing her energy to be lower than it should be. Fatigue may be related to obesity, depression or many other causes. Labs will be ordered, and in the meanwhile, Cindy Dixon will focus on self care including making healthy food choices, increasing physical activity and focusing on stress reduction. EKG 12-Lead, CBC with Differential/Platelet, Comprehensive metabolic panel, Hemoglobin A1c,  Insulin, random, Lipid Panel With LDL/HDL Ratio, T3, T4, free, TSH, VITAMIN D 25 Hydroxy (Vit-D Deficiency, Fractures) testing ordered today.  SOB (shortness of  breath) on exertion. Cindy Dixon does feel that she gets out of breath more easily that she used to when she exercises. Cindy Dixon's shortness of breath appears to be obesity related and exercise induced. She has agreed to work on weight loss and gradually increase exercise to treat her exercise induced shortness of breath. Will continue to monitor closely.   Gastroesophageal reflux disease, unspecified whether esophagitis present. Intensive lifestyle modifications are the first line treatment for this issue. We discussed several lifestyle modifications today and she will continue to work on diet, exercise and weight loss efforts. Orders and follow up as documented in patient record. Cindy Dixon will continue her medication as directed, avoid triggers, and follow-up with Gastroenterology as scheduled and as directed.   Counseling . If a person has gastroesophageal reflux disease (GERD), food and stomach acid move back up into the esophagus and cause symptoms or problems such as damage to the esophagus. . Anti-reflux measures include: raising the head of the bed, avoiding tight clothing or belts, avoiding eating late at night, not lying down shortly after mealtime, and achieving weight loss. . Avoid ASA, NSAID's, caffeine, alcohol, and tobacco.  . OTC Pepcid and/or Tums are often very helpful for as needed use.  Marland Kitchen. However, for persisting chronic or daily symptoms, stronger medications like Omeprazole may be needed. . You may need to avoid foods and drinks such as: ? Coffee and tea (with or without caffeine). ? Drinks that contain alcohol. ? Energy drinks and sports drinks. ? Bubbly (carbonated) drinks or sodas. ? Chocolate and cocoa. ? Peppermint and mint flavorings. ? Garlic and onions. ? Horseradish. ? Spicy and acidic foods. These include peppers, chili powder, curry powder, vinegar, hot sauces, and BBQ sauce. ? Citrus fruit juices and citrus fruits, such as oranges, lemons, and  limes. ? Tomato-based foods. These include red sauce, chili, salsa, and pizza with red sauce. ? Fried and fatty foods. These include donuts, french fries, potato chips, and high-fat dressings. ? High-fat meats. These include hot dogs, rib eye steak, sausage, ham, and bacon.  Elevated glucose.  Hemoglobin A1c, Insulin, random levels will be checked today.  Other iron deficiency anemia. Orders and follow up as documented in patient record. CBC with Differential/Platelet will be checked today.  Counseling . Iron is essential for our bodies to make red blood cells.  Reasons that someone may be deficient include: an iron-deficient diet (more likely in those following vegan or vegetarian diets), women with heavy menses, patients with GI disorders or poor absorption, patients that have had bariatric surgery, frequent blood donors, patients with cancer, and patients with heart disease.   Marland Kitchen. An iron supplement has been recommended. This is found over-the-counter.   Cindy Dixon. Iron-rich foods include dark leafy greens, red and white meats, eggs, seafood, and beans.   . Certain foods and drinks prevent your body from absorbing iron properly. Avoid eating these foods in the same meal as iron-rich foods or with iron supplements. These foods include: coffee, black tea, and red wine; milk, dairy products, and foods that are high in calcium; beans and soybeans; whole grains.  . Constipation can be a side effect of iron supplementation. Increased water and fiber intake are helpful. Water goal: > 2 liters/day. Fiber goal: > 25 grams/day.   Eating disorder, unspecified type. Handout was provided on Emotional Eating.  Depression screening. Cindy Dixon  had a positive depression screening. Depression is commonly associated with obesity and often results in emotional eating behaviors. We will monitor this closely and work on CBT to help improve the non-hunger eating patterns. Referral to Psychology may be required if no improvement  is seen as she continues in our clinic.  Class 3 severe obesity with serious comorbidity and body mass index (BMI) of 45.0 to 49.9 in adult, unspecified obesity type (HCC).  Cindy Dixon is currently in the action stage of change and her goal is to continue with weight loss efforts. I recommend Cindy Dixon begin the structured treatment plan as follows:  She has agreed to the Category 3 Plan.  She will work on meal planning, intentional eating, and decreasing portion sizes.  Exercise goals: All adults should avoid inactivity. Some physical activity is better than none, and adults who participate in any amount of physical activity gain some health benefits.   Behavioral modification strategies: increasing lean protein intake, decreasing simple carbohydrates, increasing vegetables, increasing water intake, decreasing eating out, no skipping meals, meal planning and cooking strategies, keeping healthy foods in the home and planning for success.  She was informed of the importance of frequent follow-up visits to maximize her success with intensive lifestyle modifications for her multiple health conditions. She was informed we would discuss her lab results at her next visit unless there is a critical issue that needs to be addressed sooner. Cindy Dixon agreed to keep her next visit at the agreed upon time to discuss these results.  Objective:   Blood pressure 122/86, pulse 66, temperature 97.7 F (36.5 C), temperature source Oral, height 5\' 8"  (1.727 m), weight (!) 321 lb (145.6 kg), SpO2 100 %. Body mass index is 48.81 kg/m.  EKG: Sinus  Bradycardia with a rate of 56 BPM - With rate variation; cv = 11. RSR(V1) - nondiagnostic. Probably normal.   Indirect Calorimeter completed today shows a VO2 of 305 and a REE of 2121.  Her calculated basal metabolic rate is 2122 thus her basal metabolic rate is worse than expected.  General: Cooperative, alert, well developed, in no acute distress. HEENT:  Conjunctivae and lids unremarkable. Cardiovascular: Regular rhythm.  Lungs: Normal work of breathing. Neurologic: No focal deficits.   Lab Results  Component Value Date   CREATININE 1.15 (H) 03/23/2019   BUN 11 03/23/2019   NA 139 03/23/2019   K 3.9 03/23/2019   CL 104 03/23/2019   CO2 25 03/23/2019   Lab Results  Component Value Date   ALT 30 03/23/2019   AST 20 03/23/2019   ALKPHOS 65 03/23/2019   BILITOT 0.6 03/23/2019   Lab Results  Component Value Date   HGBA1C 5.7 (H) 11/12/2018   No results found for: INSULIN Lab Results  Component Value Date   TSH 2.199 07/23/2017   Lab Results  Component Value Date   CHOL 157 11/12/2018   HDL 39 (L) 11/12/2018   LDLCALC 96 11/12/2018   TRIG 124 11/12/2018   CHOLHDL 4.0 11/12/2018   Lab Results  Component Value Date   WBC 10.5 03/23/2019   HGB 14.0 03/23/2019   HCT 43.4 03/23/2019   MCV 85.3 03/23/2019   PLT 324 03/23/2019   No results found for: IRON, TIBC, FERRITIN  Attestation Statements:   Reviewed by clinician on day of visit: allergies, medications, problem list, medical history, surgical history, family history, social history, and previous encounter notes.  Time spent on visit including pre-visit chart review and post-visit charting and care was 60  minutes.   Fernanda Drum, am acting as Energy manager for [employer], DO   I have reviewed the above documentation for accuracy and completeness, and I agree with the above. Corinna Capra, DO

## 2019-10-12 ENCOUNTER — Encounter (INDEPENDENT_AMBULATORY_CARE_PROVIDER_SITE_OTHER): Payer: Self-pay | Admitting: Bariatrics

## 2019-10-12 DIAGNOSIS — R7303 Prediabetes: Secondary | ICD-10-CM | POA: Insufficient documentation

## 2019-10-12 DIAGNOSIS — E559 Vitamin D deficiency, unspecified: Secondary | ICD-10-CM | POA: Insufficient documentation

## 2019-10-12 LAB — CBC WITH DIFFERENTIAL/PLATELET
Basophils Absolute: 0 10*3/uL (ref 0.0–0.2)
Basos: 0 %
EOS (ABSOLUTE): 0.1 10*3/uL (ref 0.0–0.4)
Eos: 1 %
Hematocrit: 43.9 % (ref 34.0–46.6)
Hemoglobin: 14.3 g/dL (ref 11.1–15.9)
Immature Grans (Abs): 0 10*3/uL (ref 0.0–0.1)
Immature Granulocytes: 0 %
Lymphocytes Absolute: 2.6 10*3/uL (ref 0.7–3.1)
Lymphs: 28 %
MCH: 27.5 pg (ref 26.6–33.0)
MCHC: 32.6 g/dL (ref 31.5–35.7)
MCV: 84 fL (ref 79–97)
Monocytes Absolute: 0.5 10*3/uL (ref 0.1–0.9)
Monocytes: 6 %
Neutrophils Absolute: 5.8 10*3/uL (ref 1.4–7.0)
Neutrophils: 65 %
Platelets: 319 10*3/uL (ref 150–450)
RBC: 5.2 x10E6/uL (ref 3.77–5.28)
RDW: 13.8 % (ref 11.7–15.4)
WBC: 9 10*3/uL (ref 3.4–10.8)

## 2019-10-12 LAB — COMPREHENSIVE METABOLIC PANEL
ALT: 18 IU/L (ref 0–32)
AST: 17 IU/L (ref 0–40)
Albumin/Globulin Ratio: 1.6 (ref 1.2–2.2)
Albumin: 4.5 g/dL (ref 3.9–5.0)
Alkaline Phosphatase: 89 IU/L (ref 48–121)
BUN/Creatinine Ratio: 18 (ref 9–23)
BUN: 12 mg/dL (ref 6–20)
Bilirubin Total: 0.2 mg/dL (ref 0.0–1.2)
CO2: 25 mmol/L (ref 20–29)
Calcium: 9.3 mg/dL (ref 8.7–10.2)
Chloride: 102 mmol/L (ref 96–106)
Creatinine, Ser: 0.66 mg/dL (ref 0.57–1.00)
GFR calc Af Amer: 137 mL/min/{1.73_m2} (ref >59)
GFR calc non Af Amer: 119 mL/min/{1.73_m2} (ref >59)
Globulin, Total: 2.8 g/dL (ref 1.5–4.5)
Glucose: 96 mg/dL (ref 65–99)
Potassium: 4.5 mmol/L (ref 3.5–5.2)
Sodium: 139 mmol/L (ref 134–144)
Total Protein: 7.3 g/dL (ref 6.0–8.5)

## 2019-10-12 LAB — HEMOGLOBIN A1C
Est. average glucose Bld gHb Est-mCnc: 126 mg/dL
Hgb A1c MFr Bld: 6 % — ABNORMAL HIGH (ref 4.8–5.6)

## 2019-10-12 LAB — LIPID PANEL WITH LDL/HDL RATIO
Cholesterol, Total: 172 mg/dL (ref 100–199)
HDL: 36 mg/dL — ABNORMAL LOW (ref >39)
LDL Chol Calc (NIH): 108 mg/dL — ABNORMAL HIGH (ref 0–99)
LDL/HDL Ratio: 3 ratio (ref 0.0–3.2)
Triglycerides: 155 mg/dL — ABNORMAL HIGH (ref 0–149)
VLDL Cholesterol Cal: 28 mg/dL (ref 5–40)

## 2019-10-12 LAB — INSULIN, RANDOM: INSULIN: 34.1 u[IU]/mL — ABNORMAL HIGH (ref 2.6–24.9)

## 2019-10-12 LAB — T3: T3, Total: 128 ng/dL (ref 71–180)

## 2019-10-12 LAB — VITAMIN D 25 HYDROXY (VIT D DEFICIENCY, FRACTURES): Vit D, 25-Hydroxy: 18.6 ng/mL — ABNORMAL LOW (ref 30.0–100.0)

## 2019-10-12 LAB — T4, FREE: Free T4: 1.17 ng/dL (ref 0.82–1.77)

## 2019-10-12 LAB — TSH: TSH: 2.54 u[IU]/mL (ref 0.450–4.500)

## 2019-10-14 ENCOUNTER — Ambulatory Visit: Payer: Medicaid Other | Admitting: Nurse Practitioner

## 2019-10-25 ENCOUNTER — Encounter (INDEPENDENT_AMBULATORY_CARE_PROVIDER_SITE_OTHER): Payer: Self-pay | Admitting: Bariatrics

## 2019-10-25 ENCOUNTER — Other Ambulatory Visit: Payer: Self-pay

## 2019-10-25 ENCOUNTER — Ambulatory Visit (INDEPENDENT_AMBULATORY_CARE_PROVIDER_SITE_OTHER): Payer: Medicaid Other | Admitting: Bariatrics

## 2019-10-25 VITALS — BP 124/86 | HR 73 | Temp 98.7°F | Ht 68.0 in | Wt 320.0 lb

## 2019-10-25 DIAGNOSIS — R7303 Prediabetes: Secondary | ICD-10-CM

## 2019-10-25 DIAGNOSIS — F418 Other specified anxiety disorders: Secondary | ICD-10-CM

## 2019-10-25 DIAGNOSIS — E559 Vitamin D deficiency, unspecified: Secondary | ICD-10-CM | POA: Diagnosis not present

## 2019-10-25 DIAGNOSIS — Z6841 Body Mass Index (BMI) 40.0 and over, adult: Secondary | ICD-10-CM

## 2019-10-25 MED ORDER — METFORMIN HCL 500 MG PO TABS: 500.0000 mg | ORAL_TABLET | Freq: Every day | ORAL | 0 refills | Status: DC

## 2019-10-25 MED ORDER — VITAMIN D (ERGOCALCIFEROL) 1.25 MG (50000 UNIT) PO CAPS: 50000.0000 [IU] | ORAL_CAPSULE | ORAL | 0 refills | Status: DC

## 2019-10-25 MED ORDER — SERTRALINE HCL 50 MG PO TABS: 50.0000 mg | ORAL_TABLET | Freq: Every day | ORAL | 0 refills | Status: DC

## 2019-10-27 NOTE — Progress Notes (Signed)
Chief Complaint:   An interpreter was present throughout the visit.  OBESITY Cindy Dixon is here to discuss her progress with her obesity treatment plan along with follow-up of her obesity related diagnoses. [name] is on the Category 3 Plan and states she is following her eating plan approximately 50% of the time. [name] states she is walking for 30 minutes 5 times per week.  Today's visit was #: 2 Starting weight: 321 lbs Starting date: 10/11/2019 Today's weight: 320 lbs Today's date: 10/25/2019 Total lbs lost to date: 1 lb Total lbs lost since last in-office visit: 1 lb  Interim History: Adia is down 1 pound since her last visit.  She states that the plan was difficult.  Subjective:   1. Vitamin D deficiency Cindy Dixon's Vitamin D level was 18.6 on 10/11/2019. She is currently taking no vitamin D supplement. She denies nausea, vomiting or muscle weakness.  2. Prediabetes Cindy Dixon has a diagnosis of prediabetes based on her elevated HgA1c and was informed this puts her at greater risk of developing diabetes. She continues to work on diet and exercise to decrease her risk of diabetes. She denies nausea or hypoglycemia.  Positive strong family history.  Lab Results  Component Value Date   HGBA1C 6.0 (H) 10/11/2019   Lab Results  Component Value Date   INSULIN 34.1 (H) 10/11/2019   3. Depression with anxiety Present for 3 years.  Denies SI/HI.  She has triggering factors.  Assessment/Plan:   1. Vitamin D deficiency Low Vitamin D level contributes to fatigue and are associated with obesity, breast, and colon cancer. She agrees to start to take prescription Vitamin D @50 ,000 IU every week and will follow-up for routine testing of Vitamin D, at least 2-3 times per year to avoid over-replacement.  2. Prediabetes Cindy Dixon will continue to work on weight loss, exercise, and decreasing simple carbohydrates to help decrease the risk of diabetes.  Will start her  on metformin 500 mg daily.  Prescription sent to pharmacy.  Decrease carbohydrates, increase healthy fats and protein.    3. Depression with anxiety Will start her on Zoloft 50 mg in the morning.  Prescription sent to pharmacy.   4. Class 3 severe obesity with serious comorbidity and body mass index (BMI) of 45.0 to 49.9 in adult, unspecified obesity type (HCC) Cindy Dixon is currently in the action stage of change. As such, her goal is to continue with weight loss efforts. She has agreed to the Category 3 Plan.   She will work on meal planning.  Labs from 10/11/2019 reviewed with the patient and independently, including CMP, lipid panel, CBC, vitamin D, A1c, thyroid panel.  Exercise goals: Continue walking.  Behavioral modification strategies: increasing lean protein intake, decreasing simple carbohydrates, increasing vegetables, increasing water intake, decreasing eating out, no skipping meals, meal planning and cooking strategies, keeping healthy foods in the home and planning for success.  Cindy Dixon has agreed to follow-up with our clinic in 2 weeks. She was informed of the importance of frequent follow-up visits to maximize her success with intensive lifestyle modifications for her multiple health conditions.   Objective:   Blood pressure 124/86, pulse 73, temperature 98.7 F (37.1 C), height 5\' 8"  (1.727 m), weight (!) 320 lb (145.2 kg), SpO2 93 %. Body mass index is 48.66 kg/m.  General: Cooperative, alert, well developed, in no acute distress. HEENT: Conjunctivae and lids unremarkable. Cardiovascular: Regular rhythm.  Lungs: Normal work of breathing. Neurologic: No focal deficits.   Lab Results  Component  Value Date   CREATININE 0.66 10/11/2019   BUN 12 10/11/2019   NA 139 10/11/2019   K 4.5 10/11/2019   CL 102 10/11/2019   CO2 25 10/11/2019   Lab Results  Component Value Date   ALT 18 10/11/2019   AST 17 10/11/2019   ALKPHOS 89 10/11/2019   BILITOT 0.2 10/11/2019    Lab Results  Component Value Date   HGBA1C 6.0 (H) 10/11/2019   HGBA1C 5.7 (H) 11/12/2018   Lab Results  Component Value Date   INSULIN 34.1 (H) 10/11/2019   Lab Results  Component Value Date   TSH 2.540 10/11/2019   Lab Results  Component Value Date   CHOL 172 10/11/2019   HDL 36 (L) 10/11/2019   LDLCALC 108 (H) 10/11/2019   TRIG 155 (H) 10/11/2019   CHOLHDL 4.0 11/12/2018   Lab Results  Component Value Date   WBC 9.0 10/11/2019   HGB 14.3 10/11/2019   HCT 43.9 10/11/2019   MCV 84 10/11/2019   PLT 319 10/11/2019   Attestation Statements:   Reviewed by clinician on day of visit: allergies, medications, problem list, medical history, surgical history, family history, social history, and previous encounter notes.  Time spent on visit including pre-visit chart review and post-visit care and charting was 30 minutes.   I, Insurance claims handler, CMA, am acting as Energy manager for [employer], DO  I have reviewed the above documentation for accuracy and completeness, and I agree with the above. Corinna Capra, DO

## 2019-10-28 ENCOUNTER — Encounter (INDEPENDENT_AMBULATORY_CARE_PROVIDER_SITE_OTHER): Payer: Self-pay | Admitting: Bariatrics

## 2019-11-08 ENCOUNTER — Ambulatory Visit (INDEPENDENT_AMBULATORY_CARE_PROVIDER_SITE_OTHER): Payer: Medicaid Other | Admitting: Bariatrics

## 2019-11-08 ENCOUNTER — Encounter (INDEPENDENT_AMBULATORY_CARE_PROVIDER_SITE_OTHER): Payer: Self-pay | Admitting: Bariatrics

## 2019-11-08 ENCOUNTER — Other Ambulatory Visit: Payer: Self-pay

## 2019-11-08 VITALS — BP 140/86 | HR 63 | Temp 98.0°F | Ht 68.0 in | Wt 316.0 lb

## 2019-11-08 DIAGNOSIS — R7303 Prediabetes: Secondary | ICD-10-CM | POA: Diagnosis not present

## 2019-11-08 DIAGNOSIS — K219 Gastro-esophageal reflux disease without esophagitis: Secondary | ICD-10-CM

## 2019-11-08 DIAGNOSIS — Z6841 Body Mass Index (BMI) 40.0 and over, adult: Secondary | ICD-10-CM

## 2019-11-08 MED ORDER — METFORMIN HCL 500 MG PO TABS: 500.0000 mg | ORAL_TABLET | Freq: Two times a day (BID) | ORAL | 0 refills | Status: DC

## 2019-11-09 NOTE — Progress Notes (Signed)
Chief Complaint:   OBESITY Cindy Dixon is here to discuss her progress with her obesity treatment plan along with follow-up of her obesity related diagnoses. Cindy Dixon is on the Category 3 Plan and states she is following her eating plan approximately 50% of the time. Cindy Dixon states she is walking for 30 minutes 3 times per week.  Today's visit was #: 3 Starting weight: 321 lbs Starting date: 8/302021 Today's weight: 316 lbs Today's date: 11/08/2019 Total lbs lost to date: 5 lbs Total lbs lost since last in-office visit: 4 lbs  Interim History: Cindy Dixon is down 4 pounds.  She followed the plan.  Subjective:   1. Gastroesophageal reflux disease, unspecified whether esophagitis present She is taking Dexilant 60 mg daily for GERD.  2. Prediabetes Cindy Dixon has a diagnosis of prediabetes based on her elevated HgA1c and was informed this puts her at greater risk of developing diabetes. She continues to work on diet and exercise to decrease her risk of diabetes. She denies nausea or hypoglycemia.  She is taking metformin.  Denies polyphagia.  Lab Results  Component Value Date   HGBA1C 6.0 (H) 10/11/2019   Lab Results  Component Value Date   INSULIN 34.1 (H) 10/11/2019   Assessment/Plan:   1. Gastroesophageal reflux disease, unspecified whether esophagitis present Intensive lifestyle modifications are the first line treatment for this issue. We discussed several lifestyle modifications today and she will continue to work on diet, exercise and weight loss efforts. Orders and follow up as documented in patient record.  Continue medication.  Counseling . If a person has gastroesophageal reflux disease (GERD), food and stomach acid move back up into the esophagus and cause symptoms or problems such as damage to the esophagus. . Anti-reflux measures include: raising the head of the bed, avoiding tight clothing or belts, avoiding eating late at night, not lying down shortly  after mealtime, and achieving weight loss. . Avoid ASA, NSAID's, caffeine, alcohol, and tobacco.  . OTC Pepcid and/or Tums are often very helpful for as needed use.  Marland Kitchen However, for persisting chronic or daily symptoms, stronger medications like Omeprazole may be needed. . You may need to avoid foods and drinks such as: ? Coffee and tea (with or without caffeine). ? Drinks that contain alcohol. ? Energy drinks and sports drinks. ? Bubbly (carbonated) drinks or sodas. ? Chocolate and cocoa. ? Peppermint and mint flavorings. ? Garlic and onions. ? Horseradish. ? Spicy and acidic foods. These include peppers, chili powder, curry powder, vinegar, hot sauces, and BBQ sauce. ? Citrus fruit juices and citrus fruits, such as oranges, lemons, and limes. ? Tomato-based foods. These include red sauce, chili, salsa, and pizza with red sauce. ? Fried and fatty foods. These include donuts, french fries, potato chips, and high-fat dressings. ? High-fat meats. These include hot dogs, rib eye steak, sausage, ham, and bacon.  2. Prediabetes Cindy Dixon will continue to work on weight loss, exercise, and decreasing simple carbohydrates to help decrease the risk of diabetes.  Continue metformin.  -Refill metFORMIN (GLUCOPHAGE) 500 MG tablet; Take 1 tablet (500 mg total) by mouth 2 (two) times daily with a meal.  Dispense: 60 tablet; Refill: 0  3. Class 3 severe obesity with serious comorbidity and body mass index (BMI) of 45.0 to 49.9 in adult, unspecified obesity type (HCC)  Cindy Dixon is currently in the action stage of change. As such, her goal is to continue with weight loss efforts. She has agreed to the Category 3 Plan.  She will work on meal planning and intentional eating.  Exercise goals: For substantial health benefits, adults should do at least 150 minutes (2 hours and 30 minutes) a week of moderate-intensity, or 75 minutes (1 hour and 15 minutes) a week of vigorous-intensity aerobic physical  activity, or an equivalent combination of moderate- and vigorous-intensity aerobic activity. Aerobic activity should be performed in episodes of at least 10 minutes, and preferably, it should be spread throughout the week.  Behavioral modification strategies: increasing lean protein intake, decreasing simple carbohydrates, increasing vegetables, increasing water intake, decreasing eating out, no skipping meals, meal planning and cooking strategies, keeping healthy foods in the home and planning for success.  Cindy Dixon has agreed to follow-up with our clinic in 2 weeks. She was informed of the importance of frequent follow-up visits to maximize her success with intensive lifestyle modifications for her multiple health conditions.   Objective:   Blood pressure 140/86, pulse 63, temperature 98 F (36.7 C), height 5\' 8"  (1.727 m), weight (!) 316 lb (143.3 kg), SpO2 96 %. Body mass index is 48.05 kg/m.  General: Cooperative, alert, well developed, in no acute distress. HEENT: Conjunctivae and lids unremarkable. Cardiovascular: Regular rhythm.  Lungs: Normal work of breathing. Neurologic: No focal deficits.   Lab Results  Component Value Date   CREATININE 0.66 10/11/2019   BUN 12 10/11/2019   NA 139 10/11/2019   K 4.5 10/11/2019   CL 102 10/11/2019   CO2 25 10/11/2019   Lab Results  Component Value Date   ALT 18 10/11/2019   AST 17 10/11/2019   ALKPHOS 89 10/11/2019   BILITOT 0.2 10/11/2019   Lab Results  Component Value Date   HGBA1C 6.0 (H) 10/11/2019   HGBA1C 5.7 (H) 11/12/2018   Lab Results  Component Value Date   INSULIN 34.1 (H) 10/11/2019   Lab Results  Component Value Date   TSH 2.540 10/11/2019   Lab Results  Component Value Date   CHOL 172 10/11/2019   HDL 36 (L) 10/11/2019   LDLCALC 108 (H) 10/11/2019   TRIG 155 (H) 10/11/2019   CHOLHDL 4.0 11/12/2018   Lab Results  Component Value Date   WBC 9.0 10/11/2019   HGB 14.3 10/11/2019   HCT 43.9 10/11/2019     MCV 84 10/11/2019   PLT 319 10/11/2019   Attestation Statements:   Reviewed by clinician on day of visit: allergies, medications, problem list, medical history, surgical history, family history, social history, and previous encounter notes.  I, 10/13/2019, CMA, am acting as Insurance claims handler for Energy manager, DO  I have reviewed the above documentation for accuracy and completeness, and I agree with the above. [employer], DO

## 2019-11-10 ENCOUNTER — Encounter (INDEPENDENT_AMBULATORY_CARE_PROVIDER_SITE_OTHER): Payer: Self-pay | Admitting: Bariatrics

## 2019-11-13 ENCOUNTER — Other Ambulatory Visit (INDEPENDENT_AMBULATORY_CARE_PROVIDER_SITE_OTHER): Payer: Self-pay | Admitting: Bariatrics

## 2019-11-13 DIAGNOSIS — E559 Vitamin D deficiency, unspecified: Secondary | ICD-10-CM

## 2019-11-15 ENCOUNTER — Other Ambulatory Visit (INDEPENDENT_AMBULATORY_CARE_PROVIDER_SITE_OTHER): Payer: Self-pay | Admitting: Bariatrics

## 2019-11-15 DIAGNOSIS — F418 Other specified anxiety disorders: Secondary | ICD-10-CM

## 2019-11-16 ENCOUNTER — Ambulatory Visit: Payer: Medicaid Other | Attending: Internal Medicine | Admitting: Internal Medicine

## 2019-11-16 ENCOUNTER — Other Ambulatory Visit: Payer: Self-pay

## 2019-11-22 ENCOUNTER — Encounter (INDEPENDENT_AMBULATORY_CARE_PROVIDER_SITE_OTHER): Payer: Self-pay | Admitting: Bariatrics

## 2019-11-22 ENCOUNTER — Other Ambulatory Visit: Payer: Self-pay

## 2019-11-22 ENCOUNTER — Ambulatory Visit (INDEPENDENT_AMBULATORY_CARE_PROVIDER_SITE_OTHER): Payer: Medicaid Other | Admitting: Bariatrics

## 2019-11-22 VITALS — BP 125/90 | HR 70 | Temp 98.3°F | Ht 68.0 in | Wt 317.0 lb

## 2019-11-22 DIAGNOSIS — F5089 Other specified eating disorder: Secondary | ICD-10-CM | POA: Diagnosis not present

## 2019-11-22 DIAGNOSIS — E559 Vitamin D deficiency, unspecified: Secondary | ICD-10-CM

## 2019-11-22 DIAGNOSIS — R7303 Prediabetes: Secondary | ICD-10-CM

## 2019-11-22 DIAGNOSIS — Z6841 Body Mass Index (BMI) 40.0 and over, adult: Secondary | ICD-10-CM

## 2019-11-22 MED ORDER — METFORMIN HCL 500 MG PO TABS: 500.0000 mg | ORAL_TABLET | Freq: Two times a day (BID) | ORAL | 0 refills | Status: DC

## 2019-11-22 MED ORDER — TOPIRAMATE 50 MG PO TABS: ORAL_TABLET | ORAL | 0 refills | Status: DC

## 2019-11-22 MED ORDER — VITAMIN D (ERGOCALCIFEROL) 1.25 MG (50000 UNIT) PO CAPS: 50000.0000 [IU] | ORAL_CAPSULE | ORAL | 0 refills | Status: DC

## 2019-11-23 ENCOUNTER — Encounter (INDEPENDENT_AMBULATORY_CARE_PROVIDER_SITE_OTHER): Payer: Self-pay | Admitting: Bariatrics

## 2019-11-23 NOTE — Progress Notes (Signed)
Chief Complaint:   OBESITY Cindy Dixon is here to discuss her progress with her obesity treatment plan along with follow-up of her obesity related diagnoses. Cindy Dixon is on the Category 3 Plan and states she is following her eating plan approximately 50% of the time. Cindy Dixon states she is walking 90 minutes 3 times per week.  Today's visit was #: 4 Starting weight: 321 lbs Starting date: 10/11/2019 Today's weight: 317 lbs Today's date: 11/22/2019 Total lbs lost to date: 4 Total lbs lost since last in-office visit: 0  Interim History: Cindy Dixon is up 1 lbs. She is trying to stick to the plan, but is difficult due to cravings.  Subjective:   Vitamin D deficiency. Cindy Dixon reports minimal sunlight exposure.   Ref. Range 10/11/2019 10:40  Vitamin D, 25-Hydroxy Latest Ref Range: 30.0 - 100.0 ng/mL 18.6 (L)   Prediabetes. Cindy Dixon has a diagnosis of prediabetes based on her elevated HgA1c and was informed this puts her at greater risk of developing diabetes. She continues to work on diet and exercise to decrease her risk of diabetes. She denies nausea or hypoglycemia. No polyphagia.  Lab Results  Component Value Date   HGBA1C 6.0 (H) 10/11/2019   Lab Results  Component Value Date   INSULIN 34.1 (H) 10/11/2019   Other disorder of eating. Cindy Dixon reports disordered eating all day.  Assessment/Plan:   Vitamin D deficiency. Low Vitamin D level contributes to fatigue and are associated with obesity, breast, and colon cancer. She was given a prescription for Vitamin D, Ergocalciferol, (DRISDOL) 1.25 MG (50000 UNIT) CAPS capsule every week #4 with 0 refills and will follow-up for routine testing of Vitamin D, at least 2-3 times per year to avoid over-replacement.   Prediabetes. Cindy Dixon will continue to work on weight loss, exercise, and decreasing simple carbohydrates to help decrease the risk of diabetes. Prescription was given for metFORMIN (GLUCOPHAGE) 500  MG tablet 1 BID with meals #60 with 0 refills.  Other disorder of eating. Prescription was given for topiramate (TOPAMAX) 50 MG tablet 1 PO in the evening.  Class 3 severe obesity with serious comorbidity and body mass index (BMI) of 45.0 to 49.9 in adult, unspecified obesity type (HCC).  Cindy Dixon is currently in the action stage of change. As such, her goal is to continue with weight loss efforts. She has agreed to the Category 3 Plan.   She will work on meal planning and intentional eating.   Exercise goals: All adults should avoid inactivity. Some physical activity is better than none, and adults who participate in any amount of physical activity gain some health benefits.  Behavioral modification strategies: increasing lean protein intake, decreasing simple carbohydrates, increasing vegetables, increasing water intake, decreasing eating out, no skipping meals, meal planning and cooking strategies, keeping healthy foods in the home and planning for success.  Cindy Dixon has agreed to follow-up with our clinic in 3 weeks. She was informed of the importance of frequent follow-up visits to maximize her success with intensive lifestyle modifications for her multiple health conditions.   Objective:   Blood pressure 125/90, pulse 70, temperature 98.3 F (36.8 C), height 5\' 8"  (1.727 m), weight (!) 317 lb (143.8 kg), last menstrual period 10/23/2019, SpO2 94 %. Body mass index is 48.2 kg/m.  General: Cooperative, alert, well developed, in no acute distress. HEENT: Conjunctivae and lids unremarkable. Cardiovascular: Regular rhythm.  Lungs: Normal work of breathing. Neurologic: No focal deficits.   Lab Results  Component Value Date   CREATININE  0.66 10/11/2019   BUN 12 10/11/2019   NA 139 10/11/2019   K 4.5 10/11/2019   CL 102 10/11/2019   CO2 25 10/11/2019   Lab Results  Component Value Date   ALT 18 10/11/2019   AST 17 10/11/2019   ALKPHOS 89 10/11/2019   BILITOT 0.2  10/11/2019   Lab Results  Component Value Date   HGBA1C 6.0 (H) 10/11/2019   HGBA1C 5.7 (H) 11/12/2018   Lab Results  Component Value Date   INSULIN 34.1 (H) 10/11/2019   Lab Results  Component Value Date   TSH 2.540 10/11/2019   Lab Results  Component Value Date   CHOL 172 10/11/2019   HDL 36 (L) 10/11/2019   LDLCALC 108 (H) 10/11/2019   TRIG 155 (H) 10/11/2019   CHOLHDL 4.0 11/12/2018   Lab Results  Component Value Date   WBC 9.0 10/11/2019   HGB 14.3 10/11/2019   HCT 43.9 10/11/2019   MCV 84 10/11/2019   PLT 319 10/11/2019   No results found for: IRON, TIBC, FERRITIN  Attestation Statements:   Reviewed by clinician on day of visit: allergies, medications, problem list, medical history, surgical history, family history, social history, and previous encounter notes.  Fernanda Drum, am acting as Energy manager for [employer], DO   I have reviewed the above documentation for accuracy and completeness, and I agree with the above. Corinna Capra, DO

## 2019-12-11 ENCOUNTER — Other Ambulatory Visit (INDEPENDENT_AMBULATORY_CARE_PROVIDER_SITE_OTHER): Payer: Self-pay | Admitting: Bariatrics

## 2019-12-11 DIAGNOSIS — E559 Vitamin D deficiency, unspecified: Secondary | ICD-10-CM

## 2019-12-13 ENCOUNTER — Ambulatory Visit (INDEPENDENT_AMBULATORY_CARE_PROVIDER_SITE_OTHER): Payer: Medicaid Other | Admitting: Bariatrics

## 2019-12-13 ENCOUNTER — Encounter (INDEPENDENT_AMBULATORY_CARE_PROVIDER_SITE_OTHER): Payer: Self-pay | Admitting: Bariatrics

## 2019-12-13 ENCOUNTER — Other Ambulatory Visit: Payer: Self-pay

## 2019-12-13 VITALS — BP 111/78 | HR 70 | Temp 97.7°F | Ht 68.0 in | Wt 312.0 lb

## 2019-12-13 DIAGNOSIS — R7303 Prediabetes: Secondary | ICD-10-CM | POA: Diagnosis not present

## 2019-12-13 DIAGNOSIS — G43809 Other migraine, not intractable, without status migrainosus: Secondary | ICD-10-CM

## 2019-12-13 DIAGNOSIS — Z6841 Body Mass Index (BMI) 40.0 and over, adult: Secondary | ICD-10-CM

## 2019-12-13 DIAGNOSIS — F3289 Other specified depressive episodes: Secondary | ICD-10-CM | POA: Diagnosis not present

## 2019-12-13 DIAGNOSIS — E559 Vitamin D deficiency, unspecified: Secondary | ICD-10-CM | POA: Diagnosis not present

## 2019-12-13 MED ORDER — SUMATRIPTAN SUCCINATE 50 MG PO TABS: 50.0000 mg | ORAL_TABLET | ORAL | 0 refills | Status: DC | PRN

## 2019-12-13 MED ORDER — TOPIRAMATE 50 MG PO TABS: ORAL_TABLET | ORAL | 0 refills | Status: DC

## 2019-12-13 MED ORDER — VITAMIN D (ERGOCALCIFEROL) 1.25 MG (50000 UNIT) PO CAPS: 50000.0000 [IU] | ORAL_CAPSULE | ORAL | 0 refills | Status: DC

## 2019-12-13 MED ORDER — METFORMIN HCL 500 MG PO TABS: 500.0000 mg | ORAL_TABLET | Freq: Two times a day (BID) | ORAL | 0 refills | Status: DC

## 2019-12-13 NOTE — Telephone Encounter (Signed)
Last OV with Dr Brown 

## 2019-12-14 ENCOUNTER — Encounter (INDEPENDENT_AMBULATORY_CARE_PROVIDER_SITE_OTHER): Payer: Self-pay | Admitting: Bariatrics

## 2019-12-14 NOTE — Progress Notes (Signed)
Chief Complaint:   INTERPRETER: Christene Lye  Cindy Dixon is here to discuss her progress with her Cindy treatment plan along with follow-up of her Cindy related diagnoses. Cindy Dixon is on the Category 3 Plan and states she is following her eating plan approximately 70% of the time. Cindy Dixon states she is walking 30 minutes 2 times per week.  Today's visit was #: 5 Starting weight: 321 lbs Starting date: 10/11/2019 Today's weight: 312 lbs Today's date: 12/13/2019 Total lbs lost to date: 9 Total lbs lost since last in-office visit: 5  Interim History: Cindy Dixon is down an additional 5 lbs since her last visit.  Subjective:   Prediabetes. Cindy Dixon has a diagnosis of prediabetes based on her elevated HgA1c and was informed this puts her at greater risk of developing diabetes. She continues to work on diet and exercise to decrease her risk of diabetes. She denies nausea or hypoglycemia. No polyphagia.  Lab Results  Component Value Date   HGBA1C 6.0 (H) 10/11/2019   Lab Results  Component Value Date   INSULIN 34.1 (H) 10/11/2019   Vitamin D deficiency. No nausea, vomiting, or muscle weakness.    Ref. Range 10/11/2019 10:40  Vitamin D, 25-Hydroxy Latest Ref Range: 30.0 - 100.0 ng/mL 18.6 (L)   Other depression, with emotional eating. Cindy Dixon is struggling with emotional eating and using food for comfort to the extent that it is negatively impacting her health. She has been working on behavior modification techniques to help reduce her emotional eating and has been somewhat successful. She shows no sign of suicidal or homicidal ideations. Cindy Dixon reports Topamax is helping with stress eating.  Other migraine without status migrainosus, not intractable. Cindy Dixon reports migraines 3 times a week with nausea. She states that she had a recent work-up.  She reports blood pressure is elevated with migraines but blood pressure is normal otherwise. . No  medications were prescribed.   Assessment/Plan:   Prediabetes. Cindy Dixon will continue to work on weight loss, exercise, and decreasing simple carbohydrates to help decrease the risk of diabetes. Prescription was given for metFORMIN (GLUCOPHAGE) 500 MG tablet 1 BID daily with meals #60 with 0 refills.  Vitamin D deficiency. Low Vitamin D level contributes to fatigue and are associated with Cindy, breast, and colon cancer. She was given a prescription for Vitamin D, Ergocalciferol, (DRISDOL) 1.25 MG (50000 UNIT) CAPS capsule every week #4 with 0 refills and will follow-up for routine testing of Vitamin D, at least 2-3 times per year to avoid over-replacement.   Other depression, with emotional eating. Behavior modification techniques were discussed today to help Cindy Dixon deal with her emotional/non-hunger eating behaviors.  Orders and follow up as documented in patient record. Prescription was given for topiramate (TOPAMAX) 50 MG tablet 1 with evening meal #30 with 0 refills.  Other migraine without status migrainosus, not intractable. Prescription ws given for SUMAtriptan (IMITREX) 50 MG tablet 50 mg as needed for migraine - can repeat in 2 hours if needed #10 with 0 refills. Referral to PCP (new PCP). If no resolution will consider referral to neurologist for a work-up for intracranial hypertension  Class 3 severe Cindy with serious comorbidity and body mass index (BMI) of 45.0 to 49.9 in adult, unspecified Cindy type (HCC).  Cindy Dixon is currently in the action stage of change. As such, her goal is to continue with weight loss efforts. She has agreed to the Category 3 Plan.   She will work on meal planning, intentional eating, and  will weigh meat and if not enough protein.  Referral for PCP as she wants to change PCP's.  Exercise goals: All adults should avoid inactivity. Some physical activity is better than none, and adults who participate in any amount of physical activity gain  some health benefits.  Behavioral modification strategies: increasing lean protein intake, decreasing simple carbohydrates, increasing vegetables, increasing water intake, decreasing eating out, no skipping meals, meal planning and cooking strategies, keeping healthy foods in the home and planning for success.  Cindy Dixon has agreed to follow-up with our clinic in 2-3 weeks. She was informed of the importance of frequent follow-up visits to maximize her success with intensive lifestyle modifications for her multiple health conditions.   Objective:   Blood pressure 111/78, pulse 70, temperature 97.7 F (36.5 C), height 5\' 8"  (1.727 m), weight (!) 312 lb (141.5 kg), SpO2 95 %. Body mass index is 47.44 kg/m.  General: Cooperative, alert, well developed, in no acute distress. HEENT: Conjunctivae and lids unremarkable. Cardiovascular: Regular rhythm.  Lungs: Normal work of breathing. Neurologic: No focal deficits.   Lab Results  Component Value Date   CREATININE 0.66 10/11/2019   BUN 12 10/11/2019   NA 139 10/11/2019   K 4.5 10/11/2019   CL 102 10/11/2019   CO2 25 10/11/2019   Lab Results  Component Value Date   ALT 18 10/11/2019   AST 17 10/11/2019   ALKPHOS 89 10/11/2019   BILITOT 0.2 10/11/2019   Lab Results  Component Value Date   HGBA1C 6.0 (H) 10/11/2019   HGBA1C 5.7 (H) 11/12/2018   Lab Results  Component Value Date   INSULIN 34.1 (H) 10/11/2019   Lab Results  Component Value Date   TSH 2.540 10/11/2019   Lab Results  Component Value Date   CHOL 172 10/11/2019   HDL 36 (L) 10/11/2019   LDLCALC 108 (H) 10/11/2019   TRIG 155 (H) 10/11/2019   CHOLHDL 4.0 11/12/2018   Lab Results  Component Value Date   WBC 9.0 10/11/2019   HGB 14.3 10/11/2019   HCT 43.9 10/11/2019   MCV 84 10/11/2019   PLT 319 10/11/2019   No results found for: IRON, TIBC, FERRITIN  Attestation Statements:   Reviewed by clinician on day of visit: allergies, medications, problem list,  medical history, surgical history, family history, social history, and previous encounter notes.  10/13/2019, am acting as Fernanda Drum for Energy manager, DO   I have reviewed the above documentation for accuracy and completeness, and I agree with the above. [employer], DO

## 2019-12-27 ENCOUNTER — Other Ambulatory Visit: Payer: Self-pay

## 2019-12-27 ENCOUNTER — Encounter (INDEPENDENT_AMBULATORY_CARE_PROVIDER_SITE_OTHER): Payer: Self-pay | Admitting: Bariatrics

## 2019-12-27 ENCOUNTER — Ambulatory Visit (INDEPENDENT_AMBULATORY_CARE_PROVIDER_SITE_OTHER): Payer: Medicaid Other | Admitting: Bariatrics

## 2019-12-27 VITALS — BP 142/80 | HR 66 | Temp 98.1°F | Ht 68.0 in | Wt 312.0 lb

## 2019-12-27 DIAGNOSIS — R7303 Prediabetes: Secondary | ICD-10-CM

## 2019-12-27 DIAGNOSIS — Z6841 Body Mass Index (BMI) 40.0 and over, adult: Secondary | ICD-10-CM | POA: Diagnosis not present

## 2019-12-27 DIAGNOSIS — E559 Vitamin D deficiency, unspecified: Secondary | ICD-10-CM | POA: Diagnosis not present

## 2019-12-28 ENCOUNTER — Encounter (INDEPENDENT_AMBULATORY_CARE_PROVIDER_SITE_OTHER): Payer: Self-pay | Admitting: Bariatrics

## 2019-12-28 NOTE — Progress Notes (Signed)
INTERPRETER: [phone] via phone [facility]) - [phone]  Chief Complaint:   OBESITY Cindy Dixon is here to discuss her progress with her obesity treatment plan along with follow-up of her obesity related diagnoses. Cindy Dixon is on the Category 3 Plan and states she is following her eating plan approximately 90% of the time. Cindy Dixon states she is walking 30 minutes 7 times per week.  Today's visit was #: 6 Starting weight: 321 lbs Starting date: 10/11/2019 Today's weight: 312 lbs Today's date: 12/27/2019 Total lbs lost to date: 9 Total lbs lost since last in-office visit: 0  Interim History: Cindy Dixon's weight remains the same. She is still eating more during the day, but not skipping meals.  Subjective:   Vitamin D deficiency. Cindy Dixon is taking Vitamin D supplementation.    Ref. Range 10/11/2019 10:40  Vitamin D, 25-Hydroxy Latest Ref Range: 30.0 - 100.0 ng/mL 18.6 (L)   Prediabetes. Cindy Dixon has a diagnosis of prediabetes based on her elevated HgA1c and was informed this puts her at greater risk of developing diabetes. She continues to work on diet and exercise to decrease her risk of diabetes. She denies nausea or hypoglycemia. Cindy Dixon is taking metformin.  Lab Results  Component Value Date   HGBA1C 6.0 (H) 10/11/2019   Lab Results  Component Value Date   INSULIN 34.1 (H) 10/11/2019   Assessment/Plan:   Vitamin D deficiency. Low Vitamin D level contributes to fatigue and are associated with obesity, breast, and colon cancer. She agrees to continue to take Vitamin D as directed and will follow-up for routine testing of Vitamin D, at least 2-3 times per year to avoid over-replacement.  Prediabetes. Cindy Dixon will continue to work on weight loss, exercise, and decreasing simple carbohydrates to help decrease the risk of diabetes. She will continue metformin 1000 mg with breakfast and dinner.  Class 3 severe obesity with serious  comorbidity and body mass index (BMI) of 45.0 to 49.9 in adult, unspecified obesity type (HCC).  Cindy Dixon is currently in the action stage of change. As such, her goal is to continue with weight loss efforts. She has agreed to the Category 3 Plan.   She will work on meal planning, intentional eating, and double the meat for salad.  Exercise goals: All adults should avoid inactivity. Some physical activity is better than none, and adults who participate in any amount of physical activity gain some health benefits.  Behavioral modification strategies: increasing lean protein intake, decreasing simple carbohydrates, increasing vegetables, increasing water intake, decreasing eating out, no skipping meals, meal planning and cooking strategies, keeping healthy foods in the home and planning for success.  Cindy Dixon has agreed to follow-up with our clinic in 2 weeks. She was informed of the importance of frequent follow-up visits to maximize her success with intensive lifestyle modifications for her multiple health conditions.   Objective:   Blood pressure (!) 142/80, pulse 66, temperature 98.1 F (36.7 C), height 5\' 8"  (1.727 m), weight (!) 312 lb (141.5 kg). Body mass index is 47.44 kg/m.  General: Cooperative, alert, well developed, in no acute distress. HEENT: Conjunctivae and lids unremarkable. Cardiovascular: Regular rhythm.  Lungs: Normal work of breathing. Neurologic: No focal deficits.   Lab Results  Component Value Date   CREATININE 0.66 10/11/2019   BUN 12 10/11/2019   NA 139 10/11/2019   K 4.5 10/11/2019   CL 102 10/11/2019   CO2 25 10/11/2019   Lab Results  Component Value Date   ALT 18 10/11/2019  AST 17 10/11/2019   ALKPHOS 89 10/11/2019   BILITOT 0.2 10/11/2019   Lab Results  Component Value Date   HGBA1C 6.0 (H) 10/11/2019   HGBA1C 5.7 (H) 11/12/2018   Lab Results  Component Value Date   INSULIN 34.1 (H) 10/11/2019   Lab Results  Component Value Date    TSH 2.540 10/11/2019   Lab Results  Component Value Date   CHOL 172 10/11/2019   HDL 36 (L) 10/11/2019   LDLCALC 108 (H) 10/11/2019   TRIG 155 (H) 10/11/2019   CHOLHDL 4.0 11/12/2018   Lab Results  Component Value Date   WBC 9.0 10/11/2019   HGB 14.3 10/11/2019   HCT 43.9 10/11/2019   MCV 84 10/11/2019   PLT 319 10/11/2019   No results found for: IRON, TIBC, FERRITIN  Attestation Statements:   Reviewed by clinician on day of visit: allergies, medications, problem list, medical history, surgical history, family history, social history, and previous encounter notes.   Fernanda Drum, am acting as Energy manager for [employer], DO   I have reviewed the above documentation for accuracy and completeness, and I agree with the above. Corinna Capra, DO

## 2019-12-30 ENCOUNTER — Telehealth (INDEPENDENT_AMBULATORY_CARE_PROVIDER_SITE_OTHER): Payer: Self-pay

## 2020-01-10 ENCOUNTER — Encounter (INDEPENDENT_AMBULATORY_CARE_PROVIDER_SITE_OTHER): Payer: Self-pay | Admitting: Bariatrics

## 2020-01-10 ENCOUNTER — Ambulatory Visit (INDEPENDENT_AMBULATORY_CARE_PROVIDER_SITE_OTHER): Payer: Medicaid Other | Admitting: Bariatrics

## 2020-01-10 ENCOUNTER — Other Ambulatory Visit: Payer: Self-pay

## 2020-01-10 VITALS — BP 125/86 | HR 71 | Temp 98.2°F | Ht 68.0 in | Wt 313.0 lb

## 2020-01-10 DIAGNOSIS — F3289 Other specified depressive episodes: Secondary | ICD-10-CM

## 2020-01-10 DIAGNOSIS — R7303 Prediabetes: Secondary | ICD-10-CM | POA: Diagnosis not present

## 2020-01-10 DIAGNOSIS — G43809 Other migraine, not intractable, without status migrainosus: Secondary | ICD-10-CM | POA: Diagnosis not present

## 2020-01-10 DIAGNOSIS — Z6841 Body Mass Index (BMI) 40.0 and over, adult: Secondary | ICD-10-CM | POA: Diagnosis not present

## 2020-01-10 DIAGNOSIS — K219 Gastro-esophageal reflux disease without esophagitis: Secondary | ICD-10-CM | POA: Diagnosis not present

## 2020-01-10 DIAGNOSIS — E559 Vitamin D deficiency, unspecified: Secondary | ICD-10-CM

## 2020-01-10 MED ORDER — SUMATRIPTAN SUCCINATE 50 MG PO TABS: 50.0000 mg | ORAL_TABLET | ORAL | 0 refills | Status: DC | PRN

## 2020-01-10 MED ORDER — SERTRALINE HCL 50 MG PO TABS: 50.0000 mg | ORAL_TABLET | Freq: Every day | ORAL | 0 refills | Status: DC

## 2020-01-10 MED ORDER — METFORMIN HCL 1000 MG PO TABS: 1000.0000 mg | ORAL_TABLET | Freq: Two times a day (BID) | ORAL | 0 refills | Status: DC

## 2020-01-10 MED ORDER — TOPIRAMATE 50 MG PO TABS: ORAL_TABLET | ORAL | 0 refills | Status: DC

## 2020-01-10 MED ORDER — VITAMIN D (ERGOCALCIFEROL) 1.25 MG (50000 UNIT) PO CAPS: 50000.0000 [IU] | ORAL_CAPSULE | ORAL | 0 refills | Status: DC

## 2020-01-10 MED ORDER — DEXLANSOPRAZOLE 60 MG PO CPDR: 60.0000 mg | DELAYED_RELEASE_CAPSULE | Freq: Every day | ORAL | 5 refills | Status: DC

## 2020-01-10 NOTE — Progress Notes (Signed)
Chief Complaint:   INTERPRETER:  Barbette Merino  Cindy Dixon is here to discuss her progress with her Cindy treatment plan along with follow-up of her Cindy related diagnoses. Cindy Dixon is on the Category 3 Plan and states she is following her eating plan approximately 50% of the time. Cindy Dixon states she is walking 30 minutes 5 times per week.  Today's visit was #: 7 Starting weight: 321 lbs Starting date: 10/11/2019 Today's weight: 313 lbs Today's date: 01/10/2020 Total lbs lost to date: 8 Total lbs lost since last in-office visit: 0  Interim History: Cindy Dixon is up 1 lb since her last visit.  Subjective:   Other migraine without status migrainosus, not intractable. Cindy Dixon denies blurred vision. She reports taking her medications as directed and migraines are improved.  Other depression. Cindy Dixon is struggling with emotional eating and using food for comfort to the extent that it is negatively impacting her health. She has been working on behavior modification techniques to help reduce her emotional eating and has been somewhat successful. She shows no sign of suicidal or homicidal ideations. Cindy Dixon reports taking her medication as directed.   Gastroesophageal reflux disease, unspecified whether esophagitis present. Cindy Dixon reports taking her medications as directed.   Vitamin D deficiency. No nausea, vomiting, or muscle weakness.    Ref. Range 10/11/2019 10:40  Vitamin D, 25-Hydroxy Latest Ref Range: 30.0 - 100.0 ng/mL 18.6 (L)   Prediabetes. Cindy Dixon has a diagnosis of prediabetes based on her elevated HgA1c and was informed this puts her at greater risk of developing diabetes. She continues to work on diet and exercise to decrease her risk of diabetes. She denies nausea or hypoglycemia. Cindy Dixon endorses polyphagia.  Lab Results  Component Value Date   HGBA1C 6.0 (H) 10/11/2019   Lab Results  Component Value Date   INSULIN 34.1  (H) 10/11/2019   Assessment/Plan:   Other migraine without status migrainosus, not intractable. Prescription was given for SUMAtriptan (IMITREX) 50 MG tablet 1 every 2 hours x2 - do not exceed 2 tablets in 24 hours - #10 with 0 refills.  Other depression. Behavior modification techniques were discussed today to help Cindy Dixon deal with her emotional/non-hunger eating behaviors.  Orders and follow up as documented in patient record. Prescriptions were given for topiramate (TOPAMAX) 50 MG tablet 1 PO with every meal #30 with 0 refills and sertraline (ZOLOFT) 50 MG tablet 1 PO daily #30 with 0 refills.  Gastroesophageal reflux disease, unspecified whether esophagitis present. Intensive lifestyle modifications are the first line treatment for this issue. We discussed several lifestyle modifications today and she will continue to work on diet, exercise and weight loss efforts. Orders and follow up as documented in patient record. Prescription was given for dexlansoprazole (DEXILANT) 60 MG capsule 1 PO daily #30 with 0 refills.  Counseling . If a person has gastroesophageal reflux disease (GERD), food and stomach acid move back up into the esophagus and cause symptoms or problems such as damage to the esophagus. . Anti-reflux measures include: raising the head of the bed, avoiding tight clothing or belts, avoiding eating late at night, not lying down shortly after mealtime, and achieving weight loss. . Avoid ASA, NSAID's, caffeine, alcohol, and tobacco.  . OTC Pepcid and/or Tums are often very helpful for as needed use.  Marland Kitchen However, for persisting chronic or daily symptoms, stronger medications like Omeprazole may be needed. . You may need to avoid foods and drinks such as: ? Coffee and tea (with or without  caffeine). ? Drinks that contain alcohol. ? Energy drinks and sports drinks. ? Bubbly (carbonated) drinks or sodas. ? Chocolate and cocoa. ? Peppermint and mint flavorings. ? Garlic and  onions. ? Horseradish. ? Spicy and acidic foods. These include peppers, chili powder, curry powder, vinegar, hot sauces, and BBQ sauce. ? Citrus fruit juices and citrus fruits, such as oranges, lemons, and limes. ? Tomato-based foods. These include red sauce, chili, salsa, and pizza with red sauce. ? Fried and fatty foods. These include donuts, french fries, potato chips, and high-fat dressings. ? High-fat meats. These include hot dogs, rib eye steak, sausage, ham, and bacon.   Vitamin D deficiency. Low Vitamin D level contributes to fatigue and are associated with Cindy, breast, and colon cancer. She was given a prescription for Vitamin D, Ergocalciferol, (DRISDOL) 1.25 MG (50000 UNIT) CAPS capsule every week #4 with 0 refills and will follow-up for routine testing of Vitamin D, at least 2-3 times per year to avoid over-replacement.    Prediabetes. Cindy Dixon will continue to work on weight loss, exercise, and decreasing simple carbohydrates to help decrease the risk of diabetes. Prescription was given for metFORMIN (GLUCOPHAGE) 1000 MG tablet 1 BID #60 with 0 refills.  Class 3 severe Cindy with serious comorbidity and body mass index (BMI) of 45.0 to 49.9 in adult, unspecified Cindy type (HCC).  Cindy Dixon is currently in the action stage of change. As such, her goal is to continue with weight loss efforts. She has agreed to the Category 3 Plan.   She will work on meal planning, decreasing carbohydrate intake, and continuing activities.  Cindy Dixon will be referred to a different PCP office.  Exercise goals: All adults should avoid inactivity. Some physical activity is better than none, and adults who participate in any amount of physical activity gain some health benefits.  Behavioral modification strategies: increasing lean protein intake, decreasing simple carbohydrates, increasing vegetables, increasing water intake, decreasing eating out, no skipping meals, meal planning and  cooking strategies, keeping healthy foods in the home, travel eating strategies, holiday eating strategies , celebration eating strategies, avoiding temptations and planning for success.  Cindy Dixon has agreed to follow-up with our clinic in 2 weeks. She was informed of the importance of frequent follow-up visits to maximize her success with intensive lifestyle modifications for her multiple health conditions.   Objective:   Blood pressure 125/86, pulse 71, temperature 98.2 F (36.8 C), height 5\' 8"  (1.727 m), weight (!) 313 lb (142 kg), SpO2 97 %. Body mass index is 47.59 kg/m.  General: Cooperative, alert, well developed, in no acute distress. HEENT: Conjunctivae and lids unremarkable. Cardiovascular: Regular rhythm.  Lungs: Normal work of breathing. Neurologic: No focal deficits.   Lab Results  Component Value Date   CREATININE 0.66 10/11/2019   BUN 12 10/11/2019   NA 139 10/11/2019   K 4.5 10/11/2019   CL 102 10/11/2019   CO2 25 10/11/2019   Lab Results  Component Value Date   ALT 18 10/11/2019   AST 17 10/11/2019   ALKPHOS 89 10/11/2019   BILITOT 0.2 10/11/2019   Lab Results  Component Value Date   HGBA1C 6.0 (H) 10/11/2019   HGBA1C 5.7 (H) 11/12/2018   Lab Results  Component Value Date   INSULIN 34.1 (H) 10/11/2019   Lab Results  Component Value Date   TSH 2.540 10/11/2019   Lab Results  Component Value Date   CHOL 172 10/11/2019   HDL 36 (L) 10/11/2019   LDLCALC 108 (H) 10/11/2019  TRIG 155 (H) 10/11/2019   CHOLHDL 4.0 11/12/2018   Lab Results  Component Value Date   WBC 9.0 10/11/2019   HGB 14.3 10/11/2019   HCT 43.9 10/11/2019   MCV 84 10/11/2019   PLT 319 10/11/2019   No results found for: IRON, TIBC, FERRITIN  Attestation Statements:   Reviewed by clinician on day of visit: allergies, medications, problem list, medical history, surgical history, family history, social history, and previous encounter notes.  Fernanda Drum, am acting as  Energy manager for [employer], DO   I have reviewed the above documentation for accuracy and completeness, and I agree with the above. Corinna Capra, DO

## 2020-01-13 ENCOUNTER — Ambulatory Visit: Payer: Medicaid Other | Admitting: Nurse Practitioner

## 2020-01-13 NOTE — Progress Notes (Deleted)
     01/13/2020 Cindy Dixon [id number] [birth year]   Chief Complaint:  History of Present Illness: Cindy Dixon is a 29 year old female with a past medical history of morbid obesity, depression, migraine headaches and GERD.  Past cholecystectomy.   She previously underwent an EGD 04/22/2019 by Dr. Meridee Score which identified a 1 cm hiatal hernia and chronic inactive gastritis.  No evidence of Helicobacter pylori.  Esophageal biopsies showed minimally inflamed squamous mucosa.  Would allow trial of additional Gaviscon and hope of improvement in BMI before pH impedence testing to rule out a concomittant Hypersensitivity esophagus.  At planned follow up, if still having issues then can proceed with ordering.  Would get a manometry with the testing to ensure no evidence of dysmotility, if a GERD-related surgery were to ever be considered in the future  8lb weight loss followed by [facility].    Current Medications, Allergies, Past Medical History, Past Surgical History, Family History and Social History were reviewed in [employer] record.   Review of Systems:   Constitutional: Negative for fever, sweats, chills or weight loss.  Respiratory: Negative for shortness of breath.   Cardiovascular: Negative for chest pain, palpitations and leg swelling.  Gastrointestinal: See HPI.  Musculoskeletal: Negative for back pain or muscle aches.  Neurological: Negative for dizziness, headaches or paresthesias.    Physical Exam: There were no vitals taken for this visit. General: Well developed, w   ***female in no acute distress. Head: Normocephalic and atraumatic. Eyes: No scleral icterus. Conjunctiva pink . Ears: Normal auditory acuity. Mouth: Dentition intact. No ulcers or lesions.  Lungs: Clear throughout to auscultation. Heart: Regular rate and rhythm, no murmur. Abdomen: Soft, nontender and nondistended. No masses or hepatomegaly.  Normal bowel sounds x 4 quadrants.  Rectal: *** Musculoskeletal: Symmetrical with no gross deformities. Extremities: No edema. Neurological: Alert oriented x 4. No focal deficits.  Psychological: Alert and cooperative. Normal mood and affect  Assessment and Recommendations: ***

## 2020-01-24 ENCOUNTER — Ambulatory Visit (INDEPENDENT_AMBULATORY_CARE_PROVIDER_SITE_OTHER): Payer: Medicaid Other | Admitting: Bariatrics

## 2020-01-24 ENCOUNTER — Encounter (INDEPENDENT_AMBULATORY_CARE_PROVIDER_SITE_OTHER): Payer: Self-pay | Admitting: Bariatrics

## 2020-01-24 ENCOUNTER — Other Ambulatory Visit: Payer: Self-pay

## 2020-01-24 VITALS — BP 131/86 | HR 82 | Temp 98.1°F | Ht 68.0 in | Wt 311.0 lb

## 2020-01-24 DIAGNOSIS — Z6841 Body Mass Index (BMI) 40.0 and over, adult: Secondary | ICD-10-CM

## 2020-01-24 DIAGNOSIS — G43809 Other migraine, not intractable, without status migrainosus: Secondary | ICD-10-CM

## 2020-01-24 DIAGNOSIS — E559 Vitamin D deficiency, unspecified: Secondary | ICD-10-CM | POA: Diagnosis not present

## 2020-01-24 DIAGNOSIS — R7303 Prediabetes: Secondary | ICD-10-CM

## 2020-01-24 DIAGNOSIS — K219 Gastro-esophageal reflux disease without esophagitis: Secondary | ICD-10-CM

## 2020-01-24 DIAGNOSIS — F3289 Other specified depressive episodes: Secondary | ICD-10-CM

## 2020-01-24 MED ORDER — SUMATRIPTAN SUCCINATE 50 MG PO TABS: 50.0000 mg | ORAL_TABLET | ORAL | 0 refills | Status: DC | PRN

## 2020-01-24 MED ORDER — METFORMIN HCL 1000 MG PO TABS: 1000.0000 mg | ORAL_TABLET | Freq: Two times a day (BID) | ORAL | 0 refills | Status: DC

## 2020-01-24 MED ORDER — VITAMIN D (ERGOCALCIFEROL) 1.25 MG (50000 UNIT) PO CAPS: 50000.0000 [IU] | ORAL_CAPSULE | ORAL | 0 refills | Status: DC

## 2020-01-24 MED ORDER — SERTRALINE HCL 50 MG PO TABS: 50.0000 mg | ORAL_TABLET | Freq: Every day | ORAL | 0 refills | Status: DC

## 2020-01-24 MED ORDER — TOPIRAMATE 50 MG PO TABS: ORAL_TABLET | ORAL | 0 refills | Status: DC

## 2020-01-24 MED ORDER — BUPROPION HCL ER (SR) 150 MG PO TB12: 150.0000 mg | ORAL_TABLET | Freq: Every day | ORAL | 0 refills | Status: DC

## 2020-01-24 NOTE — Progress Notes (Signed)
Chief Complaint:   INTERPRETER: Cindy Dixon ( present throughout the entire visit).   OBESITY Cindy Dixon is here to discuss her progress with her obesity treatment plan along with follow-up of her obesity related diagnoses. Cindy Dixon is on the Category 3 Plan and states she is following her eating plan approximately 40% of the time. Cindy Dixon states she is exercising 0 minutes 0 times per week.  Today's visit was #: 8 Starting weight: 321 lbs Starting date: 10/11/2019 Today's weight: 311 lbs Today's date: 01/24/2020 Total lbs lost to date: 10 Total lbs lost since last in-office visit: 2  Interim History: Cindy Dixon is down 2 lbs since her last visit.  Subjective:   Vitamin D deficiency. No nausea, vomiting, or muscle weakness.    Ref. Range 10/11/2019 10:40  Vitamin D, 25-Hydroxy Latest Ref Range: 30.0 - 100.0 ng/mL 18.6 (L)   Prediabetes. Cindy Dixon has a diagnosis of prediabetes based on her elevated HgA1c and was informed this puts her at greater risk of developing diabetes. She continues to work on diet and exercise to decrease her risk of diabetes. She denies nausea or hypoglycemia. Cindy Dixon is taking Glucophage.   Lab Results  Component Value Date   HGBA1C 6.0 (H) 10/11/2019   Lab Results  Component Value Date   INSULIN 34.1 (H) 10/11/2019   Other depression, with emotional eating. Cindy Dixon is struggling with emotional eating and using food for comfort to the extent that it is negatively impacting her health. She has been working on behavior modification techniques to help reduce her emotional eating and has been somewhat successful. She shows no sign of suicidal or homicidal ideations. Cindy Dixon endorses cravings. She is on Wellbutrin and denies contraindications.  Gastroesophageal reflux disease, unspecified whether esophagitis present. Cindy Dixon is taking Dexilant, which she states "helps a lot."  Other migraine without status migrainosus, not  intractable. Cindy Dixon states migraines have improved.  Assessment/Plan:   Vitamin D deficiency. Low Vitamin D level contributes to fatigue and are associated with obesity, breast, and colon cancer. She was given a prescription for Vitamin D, Ergocalciferol, (DRISDOL) 1.25 MG (50000 UNIT) CAPS capsule every week #4 with 0 refills and will follow-up for routine testing of Vitamin D, at least 2-3 times per year to avoid over-replacement.    Prediabetes. Cindy Dixon will continue to work on weight loss, exercise, and decreasing simple carbohydrates and refined sweets to help decrease the risk of diabetes. Prescription was given for metformin 500 mg with meals #60 with 0 refills.  Other depression, with emotional eating. Behavior modification techniques were discussed today to help Cindy Dixon deal with her emotional/non-hunger eating behaviors.  Orders and follow up as documented in patient record.  Prescriptions were given for Wellbutrin 150 mg 1 PO daily #30 with 0 refills and topiramate (TOPAMAX) 50 MG tablet 1 PO evening meal #30 with 0 refills.  Gastroesophageal reflux disease, unspecified whether esophagitis present. Intensive lifestyle modifications are the first line treatment for this issue. We discussed several lifestyle modifications today and she will continue to work on diet, exercise and weight loss efforts. Orders and follow up as documented in patient record. Prescription was given for Dexilant 60 mg 1 PO daily #30 with 0 refills.  Counseling . If a person has gastroesophageal reflux disease (GERD), food and stomach acid move back up into the esophagus and cause symptoms or problems such as damage to the esophagus. . Anti-reflux measures include: raising the head of the bed, avoiding tight clothing or belts, avoiding eating late  at night, not lying down shortly after mealtime, and achieving weight loss. . Avoid ASA, NSAID's, caffeine, alcohol, and tobacco.  . OTC Pepcid and/or Tums are  often very helpful for as needed use.  Marland Kitchen However, for persisting chronic or daily symptoms, stronger medications like Omeprazole may be needed. . You may need to avoid foods and drinks such as: ? Coffee and tea (with or without caffeine). ? Drinks that contain alcohol. ? Energy drinks and sports drinks. ? Bubbly (carbonated) drinks or sodas. ? Chocolate and cocoa. ? Peppermint and mint flavorings. ? Garlic and onions. ? Horseradish. ? Spicy and acidic foods. These include peppers, chili powder, curry powder, vinegar, hot sauces, and BBQ sauce. ? Citrus fruit juices and citrus fruits, such as oranges, lemons, and limes. ? Tomato-based foods. These include red sauce, chili, salsa, and pizza with red sauce. ? Fried and fatty foods. These include donuts, french fries, potato chips, and high-fat dressings. ? High-fat meats. These include hot dogs, rib eye steak, sausage, ham, and bacon.  Other migraine without status migrainosus, not intractable. Prescription was given for SUMAtriptan (IMITREX) 50 MG tablet to be taken as needed and can repeat in 2 hours if needed #10 tablets with 0 refills.  Class 3 severe obesity with serious comorbidity and body mass index (BMI) of 45.0 to 49.9 in adult, unspecified obesity type (HCC).  Cindy Dixon is currently in the action stage of change. As such, her goal is to continue with weight loss efforts. She has agreed to the Category 3 Plan.   She will adhere more closely to the plan.  Exercise goals: All adults should avoid inactivity. Some physical activity is better than none, and adults who participate in any amount of physical activity gain some health benefits.  Behavioral modification strategies: increasing lean protein intake, decreasing simple carbohydrates, increasing vegetables, increasing water intake, decreasing eating out, no skipping meals, meal planning and cooking strategies, keeping healthy foods in the home and planning for  success.  Cindy Dixon has agreed to follow-up with our clinic in 3 weeks. She was informed of the importance of frequent follow-up visits to maximize her success with intensive lifestyle modifications for her multiple health conditions.   Objective:   Blood pressure 131/86, pulse 82, temperature 98.1 F (36.7 C), height 5\' 8"  (1.727 m), weight (!) 311 lb (141.1 kg), SpO2 96 %. Body mass index is 47.29 kg/m.  General: Cooperative, alert, well developed, in no acute distress. HEENT: Conjunctivae and lids unremarkable. Cardiovascular: Regular rhythm.  Lungs: Normal work of breathing. Neurologic: No focal deficits.   Lab Results  Component Value Date   CREATININE 0.66 10/11/2019   BUN 12 10/11/2019   NA 139 10/11/2019   K 4.5 10/11/2019   CL 102 10/11/2019   CO2 25 10/11/2019   Lab Results  Component Value Date   ALT 18 10/11/2019   AST 17 10/11/2019   ALKPHOS 89 10/11/2019   BILITOT 0.2 10/11/2019   Lab Results  Component Value Date   HGBA1C 6.0 (H) 10/11/2019   HGBA1C 5.7 (H) 11/12/2018   Lab Results  Component Value Date   INSULIN 34.1 (H) 10/11/2019   Lab Results  Component Value Date   TSH 2.540 10/11/2019   Lab Results  Component Value Date   CHOL 172 10/11/2019   HDL 36 (L) 10/11/2019   LDLCALC 108 (H) 10/11/2019   TRIG 155 (H) 10/11/2019   CHOLHDL 4.0 11/12/2018   Lab Results  Component Value Date   WBC 9.0 10/11/2019  HGB 14.3 10/11/2019   HCT 43.9 10/11/2019   MCV 84 10/11/2019   PLT 319 10/11/2019   No results found for: IRON, TIBC, FERRITIN  Attestation Statements:   Reviewed by clinician on day of visit: allergies, medications, problem list, medical history, surgical history, family history, social history, and previous encounter notes.  Fernanda Drum, am acting as Energy manager for [employer], DO   I have reviewed the above documentation for accuracy and completeness, and I agree with the above. Corinna Capra, DO

## 2020-01-25 ENCOUNTER — Encounter (INDEPENDENT_AMBULATORY_CARE_PROVIDER_SITE_OTHER): Payer: Self-pay | Admitting: Bariatrics

## 2020-02-02 DIAGNOSIS — Z23 Encounter for immunization: Secondary | ICD-10-CM | POA: Diagnosis not present

## 2020-02-14 ENCOUNTER — Other Ambulatory Visit (INDEPENDENT_AMBULATORY_CARE_PROVIDER_SITE_OTHER): Payer: Self-pay | Admitting: Bariatrics

## 2020-02-14 DIAGNOSIS — E559 Vitamin D deficiency, unspecified: Secondary | ICD-10-CM

## 2020-02-14 DIAGNOSIS — F3289 Other specified depressive episodes: Secondary | ICD-10-CM

## 2020-02-14 NOTE — Telephone Encounter (Signed)
Dr Brown pt °

## 2020-02-14 NOTE — Telephone Encounter (Signed)
Refill request

## 2020-02-16 ENCOUNTER — Ambulatory Visit (INDEPENDENT_AMBULATORY_CARE_PROVIDER_SITE_OTHER): Payer: Medicaid Other | Admitting: Bariatrics

## 2020-02-23 ENCOUNTER — Other Ambulatory Visit: Payer: Self-pay

## 2020-02-23 ENCOUNTER — Ambulatory Visit (INDEPENDENT_AMBULATORY_CARE_PROVIDER_SITE_OTHER): Payer: Medicaid Other | Admitting: Bariatrics

## 2020-02-23 VITALS — BP 131/85 | HR 68 | Temp 97.8°F | Ht 68.0 in | Wt 309.0 lb

## 2020-02-23 DIAGNOSIS — F3289 Other specified depressive episodes: Secondary | ICD-10-CM

## 2020-02-23 DIAGNOSIS — R7303 Prediabetes: Secondary | ICD-10-CM | POA: Diagnosis not present

## 2020-02-23 DIAGNOSIS — G43809 Other migraine, not intractable, without status migrainosus: Secondary | ICD-10-CM | POA: Diagnosis not present

## 2020-02-23 DIAGNOSIS — Z6841 Body Mass Index (BMI) 40.0 and over, adult: Secondary | ICD-10-CM

## 2020-02-23 DIAGNOSIS — E559 Vitamin D deficiency, unspecified: Secondary | ICD-10-CM | POA: Diagnosis not present

## 2020-02-23 DIAGNOSIS — N912 Amenorrhea, unspecified: Secondary | ICD-10-CM

## 2020-02-23 MED ORDER — VITAMIN D (ERGOCALCIFEROL) 1.25 MG (50000 UNIT) PO CAPS
50000.0000 [IU] | ORAL_CAPSULE | ORAL | 0 refills | Status: DC
Start: 2020-02-23 — End: 2020-03-27

## 2020-02-23 MED ORDER — SERTRALINE HCL 50 MG PO TABS: 50.0000 mg | ORAL_TABLET | Freq: Every day | ORAL | 0 refills | Status: DC

## 2020-02-23 MED ORDER — SUMATRIPTAN SUCCINATE 50 MG PO TABS
50.0000 mg | ORAL_TABLET | ORAL | 0 refills | Status: DC | PRN
Start: 2020-02-23 — End: 2020-05-08

## 2020-02-23 MED ORDER — METFORMIN HCL 1000 MG PO TABS: 1000.0000 mg | ORAL_TABLET | Freq: Two times a day (BID) | ORAL | 0 refills | Status: DC

## 2020-02-24 LAB — PREGNANCY, URINE: Preg Test, Ur: NEGATIVE

## 2020-02-24 NOTE — Progress Notes (Signed)
Pt advised.

## 2020-02-28 NOTE — Progress Notes (Signed)
Chief Complaint:   OBESITY Cindy Dixon is here to discuss her progress with her obesity treatment plan along with follow-up of her obesity related diagnoses. Cindy Dixon is on the Category 3 Plan and states she is following her eating plan approximately 30% of the time. [name] states she is not exercising regularly at this time.  Today's visit was #: 9 Starting weight: 321 lbs Starting date: 10/11/2019 Today's weight: 309 lbs Today's date: 02/23/2020 Total lbs lost to date: 12 lbs Total lbs lost since last in-office visit: 2 lbs  Interim History: [name] has been doing well.  She says she has had anxiety episodes 1-2 times per week.  She has been stressed due to losing her job.  Subjective:   1. Vitamin D deficiency Cindy Dixon's Vitamin D level was 18.6 on 10/11/2019. She is currently taking prescription vitamin D 50,000 IU each week. She denies nausea, vomiting or muscle weakness.  She gets minimal sun exposure.  2. Prediabetes Cindy Dixon has a diagnosis of prediabetes based on her elevated HgA1c and was informed this puts her at greater risk of developing diabetes. She continues to work on diet and exercise to decrease her risk of diabetes. She denies nausea or hypoglycemia.  Denies excessive hunger.  Lab Results  Component Value Date   HGBA1C 6.0 (H) 10/11/2019   Lab Results  Component Value Date   INSULIN 34.1 (H) 10/11/2019   3. Other migraine without status migrainosus, not intractable Celesta endorses ongoing migraine headaches.  4. Amenorrhea Donia has had 2 missed periods.  She had BTL 8 years ago.  5. Other depression, with emotional eating Cindy Dixon is struggling with emotional eating and using food for comfort to the extent that it is negatively impacting her health. She has been working on behavior modification techniques to help reduce her emotional eating and has been minimally successful. She shows no sign of suicidal or homicidal  ideations.  Assessment/Plan:   1. Vitamin D deficiency Low Vitamin D level contributes to fatigue and are associated with obesity, breast, and colon cancer. She agrees to continue to take prescription Vitamin D @50 ,000 IU every week and will follow-up for routine testing of Vitamin D, at least 2-3 times per year to avoid over-replacement.  -Refill Vitamin D, Ergocalciferol, (DRISDOL) 1.25 MG (50000 UNIT) CAPS capsule; Take 1 capsule (50,000 Units total) by mouth every 7 (seven) days.  Dispense: 4 capsule; Refill: 0  2. Prediabetes Shevon will continue to work on weight loss, exercise, and decreasing simple carbohydrates to help decrease the risk of diabetes.  Will refill metformin today, as per below.   -Refill metFORMIN (GLUCOPHAGE) 1000 MG tablet; Take 1 tablet (1,000 mg total) by mouth 2 (two) times daily with a meal.  Dispense: 60 tablet; Refill: 0  3. Other migraine without status migrainosus, not intractable Will refill Imitrext today, as per below.  Will also place referral to Neurology for evaluation and treatment of migraines.  -Refill SUMAtriptan (IMITREX) 50 MG tablet; Take 1 tablet (50 mg total) by mouth as needed for migraine. May repeat in 2 hours if headache persists or recurs. Do not exceed 2 tablets in 24 hours.  Dispense: 10 tablet; Refill: 0  4. Amenorrhea Will check pregnancy test today.  Will also place referrals for OB/GYN and primary care.  - Pregnancy, urine  5. Other depression, with emotional eating Behavior modification techniques were discussed today to help [name] deal with her emotional/non-hunger eating behaviors.  Orders and follow up as documented in patient record.  Will refill Zoloft today, as per below.  -Refill sertraline (ZOLOFT) 50 MG tablet; Take 1 tablet (50 mg total) by mouth daily.  Dispense: 30 tablet; Refill: 0  6. Class 3 severe obesity with serious comorbidity and body mass index (BMI) of 45.0 to 49.9 in adult, unspecified obesity  type (HCC)  Cindy Dixon is currently in the action stage of change. As such, her goal is to continue with weight loss efforts. She has agreed to the Category 3 Plan.   She will work on meal planning and intentional eating.  Exercise goals: All adults should avoid inactivity. Some physical activity is better than none, and adults who participate in any amount of physical activity gain some health benefits.  Behavioral modification strategies: increasing lean protein intake, decreasing simple carbohydrates, increasing vegetables, increasing water intake, decreasing eating out, no skipping meals, meal planning and cooking strategies, keeping healthy foods in the home and planning for success.  Cindy Dixon has agreed to follow-up with our clinic in 2-3 weeks. She was informed of the importance of frequent follow-up visits to maximize her success with intensive lifestyle modifications for her multiple health conditions.   Cindy Dixon was informed we would discuss her lab results at her next visit unless there is a critical issue that needs to be addressed sooner. Cindy Dixon agreed to keep her next visit at the agreed upon time to discuss these results.  Objective:   Blood pressure 131/85, pulse 68, temperature 97.8 F (36.6 C), height 5\' 8"  (1.727 m), weight (!) 309 lb (140.2 kg), SpO2 96 %. Body mass index is 46.98 kg/m.  General: Cooperative, alert, well developed, in no acute distress. HEENT: Conjunctivae and lids unremarkable. Cardiovascular: Regular rhythm.  Lungs: Normal work of breathing. Neurologic: No focal deficits.   Lab Results  Component Value Date   CREATININE 0.66 10/11/2019   BUN 12 10/11/2019   NA 139 10/11/2019   K 4.5 10/11/2019   CL 102 10/11/2019   CO2 25 10/11/2019   Lab Results  Component Value Date   ALT 18 10/11/2019   AST 17 10/11/2019   ALKPHOS 89 10/11/2019   BILITOT 0.2 10/11/2019   Lab Results  Component Value Date   HGBA1C 6.0 (H) 10/11/2019    HGBA1C 5.7 (H) 11/12/2018   Lab Results  Component Value Date   INSULIN 34.1 (H) 10/11/2019   Lab Results  Component Value Date   TSH 2.540 10/11/2019   Lab Results  Component Value Date   CHOL 172 10/11/2019   HDL 36 (L) 10/11/2019   LDLCALC 108 (H) 10/11/2019   TRIG 155 (H) 10/11/2019   CHOLHDL 4.0 11/12/2018   Lab Results  Component Value Date   WBC 9.0 10/11/2019   HGB 14.3 10/11/2019   HCT 43.9 10/11/2019   MCV 84 10/11/2019   PLT 319 10/11/2019   Attestation Statements:   Reviewed by clinician on day of visit: allergies, medications, problem list, medical history, surgical history, family history, social history, and previous encounter notes.  I, 10/13/2019, CMA, am acting as Insurance claims handler for Energy manager, DO  I have reviewed the above documentation for accuracy and completeness, and I agree with the above. [employer], DO

## 2020-03-01 ENCOUNTER — Encounter (INDEPENDENT_AMBULATORY_CARE_PROVIDER_SITE_OTHER): Payer: Self-pay | Admitting: Bariatrics

## 2020-03-09 ENCOUNTER — Ambulatory Visit (INDEPENDENT_AMBULATORY_CARE_PROVIDER_SITE_OTHER): Payer: Medicaid Other | Admitting: Bariatrics

## 2020-03-09 ENCOUNTER — Other Ambulatory Visit: Payer: Self-pay

## 2020-03-09 ENCOUNTER — Encounter (INDEPENDENT_AMBULATORY_CARE_PROVIDER_SITE_OTHER): Payer: Self-pay | Admitting: Bariatrics

## 2020-03-09 VITALS — BP 130/87 | HR 69 | Temp 98.3°F | Ht 68.0 in | Wt 311.0 lb

## 2020-03-09 DIAGNOSIS — N912 Amenorrhea, unspecified: Secondary | ICD-10-CM | POA: Diagnosis not present

## 2020-03-09 DIAGNOSIS — F5089 Other specified eating disorder: Secondary | ICD-10-CM | POA: Diagnosis not present

## 2020-03-09 DIAGNOSIS — Z6841 Body Mass Index (BMI) 40.0 and over, adult: Secondary | ICD-10-CM | POA: Diagnosis not present

## 2020-03-09 DIAGNOSIS — R7303 Prediabetes: Secondary | ICD-10-CM

## 2020-03-11 IMAGING — MR MR HEAD W/O CM
7 series · 48 of 48 positions shown · non-contrast
Comparison: None.

CLINICAL DATA: Severe headache, RIGHT upper extremity tingling
beginning this morning.

EXAM:
MRI HEAD WITHOUT CONTRAST
TECHNIQUE: Sagittal T1, axial T2, axial T2 and T2 FLAIR, axial and coronal
diffusion-weighted imaging obtained. Patient did not wish to
complete the examination due to claustrophobia.

[Series 5: DWI · axial · 4.0mm · 0.88mm/px · z∈[-92,+43]mm · 11 of 70 slices shown (1 of 4)]
[im 1/70]
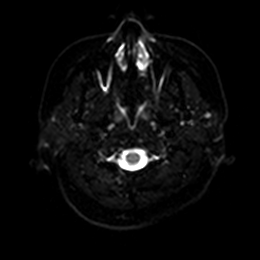
[im 7/70]
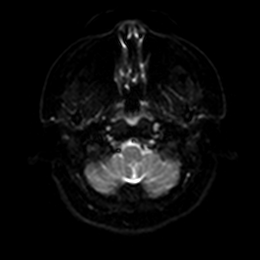
[im 14/70]
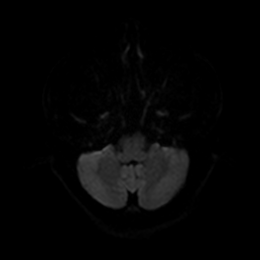
[im 21/70]
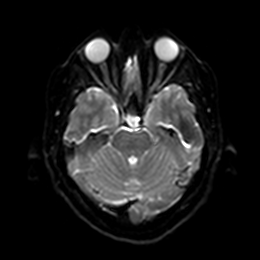
[im 28/70]
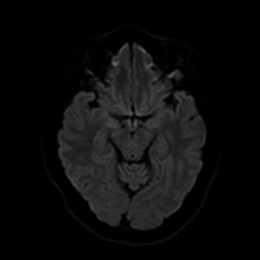
[im 35/70]
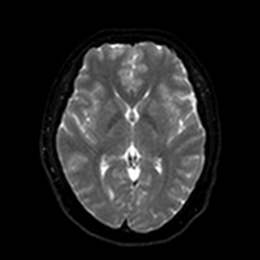
[im 42/70]
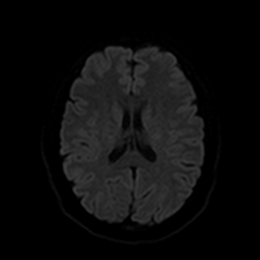
[im 49/70]
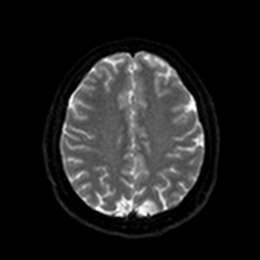
[im 56/70]
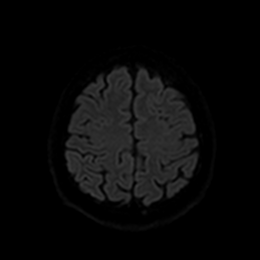
[im 63/70]
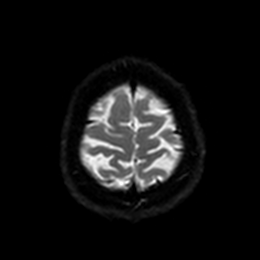
[im 70/70]
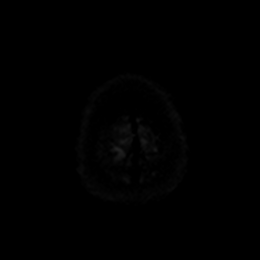

[Series 6: DWI · axial · 4.0mm · 0.88mm/px · z∈[-92,+43]mm · 6 of 35 slices shown (2 of 4)]
[im 1/35]
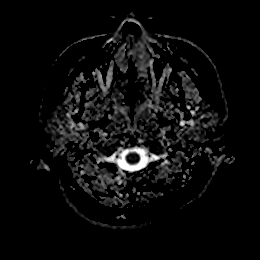
[im 7/35]
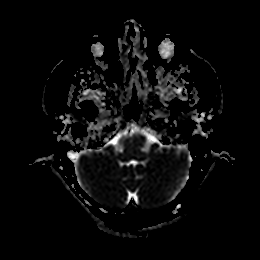
[im 14/35]
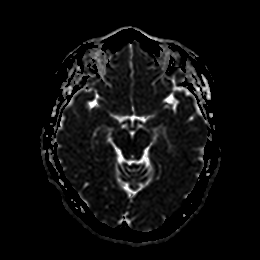
[im 21/35]
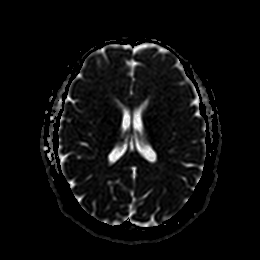
[im 28/35]
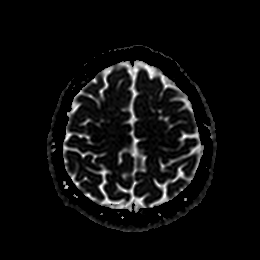
[im 35/35]
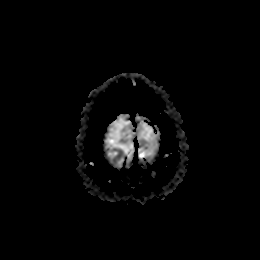

[Series 7: DWI · coronal · 4.0mm · 0.88mm/px · 11 of 64 slices shown (3 of 4)]
[im 1/64]
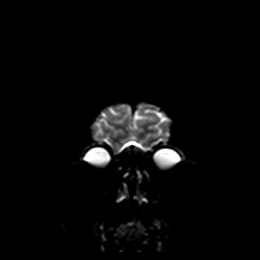
[im 7/64]
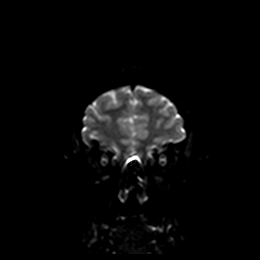
[im 13/64]
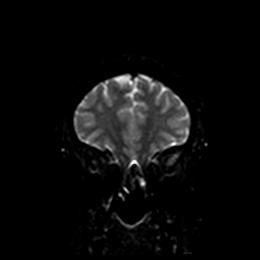
[im 19/64]
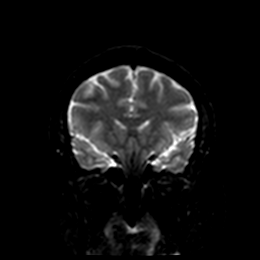
[im 26/64]
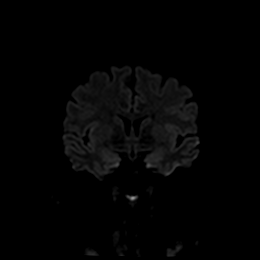
[im 32/64]
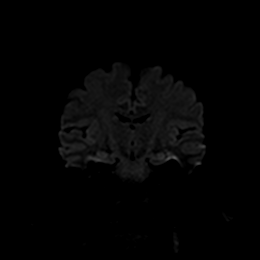
[im 38/64]
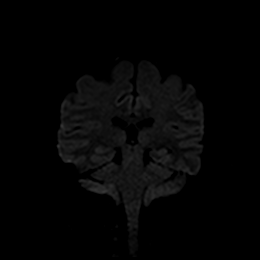
[im 45/64]
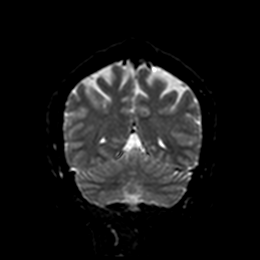
[im 51/64]
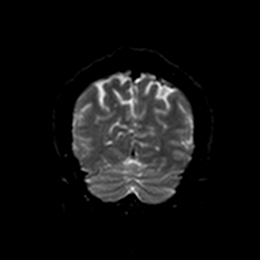
[im 57/64]
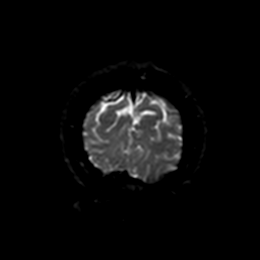
[im 64/64]
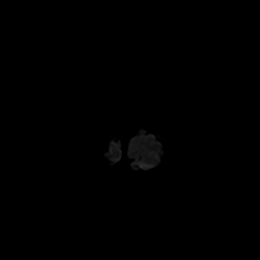

[Series 8: DWI · coronal · 4.0mm · 0.88mm/px · 6 of 32 slices shown (4 of 4)]
[im 1/32]
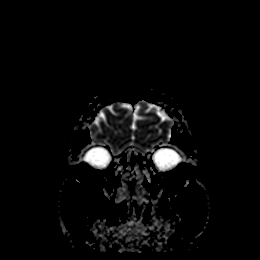
[im 7/32]
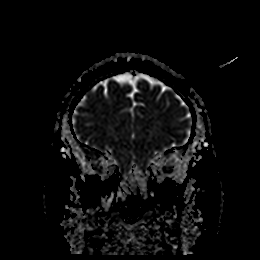
[im 13/32]
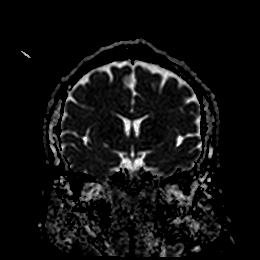
[im 19/32]
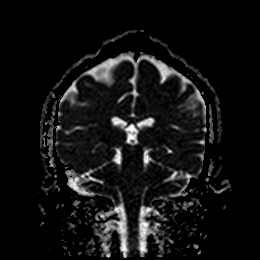
[im 25/32]
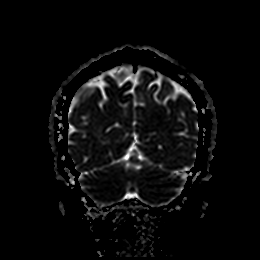
[im 32/32]
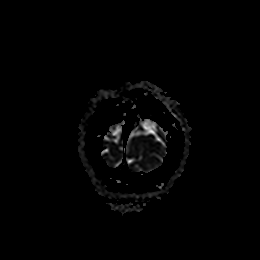

[Series 9: T1 · sagittal · 5.0mm · 0.75mm/px · 4 of 24 slices shown]
[im 1/24]
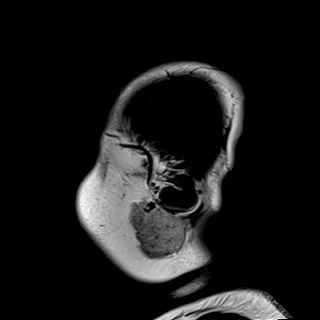
[im 8/24]
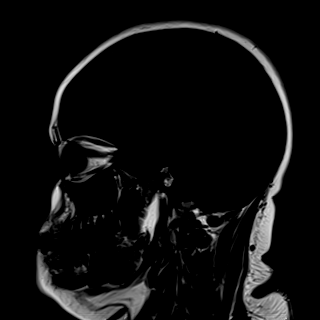
[im 16/24]
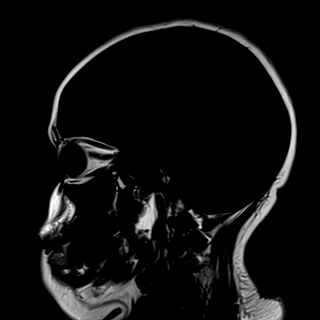
[im 24/24]
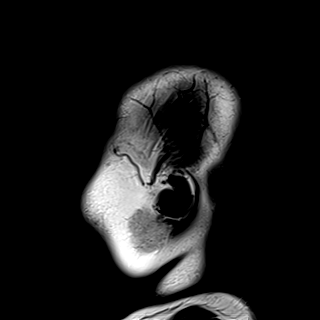

[Series 10: T2 · axial · 5.0mm · 0.69mm/px · z∈[-118,+35]mm · 5 of 27 slices shown]
[im 1/27]
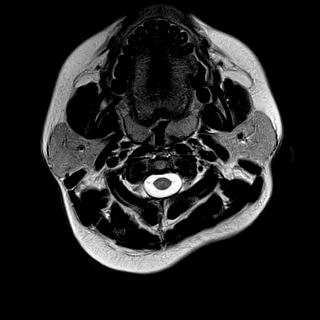
[im 7/27]
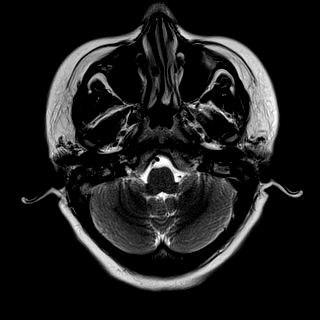
[im 14/27]
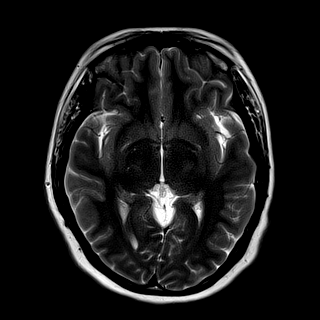
[im 20/27]
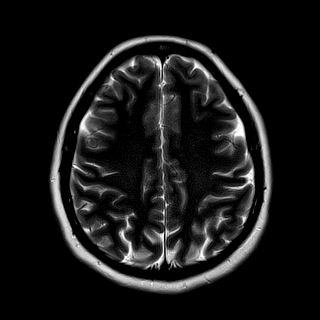
[im 27/27]
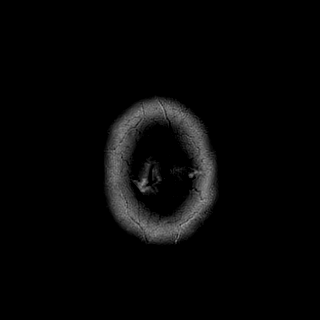

[Series 11: FLAIR · axial · 5.0mm · 0.43mm/px · z∈[-117,+36]mm · 5 of 27 slices shown]
[im 1/27]
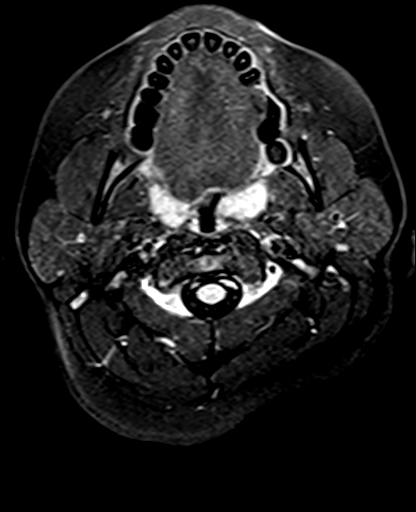
[im 7/27]
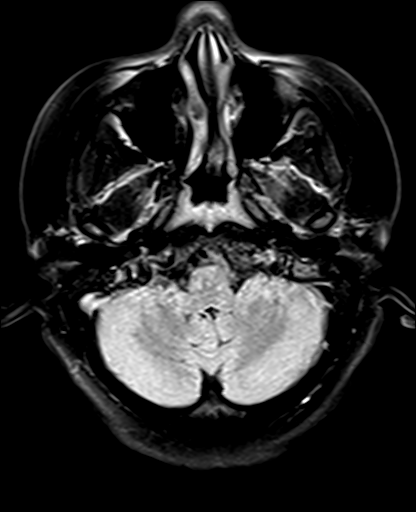
[im 14/27]
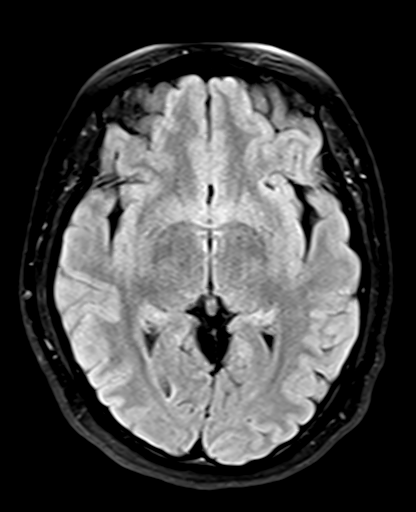
[im 20/27]
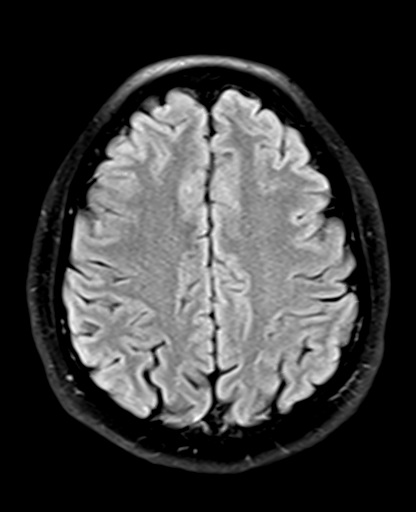
[im 27/27]
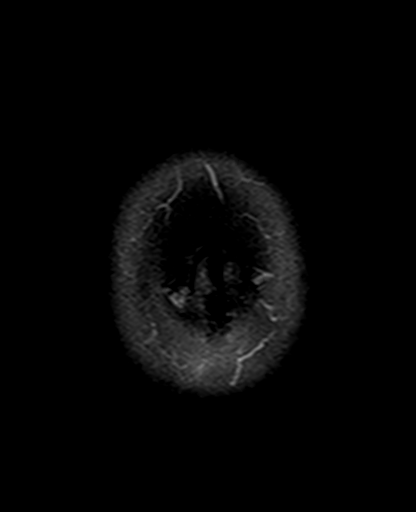

[48 of 48 positions shown; findings below may reference images not displayed]

FINDINGS: INTRACRANIAL CONTENTS: No reduced diffusion to suggest acute
ischemia or hyperacute demyelination. The ventricles and sulci are
normal for patient's age. No suspicious parenchymal signal, masses,
mass effect. No abnormal extra-axial fluid collections. No
extra-axial masses.

VASCULAR: Normal major intracranial vascular flow voids present at
skull base.

SKULL AND UPPER CERVICAL SPINE: No abnormal sellar expansion. No
suspicious calvarial bone marrow signal. Craniocervical junction
maintained.

SINUSES/ORBITS: RIGHT mastoid and potentially RIGHT middle ear
effusion. Paranasal sinuses are well aerated.The included ocular
globes and orbital contents are non-suspicious.

OTHER: None.
IMPRESSION: 1. Normal limited MRI of the head.
2. RIGHT mastoid and potentially RIGHT middle ear effusion.

## 2020-03-13 ENCOUNTER — Encounter (INDEPENDENT_AMBULATORY_CARE_PROVIDER_SITE_OTHER): Payer: Self-pay | Admitting: Bariatrics

## 2020-03-13 NOTE — Progress Notes (Signed)
Chief Complaint:   OBESITY Cindy Dixon is here to discuss her progress with her obesity treatment plan along with follow-up of her obesity related diagnoses. Cindy Dixon is on the Category 3 Plan and states she is following her eating plan approximately 30% of the time. Cindy Dixon states she is not exercising regularly at this time.  She had an interpretor throughout the visit Estill Bamberg ).  Today's visit was #: 10 Starting weight: 321 lbs Starting date: 10/11/2019 Today's weight: 311 lbs Today's date: 03/09/2020 Total lbs lost to date: 10 lbs Total lbs lost since last in-office visit: 0  Interim History: Cindy Dixon is up 2 pounds and doing well overall.  She has been eating adequate protein, but her appetite is still high.  Subjective:   1. Prediabetes Cindy Dixon has a diagnosis of prediabetes based on her elevated HgA1c and was informed this puts her at greater risk of developing diabetes. She continues to work on diet and exercise to decrease her risk of diabetes. She denies nausea or hypoglycemia.  She is taking Glucophage.  She endorses and increased appetite.  Lab Results  Component Value Date   HGBA1C 6.0 (H) 10/11/2019   Lab Results  Component Value Date   INSULIN 34.1 (H) 10/11/2019   2. Amenorrhea Pregnancy test was negative at last visit.  3. Other disorder of eating She is taking Zoloft, Wellbutrin, and Topamax.  Denies homicidal/suicidal ideation.  Assessment/Plan:   1. Prediabetes Cindy Dixon will continue to work on weight loss, exercise, and decreasing simple carbohydrates to help decrease the risk of diabetes.  Continue Glucophage.  2. Amenorrhea Will place referral to OB/GYN today for evaluation and treatment.  - Ambulatory referral to Obstetrics / Gynecology  3. Other disorder of eating Continue medications.  Will place referral to [facility] for Psychiatry.  - Ambulatory referral to Behavioral Health  4. Class 3 severe obesity  with serious comorbidity and body mass index (BMI) of 45.0 to 49.9 in adult, unspecified obesity type (HCC)  Cindy Dixon is currently in the action stage of change. As such, her goal is to continue with weight loss efforts. She has agreed to the Category 3 Plan.   She will work on meal planning, intentional eating, and increasing raw vegetables and salad.  Exercise goals: For substantial health benefits, adults should do at least 150 minutes (2 hours and 30 minutes) a week of moderate-intensity, or 75 minutes (1 hour and 15 minutes) a week of vigorous-intensity aerobic physical activity, or an equivalent combination of moderate- and vigorous-intensity aerobic activity. Aerobic activity should be performed in episodes of at least 10 minutes, and preferably, it should be spread throughout the week.  Behavioral modification strategies: increasing lean protein intake, decreasing simple carbohydrates, increasing vegetables, increasing water intake, decreasing eating out, no skipping meals, meal planning and cooking strategies, keeping healthy foods in the home and planning for success.  Cindy Dixon has agreed to follow-up with our clinic in 2 weeks, fasting. She was informed of the importance of frequent follow-up visits to maximize her success with intensive lifestyle modifications for her multiple health conditions.   Objective:   Blood pressure 130/87, pulse 69, temperature 98.3 F (36.8 C), height 5\' 8"  (1.727 m), weight (!) 311 lb (141.1 kg), SpO2 96 %. Body mass index is 47.29 kg/m.  General: Cooperative, alert, well developed, in no acute distress. HEENT: Conjunctivae and lids unremarkable. Cardiovascular: Regular rhythm.  Lungs: Normal work of breathing. Neurologic: No focal deficits.   Lab Results  Component  Value Date   CREATININE 0.66 10/11/2019   BUN 12 10/11/2019   NA 139 10/11/2019   K 4.5 10/11/2019   CL 102 10/11/2019   CO2 25 10/11/2019   Lab Results  Component Value Date    ALT 18 10/11/2019   AST 17 10/11/2019   ALKPHOS 89 10/11/2019   BILITOT 0.2 10/11/2019   Lab Results  Component Value Date   HGBA1C 6.0 (H) 10/11/2019   HGBA1C 5.7 (H) 11/12/2018   Lab Results  Component Value Date   INSULIN 34.1 (H) 10/11/2019   Lab Results  Component Value Date   TSH 2.540 10/11/2019   Lab Results  Component Value Date   CHOL 172 10/11/2019   HDL 36 (L) 10/11/2019   LDLCALC 108 (H) 10/11/2019   TRIG 155 (H) 10/11/2019   CHOLHDL 4.0 11/12/2018   Lab Results  Component Value Date   WBC 9.0 10/11/2019   HGB 14.3 10/11/2019   HCT 43.9 10/11/2019   MCV 84 10/11/2019   PLT 319 10/11/2019   Attestation Statements:   Reviewed by clinician on day of visit: allergies, medications, problem list, medical history, surgical history, family history, social history, and previous encounter notes.  I, Insurance claims handler, CMA, am acting as Energy manager for [employer], DO  I have reviewed the above documentation for accuracy and completeness, and I agree with the above. Corinna Capra, DO

## 2020-03-13 NOTE — Addendum Note (Signed)
Addended by: Paulla Fore A on: 03/13/2020 01:34 PM   Modules accepted: Orders

## 2020-03-23 ENCOUNTER — Encounter (INDEPENDENT_AMBULATORY_CARE_PROVIDER_SITE_OTHER): Payer: Self-pay

## 2020-03-23 ENCOUNTER — Other Ambulatory Visit: Payer: Self-pay

## 2020-03-23 ENCOUNTER — Encounter (INDEPENDENT_AMBULATORY_CARE_PROVIDER_SITE_OTHER): Payer: Medicaid Other | Admitting: Bariatrics

## 2020-03-23 ENCOUNTER — Encounter (INDEPENDENT_AMBULATORY_CARE_PROVIDER_SITE_OTHER): Payer: Self-pay | Admitting: Bariatrics

## 2020-03-27 ENCOUNTER — Other Ambulatory Visit: Payer: Self-pay

## 2020-03-27 ENCOUNTER — Encounter (INDEPENDENT_AMBULATORY_CARE_PROVIDER_SITE_OTHER): Payer: Self-pay | Admitting: Bariatrics

## 2020-03-27 ENCOUNTER — Ambulatory Visit (INDEPENDENT_AMBULATORY_CARE_PROVIDER_SITE_OTHER): Payer: Medicaid Other | Admitting: Bariatrics

## 2020-03-27 VITALS — BP 125/87 | HR 73 | Temp 98.1°F | Ht 68.0 in | Wt 310.0 lb

## 2020-03-27 DIAGNOSIS — Z6841 Body Mass Index (BMI) 40.0 and over, adult: Secondary | ICD-10-CM | POA: Diagnosis not present

## 2020-03-27 DIAGNOSIS — E559 Vitamin D deficiency, unspecified: Secondary | ICD-10-CM

## 2020-03-27 DIAGNOSIS — R7303 Prediabetes: Secondary | ICD-10-CM | POA: Diagnosis not present

## 2020-03-27 DIAGNOSIS — G43009 Migraine without aura, not intractable, without status migrainosus: Secondary | ICD-10-CM | POA: Diagnosis not present

## 2020-03-27 DIAGNOSIS — F3289 Other specified depressive episodes: Secondary | ICD-10-CM | POA: Diagnosis not present

## 2020-03-27 DIAGNOSIS — G43809 Other migraine, not intractable, without status migrainosus: Secondary | ICD-10-CM | POA: Diagnosis not present

## 2020-03-27 MED ORDER — METFORMIN HCL 1000 MG PO TABS: 1000.0000 mg | ORAL_TABLET | Freq: Two times a day (BID) | ORAL | 0 refills | Status: DC

## 2020-03-27 MED ORDER — TOPIRAMATE 50 MG PO TABS: ORAL_TABLET | ORAL | 0 refills | Status: DC

## 2020-03-27 MED ORDER — VITAMIN D (ERGOCALCIFEROL) 1.25 MG (50000 UNIT) PO CAPS: 50000.0000 [IU] | ORAL_CAPSULE | ORAL | 0 refills | Status: DC

## 2020-03-27 MED ORDER — BUPROPION HCL ER (SR) 150 MG PO TB12: 150.0000 mg | ORAL_TABLET | Freq: Every day | ORAL | 0 refills | Status: DC

## 2020-03-29 NOTE — Progress Notes (Signed)
Chief Complaint:   OBESITY Cindy Dixon is here to discuss her progress with her obesity treatment plan along with follow-up of her obesity related diagnoses. Cindy Dixon is on the Category 3 Plan and states she is following her eating plan approximately 80% of the time. Cindy Dixon states she is not exercising regularly at this time.  Today's visit was #: 11 Starting weight: 321 lbs Starting date: 10/11/2019 Today's weight: 310 lbs Today's date: 03/27/2020 Total lbs lost to date: 11 lbs Total lbs lost since last in-office visit: 1 lb  Interim History: Cindy Dixon is up 1 pound.  Subjective:   1. Vitamin D deficiency Cindy Dixon's Vitamin D level was 18.6 on 10/11/2019. She is currently taking prescription vitamin D 50,000 IU each week. She denies nausea, vomiting or muscle weakness.  2. Prediabetes Cindy Dixon has a diagnosis of prediabetes based on her elevated HgA1c and was informed this puts her at greater risk of developing diabetes. She continues to work on diet and exercise to decrease her risk of diabetes. She denies nausea or hypoglycemia.  She is taking metformin.  She endorses increased appetite.  Lab Results  Component Value Date   HGBA1C 6.0 (H) 10/11/2019   Lab Results  Component Value Date   INSULIN 34.1 (H) 10/11/2019   3. Other migraine without status migrainosus, not intractable.   4. Other depression, with emotional eating Cindy Dixon is taking Wellbutrin and Topamax.  Cindy Dixon is struggling with emotional eating and using food for comfort to the extent that it is negatively impacting her health. She has been working on behavior modification techniques to help reduce her emotional eating and has been minimally successful. She shows no sign of suicidal or homicidal ideations.  Assessment/Plan:   1. Vitamin D deficiency Low Vitamin D level contributes to fatigue and are associated with obesity, breast, and colon cancer. She agrees to continue to take  prescription Vitamin D @50 ,000 IU every week and will follow-up for routine testing of Vitamin D, at least 2-3 times per year to avoid over-replacement.  - Refill Vitamin D, Ergocalciferol, (DRISDOL) 1.25 MG (50000 UNIT) CAPS capsule; Take 1 capsule (50,000 Units total) by mouth every 7 (seven) days.  Dispense: 4 capsule; Refill: 0  2. Prediabetes Cindy Dixon will continue to work on weight loss, exercise, and decreasing simple carbohydrates to help decrease the risk of diabetes.  Continue metformin.  - Refill metFORMIN (GLUCOPHAGE) 1000 MG tablet; Take 1 tablet (1,000 mg total) by mouth 2 (two) times daily with a meal.  Dispense: 60 tablet; Refill: 0  3. Other migraine without status migrainosus, not intractable Will place referral to Neurology today.  She will continue Imitrex for rescue.  We discussed triggers.  - Ambulatory referral to Neurology  4. Other depression, with emotional eating Behavior modification techniques were discussed today to help Cindy Dixon deal with her emotional/non-hunger eating behaviors.  Orders and follow up as documented in patient record.  She will continue Wellbutrin and Topamax.  - Refill buPROPion (WELLBUTRIN SR) 150 MG 12 hr tablet; Take 1 tablet (150 mg total) by mouth daily.  Dispense: 30 tablet; Refill: 0 - Refill topiramate (TOPAMAX) 50 MG tablet; Take with her evening meal  Dispense: 30 tablet; Refill: 0  5. Class 3 severe obesity with serious comorbidity and body mass index (BMI) of 45.0 to 49.9 in adult, unspecified obesity type (HCC)  Cindy Dixon is currently in the action stage of change. As such, her goal is to continue with weight loss efforts. She has agreed to the Category  3 Plan.   She will work on meal planning and decreasing carbohydrates and increasing protein.  Exercise goals: For substantial health benefits, adults should do at least 150 minutes (2 hours and 30 minutes) a week of moderate-intensity, or 75 minutes (1 hour and 15 minutes) a  week of vigorous-intensity aerobic physical activity, or an equivalent combination of moderate- and vigorous-intensity aerobic activity. Aerobic activity should be performed in episodes of at least 10 minutes, and preferably, it should be spread throughout the week.  Behavioral modification strategies: increasing lean protein intake, decreasing simple carbohydrates, increasing vegetables and increasing water intake.  Cindy Dixon has agreed to follow-up with our clinic in 2-3 weeks, fasting. She was informed of the importance of frequent follow-up visits to maximize her success with intensive lifestyle modifications for her multiple health conditions.   Objective:   Blood pressure 125/87, pulse 73, temperature 98.1 F (36.7 C), height 5\' 8"  (1.727 m), weight (!) 310 lb (140.6 kg), SpO2 95 %. Body mass index is 47.14 kg/m.  General: Cooperative, alert, well developed, in no acute distress. HEENT: Conjunctivae and lids unremarkable. Cardiovascular: Regular rhythm.  Lungs: Normal work of breathing. Neurologic: No focal deficits.   Lab Results  Component Value Date   CREATININE 0.66 10/11/2019   BUN 12 10/11/2019   NA 139 10/11/2019   K 4.5 10/11/2019   CL 102 10/11/2019   CO2 25 10/11/2019   Lab Results  Component Value Date   ALT 18 10/11/2019   AST 17 10/11/2019   ALKPHOS 89 10/11/2019   BILITOT 0.2 10/11/2019   Lab Results  Component Value Date   HGBA1C 6.0 (H) 10/11/2019   HGBA1C 5.7 (H) 11/12/2018   Lab Results  Component Value Date   INSULIN 34.1 (H) 10/11/2019   Lab Results  Component Value Date   TSH 2.540 10/11/2019   Lab Results  Component Value Date   CHOL 172 10/11/2019   HDL 36 (L) 10/11/2019   LDLCALC 108 (H) 10/11/2019   TRIG 155 (H) 10/11/2019   CHOLHDL 4.0 11/12/2018   Lab Results  Component Value Date   WBC 9.0 10/11/2019   HGB 14.3 10/11/2019   HCT 43.9 10/11/2019   MCV 84 10/11/2019   PLT 319 10/11/2019   Attestation Statements:    Reviewed by clinician on day of visit: allergies, medications, problem list, medical history, surgical history, family history, social history, and previous encounter notes.  I, 10/13/2019, CMA, am acting as Insurance claims handler for Energy manager, DO  I have reviewed the above documentation for accuracy and completeness, and I agree with the above. [employer], DO

## 2020-04-02 ENCOUNTER — Encounter (INDEPENDENT_AMBULATORY_CARE_PROVIDER_SITE_OTHER): Payer: Self-pay | Admitting: Bariatrics

## 2020-04-02 DIAGNOSIS — G43909 Migraine, unspecified, not intractable, without status migrainosus: Secondary | ICD-10-CM | POA: Insufficient documentation

## 2020-04-02 NOTE — Progress Notes (Signed)
This encounter was created in error - please disregard.

## 2020-04-05 ENCOUNTER — Emergency Department (HOSPITAL_COMMUNITY): Payer: Medicaid Other

## 2020-04-05 ENCOUNTER — Encounter (HOSPITAL_COMMUNITY): Payer: Self-pay | Admitting: Emergency Medicine

## 2020-04-05 ENCOUNTER — Emergency Department (HOSPITAL_COMMUNITY)
Admission: EM | Admit: 2020-04-05 | Discharge: 2020-04-05 | Disposition: A | Discharge status: Home / Self Care | Payer: Medicaid Other | Attending: Emergency Medicine | Admitting: Emergency Medicine

## 2020-04-05 DIAGNOSIS — R102 Pelvic and perineal pain: Secondary | ICD-10-CM

## 2020-04-05 DIAGNOSIS — R7303 Prediabetes: Secondary | ICD-10-CM | POA: Diagnosis not present

## 2020-04-05 DIAGNOSIS — Z7984 Long term (current) use of oral hypoglycemic drugs: Secondary | ICD-10-CM | POA: Insufficient documentation

## 2020-04-05 DIAGNOSIS — R110 Nausea: Secondary | ICD-10-CM | POA: Diagnosis not present

## 2020-04-05 DIAGNOSIS — R1084 Generalized abdominal pain: Secondary | ICD-10-CM | POA: Diagnosis not present

## 2020-04-05 DIAGNOSIS — R42 Dizziness and giddiness: Secondary | ICD-10-CM | POA: Diagnosis not present

## 2020-04-05 DIAGNOSIS — R1030 Lower abdominal pain, unspecified: Secondary | ICD-10-CM | POA: Diagnosis not present

## 2020-04-05 DIAGNOSIS — N939 Abnormal uterine and vaginal bleeding, unspecified: Secondary | ICD-10-CM | POA: Diagnosis present

## 2020-04-05 LAB — COMPREHENSIVE METABOLIC PANEL
ALT: 22 U/L (ref 0–44)
AST: 21 U/L (ref 15–41)
Albumin: 3.7 g/dL (ref 3.5–5.0)
Alkaline Phosphatase: 58 U/L (ref 38–126)
Anion gap: 11 (ref 5–15)
BUN: 9 mg/dL (ref 6–20)
CO2: 23 mmol/L (ref 22–32)
Calcium: 8.9 mg/dL (ref 8.9–10.3)
Chloride: 104 mmol/L (ref 98–111)
Creatinine, Ser: 0.64 mg/dL (ref 0.44–1.00)
GFR, Estimated: >60 mL/min (ref >60)
Glucose, Bld: 115 mg/dL — ABNORMAL HIGH (ref 70–99)
Potassium: 3.7 mmol/L (ref 3.5–5.1)
Sodium: 138 mmol/L (ref 135–145)
Total Bilirubin: 0.4 mg/dL (ref 0.3–1.2)
Total Protein: 6.9 g/dL (ref 6.5–8.1)

## 2020-04-05 LAB — CBC WITH DIFFERENTIAL/PLATELET
Abs Immature Granulocytes: 0.05 10*3/uL (ref 0.00–0.07)
Basophils Absolute: 0 10*3/uL (ref 0.0–0.1)
Basophils Relative: 0 %
Eosinophils Absolute: 0.1 10*3/uL (ref 0.0–0.5)
Eosinophils Relative: 1 %
HCT: 39.1 % (ref 36.0–46.0)
Hemoglobin: 13 g/dL (ref 12.0–15.0)
Immature Granulocytes: 1 %
Lymphocytes Relative: 27 %
Lymphs Abs: 3 10*3/uL (ref 0.7–4.0)
MCH: 28.2 pg (ref 26.0–34.0)
MCHC: 33.2 g/dL (ref 30.0–36.0)
MCV: 84.8 fL (ref 80.0–100.0)
Monocytes Absolute: 0.6 10*3/uL (ref 0.1–1.0)
Monocytes Relative: 5 %
Neutro Abs: 7.2 10*3/uL (ref 1.7–7.7)
Neutrophils Relative %: 66 %
Platelets: 322 10*3/uL (ref 150–400)
RBC: 4.61 MIL/uL (ref 3.87–5.11)
RDW: 13.5 % (ref 11.5–15.5)
WBC: 10.9 10*3/uL — ABNORMAL HIGH (ref 4.0–10.5)
nRBC: 0 % (ref 0.0–0.2)

## 2020-04-05 LAB — URINALYSIS, ROUTINE W REFLEX MICROSCOPIC
Bilirubin Urine: NEGATIVE
Glucose, UA: NEGATIVE mg/dL
Ketones, ur: NEGATIVE mg/dL
Leukocytes,Ua: NEGATIVE
Nitrite: NEGATIVE
Protein, ur: NEGATIVE mg/dL
RBC / HPF: >50 RBC/hpf — ABNORMAL HIGH (ref 0–5)
Specific Gravity, Urine: 1.011 (ref 1.005–1.030)
pH: 5 (ref 5.0–8.0)

## 2020-04-05 LAB — I-STAT BETA HCG BLOOD, ED (MC, WL, AP ONLY): I-stat hCG, quantitative: <5 m[IU]/mL (ref <5)

## 2020-04-05 LAB — WET PREP, GENITAL
Clue Cells Wet Prep HPF POC: NONE SEEN
Sperm: NONE SEEN
Trich, Wet Prep: NONE SEEN
WBC, Wet Prep HPF POC: NONE SEEN
Yeast Wet Prep HPF POC: NONE SEEN

## 2020-04-05 NOTE — ED Notes (Signed)
Pt to u/s

## 2020-04-05 NOTE — ED Notes (Signed)
Patient BIB [facility] for evaluation of lower abdominal pain and unusually heavy menstrual bleeding for the last several days. Patient reports having abnormal periods after COVID vaccination.

## 2020-04-05 NOTE — ED Provider Notes (Signed)
[facility]   CSN: [id number] Arrival date & time: 04/05/20  1043     History Chief Complaint  Patient presents with  . Vaginal Bleeding    Cindy Dixon is a 30 y.o. female presents to the ED for evaluation of vaginal bleeding.  Patient reports that ever since her Covid vaccine in November her menstrual cycles have been off.  She did not get her period for 3 months following her third Covid vaccine in November.  She finally got her period last month and this was normal.  She started her current menstrual cycle on Saturday as expected.  Early Monday morning around 4 AM she woke up with a sudden, severe contraction-like pain in the left ovary.  She felt like something was coming out of her vagina.  This lasted approximately 2 hours.  She had nausea with it but no vomiting.  The pain eventually resolved.  This morning at around 8 AM she had similar pain on the right side with nausea.  She took ibuprofen and called 911.  On route to ER the pain had resolved.  Currently is pain-free.  States her current menses and vaginal bleeding flow/volume is as expected.  No fevers, chills.  No abdominal pain.  No flank pain.  No dysuria.  History of 2 cesareans and bilateral tubal ligation, cholecystectomy.  She is in a monogamous relationship with her female partner and is not concerned about any STDs.  She is wondering if her changes in menses are related to the Covid vaccine or her weight.  HPI     Past Medical History:  Diagnosis Date  . Anemia   . Gastritis   . GERD (gastroesophageal reflux disease)   . Peptic ulcer disease     Patient Active Problem List   Diagnosis Date Noted  . Migraines 04/02/2020  . Prediabetes 10/12/2019  . Vitamin D deficiency 10/12/2019  . Abdominal pain, epigastric 08/03/2019  . Hematemesis 04/20/2019  . Encounter for other preprocedural examination 04/20/2019  . Morbid obesity (HCC) 11/12/2018  . Bilateral  impacted cerumen 11/12/2018  . Gastroesophageal reflux disease 11/12/2018    Past Surgical History:  Procedure Laterality Date  . CESAREAN SECTION     x2  . CHOLECYSTECTOMY    . TUBAL LIGATION       OB History    Gravida  2   Para  2   Term      Preterm      AB      Living        SAB      IAB      Ectopic      Multiple      Live Births              Family History  Problem Relation Age of Onset  . Hypertension Mother   . Hypertension Father   . Arrhythmia Father   . Heart disease Father   . High Cholesterol Father   . Diabetes Maternal Grandfather   . Diabetes Paternal Grandfather   . Colon cancer Neg Hx   . Esophageal cancer Neg Hx   . Rectal cancer Neg Hx   . Stomach cancer Neg Hx     Social History   Tobacco Use  . Smoking status: Never Smoker  . Smokeless tobacco: Never Used  Vaping Use  . Vaping Use: Never used  Substance Use Topics  . Alcohol use: Yes    Comment:  occasionally  . Drug use: No    Home Medications Prior to Admission medications   Medication Sig Start Date End Date Taking? Authorizing Provider  buPROPion (WELLBUTRIN SR) 150 MG 12 hr tablet Take 1 tablet (150 mg total) by mouth daily. 03/27/20   Roswell Nickel, DO  dexlansoprazole (DEXILANT) 60 MG capsule Take 1 capsule (60 mg total) by mouth daily. 01/10/20   Corinna Capra A, DO  metFORMIN (GLUCOPHAGE) 1000 MG tablet Take 1 tablet (1,000 mg total) by mouth 2 (two) times daily with a meal. 03/27/20   Corinna Capra A, DO  sertraline (ZOLOFT) 50 MG tablet Take 1 tablet (50 mg total) by mouth daily. 02/23/20   Corinna Capra A, DO  SUMAtriptan (IMITREX) 50 MG tablet Take 1 tablet (50 mg total) by mouth as needed for migraine. May repeat in 2 hours if headache persists or recurs. Do not exceed 2 tablets in 24 hours. 02/23/20   Roswell Nickel, DO  topiramate (TOPAMAX) 50 MG tablet Take with her evening meal 03/27/20   Corinna Capra A, DO  Vitamin D, Ergocalciferol, (DRISDOL) 1.25 MG  (50000 UNIT) CAPS capsule Take 1 capsule (50,000 Units total) by mouth every 7 (seven) days. 03/27/20   Roswell Nickel, DO    Allergies    Patient has no known allergies.  Review of Systems   Review of Systems  Gastrointestinal: Positive for nausea.  Genitourinary: Positive for pelvic pain and vaginal bleeding.  All other systems reviewed and are negative.   Physical Exam Updated Vital Signs BP 118/88   Pulse 83   Temp 98 F (36.7 C)   Resp 18   LMP 04/01/2020 (Exact Date)   SpO2 98%   Physical Exam Vitals and nursing note reviewed. Exam conducted with a chaperone present.  Constitutional:      Appearance: She is well-developed.     Comments: Non toxic in NAD  HENT:     Head: Normocephalic and atraumatic.     Nose: Nose normal.  Eyes:     Conjunctiva/sclera: Conjunctivae normal.  Cardiovascular:     Rate and Rhythm: Normal rate and regular rhythm.  Pulmonary:     Effort: Pulmonary effort is normal.     Breath sounds: Normal breath sounds.  Abdominal:     General: Bowel sounds are normal.     Palpations: Abdomen is soft.     Tenderness: There is no abdominal tenderness.     Comments: No G/R/R. No suprapubic or CVA tenderness. Negative Murphy's and McBurney's. Active BS to lower quadrants.   Genitourinary:    Vagina: Bleeding present.     Comments:  Exam performed with EMT at bedside for assistance. External genitalia without lesions.  No groin lymphadenopathy.  Vaginal mucosa and cervix pink without lesions.  Scant dark blood in vaginal vault and coming through cervical os. Cervical os visualized and multiparous, closed. No CMT.  Nonpalpable, nontender adnexa.  Perianal skin normal without lesions. Musculoskeletal:        General: Normal range of motion.     Cervical back: Normal range of motion.  Skin:    General: Skin is warm and dry.     Capillary Refill: Capillary refill takes less than 2 seconds.  Neurological:     Mental Status: She is alert.  Psychiatric:         Behavior: Behavior normal.     ED Results / Procedures / Treatments   Labs (all labs ordered are listed, but only abnormal results are  displayed) Labs Reviewed  COMPREHENSIVE METABOLIC PANEL - Abnormal; Notable for the following components:      Result Value   Glucose, Bld 115 (*)    All other components within normal limits  CBC WITH DIFFERENTIAL/PLATELET - Abnormal; Notable for the following components:   WBC 10.9 (*)    All other components within normal limits  URINALYSIS, ROUTINE W REFLEX MICROSCOPIC - Abnormal; Notable for the following components:   APPearance HAZY (*)    Hgb urine dipstick LARGE (*)    RBC / HPF >50 (*)    Bacteria, UA RARE (*)    All other components within normal limits  WET PREP, GENITAL  I-STAT BETA HCG BLOOD, ED ([facility], [facility], [facility] ONLY)  GC/CHLAMYDIA PROBE AMP ([place]) NOT AT [facility]  WET PREP  (BD AFFIRM) ([place])    EKG None  Radiology US Transvaginal Non-OB  Result Date: 04/05/2020 CLINICAL DATA:  30 year old female with pelvic pain. EXAM: TRANSABDOMINAL AND TRANSVAGINAL ULTRASOUND OF PELVIS DOPPLER ULTRASOUND OF OVARIES TECHNIQUE: Both transabdominal and transvaginal ultrasound examinations of the pelvis were performed. Transabdominal technique was performed for global imaging of the pelvis including uterus, ovaries, adnexal regions, and pelvic cul-de-sac. It was necessary to proceed with endovaginal exam following the transabdominal exam to visualize the endometrium and ovaries. Color and duplex Doppler ultrasound was utilized to evaluate blood flow to the ovaries. COMPARISON:  None. FINDINGS: Uterus Measurements: 8.2 x 3.8 x 4.8 cm = volume: 79 mL. No fibroids or other mass visualized. Endometrium Thickness: 11.  No focal abnormality visualized. Right ovary Measurements: 3.4 x 1.7 x 2.5 cm = volume: 8 mL. Normal appearance/no adnexal mass. Left ovary The left ovary is not visualized. Pulsed Doppler evaluation of the right ovary demonstrates  normal low-resistance arterial and venous waveforms. Other findings Poorly visualized mildly dilated anechoic tubular structure in the region of the right adnexa may represent a mildly dilated fallopian tube. This is suboptimally visualized and not well evaluated. IMPRESSION: 1. Unremarkable uterus, endometrium, and the right ovary. 2. Nonvisualization of the left ovary. 3. Poorly visualized mildly dilated tubular structure in the region of the right adnexa. Electronically Signed   By: Elgie Collard M.D.   On: 04/05/2020 18:22   US Pelvis Complete  Result Date: 04/05/2020 CLINICAL DATA:  30 year old female with pelvic pain. EXAM: TRANSABDOMINAL AND TRANSVAGINAL ULTRASOUND OF PELVIS DOPPLER ULTRASOUND OF OVARIES TECHNIQUE: Both transabdominal and transvaginal ultrasound examinations of the pelvis were performed. Transabdominal technique was performed for global imaging of the pelvis including uterus, ovaries, adnexal regions, and pelvic cul-de-sac. It was necessary to proceed with endovaginal exam following the transabdominal exam to visualize the endometrium and ovaries. Color and duplex Doppler ultrasound was utilized to evaluate blood flow to the ovaries. COMPARISON:  None. FINDINGS: Uterus Measurements: 8.2 x 3.8 x 4.8 cm = volume: 79 mL. No fibroids or other mass visualized. Endometrium Thickness: 11.  No focal abnormality visualized. Right ovary Measurements: 3.4 x 1.7 x 2.5 cm = volume: 8 mL. Normal appearance/no adnexal mass. Left ovary The left ovary is not visualized. Pulsed Doppler evaluation of the right ovary demonstrates normal low-resistance arterial and venous waveforms. Other findings Poorly visualized mildly dilated anechoic tubular structure in the region of the right adnexa may represent a mildly dilated fallopian tube. This is suboptimally visualized and not well evaluated. IMPRESSION: 1. Unremarkable uterus, endometrium, and the right ovary. 2. Nonvisualization of the left ovary. 3.  Poorly visualized mildly dilated tubular structure in the region of the right  adnexa. Electronically Signed   By: Elgie Collard M.D.   On: 04/05/2020 18:22   [place] Art/Ven Flow Abd Pelv Doppler  Result Date: 04/05/2020 CLINICAL DATA:  30 year old female with pelvic pain. EXAM: TRANSABDOMINAL AND TRANSVAGINAL ULTRASOUND OF PELVIS DOPPLER ULTRASOUND OF OVARIES TECHNIQUE: Both transabdominal and transvaginal ultrasound examinations of the pelvis were performed. Transabdominal technique was performed for global imaging of the pelvis including uterus, ovaries, adnexal regions, and pelvic cul-de-sac. It was necessary to proceed with endovaginal exam following the transabdominal exam to visualize the endometrium and ovaries. Color and duplex Doppler ultrasound was utilized to evaluate blood flow to the ovaries. COMPARISON:  None. FINDINGS: Uterus Measurements: 8.2 x 3.8 x 4.8 cm = volume: 79 mL. No fibroids or other mass visualized. Endometrium Thickness: 11.  No focal abnormality visualized. Right ovary Measurements: 3.4 x 1.7 x 2.5 cm = volume: 8 mL. Normal appearance/no adnexal mass. Left ovary The left ovary is not visualized. Pulsed Doppler evaluation of the right ovary demonstrates normal low-resistance arterial and venous waveforms. Other findings Poorly visualized mildly dilated anechoic tubular structure in the region of the right adnexa may represent a mildly dilated fallopian tube. This is suboptimally visualized and not well evaluated. IMPRESSION: 1. Unremarkable uterus, endometrium, and the right ovary. 2. Nonvisualization of the left ovary. 3. Poorly visualized mildly dilated tubular structure in the region of the right adnexa. Electronically Signed   By: Elgie Collard M.D.   On: 04/05/2020 18:22    Procedures Procedures   Medications Ordered in ED Medications - No data to display  ED Course  I have reviewed the triage vital signs and the nursing notes.  Pertinent labs & imaging results  that were available during my care of the patient were reviewed by me and considered in my medical decision making (see chart for details).  Clinical Course as of 04/05/20 1901  Wed Apr 05, 2020  1830 Hgb urine dipstick(!): LARGE [CG]  1830 RBC / HPF(!): >50 Patient currently having vaginal bleeding/menses [CG]  1830 WBC(!): 10.9 [CG]  1831 US Pelvis Complete IMPRESSION: 1. Unremarkable uterus, endometrium, and the right ovary 2. Nonvisualization of the left ovary. 3. Poorly visualized mildly dilated tubular structure in the region of the right adnexa. [CG]  [id number] Patient re-evaluated. Discussed results. Remains pain free. Appropriate for discharge [CG]    Clinical Course User Index [CG] Liberty Handy, PA-C   MDM Rules/Calculators/A&P                           30 y.o. year old female presents with transient, now resolved left and right "ovary pain". Currently menstruating. Had missed menses for 3 months before this. History of caesareans, bilateral tubal ligations.   EMR, triage and nursing notes reviewed  Labs and imaging ordered by me as above  Personally visualized and interpreted above labs and imaging   Labs reveal - hcg negative. WBC 10.9. normal Hgb. UA with RBCs in setting of current menses. No history of kidney stones. TVUS reviewed with EDP essentially unremarkable/normal.  No torsion. No large cysts.   Patient re-evaluated and remains pain free. Given resolution of pain, no history of kidney stone and benign exam feel GU source like ureteral stone is unlikely. UA without infection. No CMT or high risk for STD, doubt PID. Considered diverticulitis, appendicitis, SBO as well but very unlikely. Will discharge with monitoring of symptoms. May be menstrual cramps.  Follow up with PCP as needed. Return  precautions given. Patient in agreement with ER treatment and discharge plan.   Final Clinical Impression(s) / ED Diagnoses Final diagnoses:  Pelvic pain    Rx / DC  Orders ED Discharge Orders    None       Liberty Handy, PA-C 04/05/20 1901    Charlynne Pander, MD 04/05/20 2101

## 2020-04-05 NOTE — ED Triage Notes (Signed)
Patient complains of vaginal bleeding that started on Saturday. Patient reports new pain on Monday night in her ovaries that resolved with ibuprofen.

## 2020-04-05 NOTE — Discharge Instructions (Signed)
You were seen in the ER for pelvic pain  Labs and imaging were normal  I think your pain is from menstrual cramps  Take ibuprofen or acetaminophen as needed  Follow up with primary care doctor in 7-10 days as needed  Return for severe, constant or localized pain, burning with urination, flank pain, fevers

## 2020-04-06 ENCOUNTER — Telehealth: Payer: Self-pay

## 2020-04-06 LAB — GC/CHLAMYDIA PROBE AMP (~~LOC~~) NOT AT ARMC
Chlamydia: NEGATIVE
Comment: NEGATIVE
Comment: NORMAL
Neisseria Gonorrhea: NEGATIVE

## 2020-04-06 NOTE — Telephone Encounter (Signed)
Transition Care Management Follow-up Telephone Call  Date of discharge and from where: 04/05/2020 from [facility]  How have you been since you were released from the hospital? Pt stated that she is doing well.   Any questions or concerns? No  Items Reviewed:  Did the pt receive and understand the discharge instructions provided? Yes   Medications obtained and verified? Yes   Other? No   Any new allergies since your discharge? No   Dietary orders reviewed? n/a  Do you have support at home? Yes   Functional Questionnaire: (I = Independent and D = Dependent) ADLs: I  Bathing/Dressing- I  Meal Prep- I  Eating- I  Maintaining continence- I  Transferring/Ambulation- I  Managing Meds- I  Follow up appointments reviewed:   PCP Hospital f/u appt confirmed? No  Pt informed to call PCP. Pt stated understanding and will call for an appointment.   Are transportation arrangements needed? No   If their condition worsens, is the pt aware to call PCP or go to the Emergency Dept.? Yes  Was the patient provided with contact information for the PCP's office or ED? Yes  Was to pt encouraged to call back with questions or concerns? Yes

## 2020-04-17 ENCOUNTER — Encounter (INDEPENDENT_AMBULATORY_CARE_PROVIDER_SITE_OTHER): Payer: Self-pay | Admitting: Bariatrics

## 2020-04-17 ENCOUNTER — Ambulatory Visit (INDEPENDENT_AMBULATORY_CARE_PROVIDER_SITE_OTHER): Payer: Medicaid Other | Admitting: Bariatrics

## 2020-04-17 ENCOUNTER — Other Ambulatory Visit: Payer: Self-pay

## 2020-04-17 VITALS — BP 120/83 | HR 75 | Temp 98.1°F | Ht 68.0 in | Wt 308.0 lb

## 2020-04-17 DIAGNOSIS — E559 Vitamin D deficiency, unspecified: Secondary | ICD-10-CM | POA: Diagnosis not present

## 2020-04-17 DIAGNOSIS — R7303 Prediabetes: Secondary | ICD-10-CM

## 2020-04-17 DIAGNOSIS — Z6841 Body Mass Index (BMI) 40.0 and over, adult: Secondary | ICD-10-CM | POA: Diagnosis not present

## 2020-04-17 DIAGNOSIS — F509 Eating disorder, unspecified: Secondary | ICD-10-CM

## 2020-04-17 DIAGNOSIS — F3289 Other specified depressive episodes: Secondary | ICD-10-CM

## 2020-04-17 MED ORDER — VITAMIN D (ERGOCALCIFEROL) 1.25 MG (50000 UNIT) PO CAPS: 50000.0000 [IU] | ORAL_CAPSULE | ORAL | 0 refills | Status: DC

## 2020-04-17 MED ORDER — TOPIRAMATE 50 MG PO TABS: ORAL_TABLET | ORAL | 0 refills | Status: DC

## 2020-04-17 MED ORDER — METFORMIN HCL 1000 MG PO TABS: 1000.0000 mg | ORAL_TABLET | Freq: Two times a day (BID) | ORAL | 0 refills | Status: DC

## 2020-04-18 ENCOUNTER — Encounter (INDEPENDENT_AMBULATORY_CARE_PROVIDER_SITE_OTHER): Payer: Self-pay | Admitting: Bariatrics

## 2020-04-18 LAB — HEMOGLOBIN A1C
Est. average glucose Bld gHb Est-mCnc: 120 mg/dL
Hgb A1c MFr Bld: 5.8 % — ABNORMAL HIGH (ref 4.8–5.6)

## 2020-04-18 LAB — VITAMIN D 25 HYDROXY (VIT D DEFICIENCY, FRACTURES): Vit D, 25-Hydroxy: 25.3 ng/mL — ABNORMAL LOW (ref 30.0–100.0)

## 2020-04-18 LAB — INSULIN, RANDOM: INSULIN: 25.8 u[IU]/mL — ABNORMAL HIGH (ref 2.6–24.9)

## 2020-04-18 NOTE — Progress Notes (Signed)
Chief Complaint:   OBESITY Cindy Dixon is here to discuss her progress with her obesity treatment plan along with follow-up of her obesity related diagnoses. Cindy Dixon is on the Category 3 Plan and states she is following her eating plan approximately 40% of the time. Cindy Dixon states she is walking for 30 minutes 3 times per week.  Today's visit was #: 12 Starting weight: 321 lbs Starting date: 10/11/2019 Today's weight: 308 lbs Today's date: 04/17/2020 Total lbs lost to date: 13 lbs Total lbs lost since last in-office visit: 2 lbs  Interim History: Cindy Dixon is down 2 pounds since her last visit.  She states that it was easier.  Subjective:   1. Vitamin D deficiency Cindy Dixon's Vitamin D level was 18.6 on 10/11/2019. She is currently taking prescription vitamin D 50,000 IU each week. She denies nausea, vomiting or muscle weakness.  She gets minimal sun exposure.  2. Prediabetes Cindy Dixon has a diagnosis of prediabetes based on her elevated HgA1c and was informed this puts her at greater risk of developing diabetes. She continues to work on diet and exercise to decrease her risk of diabetes. She denies nausea or hypoglycemia.  She is taking metformin.  Lab Results  Component Value Date   HGBA1C 6.0 (H) 10/11/2019   Lab Results  Component Value Date   INSULIN 34.1 (H) 10/11/2019   3. Eating disorder, unspecified type Cindy Dixon is taking Topamax 50 mg with her evening meal.  Assessment/Plan:   1. Vitamin D deficiency Low Vitamin D level contributes to fatigue and are associated with obesity, breast, and colon cancer. She agrees to continue to take prescription Vitamin D @50 ,000 IU every week.  Will check vitamin D level today.    - Refill Vitamin D, Ergocalciferol, (DRISDOL) 1.25 MG (50000 UNIT) CAPS capsule; Take 1 capsule (50,000 Units total) by mouth every 7 (seven) days.  Dispense: 4 capsule; Refill: 0 - VITAMIN D 25 Hydroxy (Vit-D Deficiency, Fractures)  2.  Prediabetes Cindy Dixon will continue to work on weight loss, exercise, and decreasing simple carbohydrates to help decrease the risk of diabetes.  Continue metformin.  Refilled today.  Will check A1c and insulin level today.  - Refill metFORMIN (GLUCOPHAGE) 1000 MG tablet; Take 1 tablet (1,000 mg total) by mouth 2 (two) times daily with a meal.  Dispense: 60 tablet; Refill: 0 - Hemoglobin A1c - Insulin, random  3. Eating disorder, unspecified type Continue Topamax, as per below.  - Refill topiramate (TOPAMAX) 50 MG tablet; Take with her evening meal  Dispense: 30 tablet; Refill: 0  4. Class 3 severe obesity with serious comorbidity and body mass index (BMI) of 45.0 to 49.9 in adult, unspecified obesity type (HCC)  Cindy Dixon is currently in the action stage of change. As such, her goal is to continue with weight loss efforts. She has agreed to the Category 3 Plan.   She will work on meal planning, decreasing carbohydrates and sweets, and continuing to walk.  Labs from 04/05/2020, including CMP, CBC, and glucose, were reviewed today.  Exercise goals: Will continue to walk.  Behavioral modification strategies: increasing lean protein intake, decreasing simple carbohydrates, increasing vegetables, increasing water intake, decreasing eating out, no skipping meals, meal planning and cooking strategies, keeping healthy foods in the home and planning for success.  Cindy Dixon has agreed to follow-up with our clinic in 2-3 weeks. She was informed of the importance of frequent follow-up visits to maximize her success with intensive lifestyle modifications for her multiple health conditions.   Objective:  Blood pressure 120/83, pulse 75, temperature 98.1 F (36.7 C), height 5\' 8"  (1.727 m), weight (!) 308 lb (139.7 kg), last menstrual period 04/01/2020, SpO2 97 %. Body mass index is 46.83 kg/m.  General: Cooperative, alert, well developed, in no acute distress. HEENT: Conjunctivae and lids  unremarkable. Cardiovascular: Regular rhythm.  Lungs: Normal work of breathing. Neurologic: No focal deficits.   Lab Results  Component Value Date   CREATININE 0.64 04/05/2020   BUN 9 04/05/2020   NA 138 04/05/2020   K 3.7 04/05/2020   CL 104 04/05/2020   CO2 23 04/05/2020   Lab Results  Component Value Date   ALT 22 04/05/2020   AST 21 04/05/2020   ALKPHOS 58 04/05/2020   BILITOT 0.4 04/05/2020   Lab Results  Component Value Date   HGBA1C 6.0 (H) 10/11/2019   HGBA1C 5.7 (H) 11/12/2018   Lab Results  Component Value Date   INSULIN 34.1 (H) 10/11/2019   Lab Results  Component Value Date   TSH 2.540 10/11/2019   Lab Results  Component Value Date   CHOL 172 10/11/2019   HDL 36 (L) 10/11/2019   LDLCALC 108 (H) 10/11/2019   TRIG 155 (H) 10/11/2019   CHOLHDL 4.0 11/12/2018   Lab Results  Component Value Date   WBC 10.9 (H) 04/05/2020   HGB 13.0 04/05/2020   HCT 39.1 04/05/2020   MCV 84.8 04/05/2020   PLT 322 04/05/2020   Attestation Statements:   Reviewed by clinician on day of visit: allergies, medications, problem list, medical history, surgical history, family history, social history, and previous encounter notes.  I, 04/07/2020, CMA, am acting as Insurance claims handler for Energy manager, DO  I have reviewed the above documentation for accuracy and completeness, and I agree with the above. [employer], DO

## 2020-05-04 ENCOUNTER — Encounter: Payer: Self-pay | Admitting: Nurse Practitioner

## 2020-05-04 ENCOUNTER — Ambulatory Visit: Payer: Medicaid Other | Admitting: Nurse Practitioner

## 2020-05-04 VITALS — BP 124/70 | HR 77 | Ht 68.0 in | Wt 315.4 lb

## 2020-05-04 DIAGNOSIS — K219 Gastro-esophageal reflux disease without esophagitis: Secondary | ICD-10-CM

## 2020-05-04 NOTE — Progress Notes (Signed)
05/04/2020 Cindy Dixon Cindy Dixon [id number] 07-03-1990   Chief Complaint: acid reflux   History of Present Illness: Cindy Dixon is a 30 year old female with a past medical history of morbid obesity and GERD.  Past cholecystectomy secondary to gallstones approximately 8 years ago while living in [place] I last saw her in the office on 09/15/2019 for further evaluation regarding reflux symptoms. At that time, she was advised to continue Dexilant 60mg  QD and to take gaviscon 1 tbsp tidi PRN. A fasting gastrin level was ordered but was not done. She was referred to [facility]. She presents today accompanied by a [facility] Spanish interpreter for further follow up. She continues to have nonstop heartburn and she has stomach burning every morning. She wishes to proceed with a 24 hr ph impedence study as previously recommended by Dr. [facility]. No NSAID use. No weight loss. She has gained 5lbs over the past 4 weeks. She ran out of Dexilant about 2 weeks ago. She stated her heartburn is not worse since she stopped taking Dexilant. She is passing a normal BM daily. No rectal bleeding or black stools.   EGD 04/22/2019: - No gross lesions in esophagus. Biopsied. - 1 cm hiatal hernia. - No gross lesions in the stomach. Biopsied. - No gross lesions in the duodenal bulb, in the first portion of the duodenum and in the second portion of the duodenum. Biopsied.  1. Surgical [P], random duodenal sites - BENIGN SMALL BOWEL MUCOSA. - NO ACTIVE INFLAMMATION OR VILLOUS ATROPHY IDENTIFIED. 2. Surgical [P], random gastric sites - CHRONIC INACTIVE GASTRITIS, MILD. - THERE IS NO EVIDENCE OF HELICOBACTER PYLORI, DYSPLASIA, OR MALIGNANCY. - SEE COMMENT. 3. Surgical [P], random esophageal sites - MINIMALLY INFLAMED SQUAMOUS MUCSOA. - THERE IS NO EVIDENCE OF SIGNIFICANT INCREASE IN EOSINOPHILS, DYSPLASIA, OR MALIGNANCY.  Current Outpatient Medications on  File Prior to Visit  Medication Sig Dispense Refill  . buPROPion (WELLBUTRIN SR) 150 MG 12 hr tablet Take 1 tablet (150 mg total) by mouth daily. 30 tablet 0  . dexlansoprazole (DEXILANT) 60 MG capsule Take 1 capsule (60 mg total) by mouth daily. 30 capsule 5  . metFORMIN (GLUCOPHAGE) 1000 MG tablet Take 1 tablet (1,000 mg total) by mouth 2 (two) times daily with a meal. 60 tablet 0  . sertraline (ZOLOFT) 50 MG tablet Take 1 tablet (50 mg total) by mouth daily. 30 tablet 0  . SUMAtriptan (IMITREX) 50 MG tablet Take 1 tablet (50 mg total) by mouth as needed for migraine. May repeat in 2 hours if headache persists or recurs. Do not exceed 2 tablets in 24 hours. 10 tablet 0  . topiramate (TOPAMAX) 50 MG tablet Take with her evening meal 30 tablet 0  . Vitamin D, Ergocalciferol, (DRISDOL) 1.25 MG (50000 UNIT) CAPS capsule Take 1 capsule (50,000 Units total) by mouth every 7 (seven) days. 4 capsule 0   No current facility-administered medications on file prior to visit.   No Known Allergies  Current Medications, Allergies, Past Medical History, Past Surgical History, Family History and Social History were reviewed in 06/22/2019 record.  Review of Systems:   Constitutional: Negative for fever, sweats, chills or weight loss.  Respiratory: Negative for shortness of breath.   Cardiovascular: Negative for chest pain, palpitations and leg swelling.  Gastrointestinal: See HPI.  Musculoskeletal: Negative for back pain or muscle aches.  Neurological: Negative for dizziness, headaches or paresthesias.  Physical Exam: BP 124/70   Pulse 77   Ht 5\' 8"  (1.727 m)   Wt (!) 315 lb 6.4 oz (143.1 kg)   BMI 47.96 kg/m   Wt Readings from Last 3 Encounters:  05/04/20 (!) 315 lb 6.4 oz (143.1 kg)  04/17/20 (!) 308 lb (139.7 kg)  03/27/20 (!) 310 lb (140.6 kg)   General: Obese 30 year old female in no acute distress. Head: Normocephalic and atraumatic. Eyes: No scleral icterus.  Conjunctiva pink . Ears: Normal auditory acuity. Lungs: Clear throughout to auscultation. Heart: Regular rate and rhythm, no murmur. Abdomen: Soft, nontender and nondistended. No masses or hepatomegaly. Normal bowel sounds x 4 quadrants.  Rectal: Deferred.  Musculoskeletal: Symmetrical with no gross deformities. Extremities: No edema. Neurological: Alert oriented x 4. No focal deficits.  Psychological: Alert and cooperative. Normal mood and affect  Assessment and Recommendations:  1. GERD, persistent heartburn symptoms on and off Dexilant.  EGD 04/22/2019 without evidence of reflux. Small hiatal hernia.  -24 hour ph impendence study to rule out hypersensitivity esophagus -Remain off Dexilant for now -Gaviscon 1 tbsp as needed -Schedule esophageal manometry if ph study unrevealing   2. Morbid obesity -Encouraged continue follow up with weight wellness management center

## 2020-05-04 NOTE — Patient Instructions (Addendum)
If you are age 32 or younger, your body mass index should be between 19-25. Your Body mass index is 47.96 kg/m. If this is out of the aformentioned range listed, please consider follow up with your Primary Care Provider.   You have been scheduled for an esophageal manometry test at [facility] on 05/17/20 at 12:30pm. Please arrive 30 minutes prior to your procedure for registration. You will need to go to outpatient registration (1st floor of the hospital) first. Make certain to bring your insurance cards as well as a complete list of medications.  You must have a Covid test prior to your appointment. You have been scheduled for a Covid test on 05/11/20 at 9:15am This is a Drive Up Visit at [address] [place], [place] [address] Someone will direct you to the appropriate testing line. Stay in your car and someone will be with you shortly.  Please remember the following:  1) Do not take any muscle relaxants, xanax (alprazolam) or ativan for 1 day prior to your test as well as the day of the test.  2) Nothing to eat or drink after 12:00 midnight on the night before your test.  3) Hold all diabetic medications/insulin the morning of the test. You may eat and take your medications after the test.  It will take at least 2 weeks to receive the results of this test from your physician.  ------------------------------------------ ABOUT ESOPHAGEAL MANOMETRY Esophageal manometry (muh-NOM-uh-tree) is a test that gauges how well your esophagus works. Your esophagus is the long, muscular tube that connects your throat to your stomach. Esophageal manometry measures the rhythmic muscle contractions (peristalsis) that occur in your esophagus when you swallow. Esophageal manometry also measures the coordination and force exerted by the muscles of your esophagus.  During esophageal manometry, a thin, flexible tube (catheter) that contains sensors is passed through your nose, down your esophagus and  into your stomach. Esophageal manometry can be helpful in diagnosing some mostly uncommon disorders that affect your esophagus.  Why it's done Esophageal manometry is used to evaluate the movement (motility) of food through the esophagus and into the stomach. The test measures how well the circular bands of muscle (sphincters) at the top and bottom of your esophagus open and close, as well as the pressure, strength and pattern of the wave of esophageal muscle contractions that moves food along.  What you can expect Esophageal manometry is an outpatient procedure done without sedation. Most people tolerate it well. You may be asked to change into a hospital gown before the test starts.  During esophageal manometry  . While you are sitting up, a member of your health care team sprays your throat with a numbing medication or puts numbing gel in your nose or both.  . A catheter is guided through your nose into your esophagus. The catheter may be sheathed in a water-filled sleeve. It doesn't interfere with your breathing. However, your eyes may water, and you may gag. You may have a slight nosebleed from irritation.  . After the catheter is in place, you may be asked to lie on your back on an exam table, or you may be asked to remain seated.  . You then swallow small sips of water. As you do, a computer connected to the catheter records the pressure, strength and pattern of your esophageal muscle contractions.  . During the test, you'll be asked to breathe slowly and smoothly, remain as still as possible, and swallow only when you're  asked to do so.  . A member of your health care team may move the catheter down into your stomach while the catheter continues its measurements.  . The catheter then is slowly withdrawn. The test usually lasts 20 to 30 minutes.  After esophageal manometry  When your esophageal manometry is complete, you may return to your normal activities  This test typically takes 30-45  minutes to complete. ________________________________________________________________________________  LABS:  Lab work has been ordered for you today. Our lab is located in the basement. Press "B" on the elevator. The lab is located at the first door on the left as you exit the elevator.  HEALTHCARE LAWS AND MY CHART RESULTS: Due to recent changes in healthcare laws, you may see the results of your imaging and laboratory studies on MyChart before your provider has had a chance to review them.   We understand that in some cases there may be results that are confusing or concerning to you. Not all laboratory results come back in the same time frame and the provider may be waiting for multiple results in order to interpret others.  Please give [place] 48 hours in order for your provider to thoroughly review all the results before contacting the office for clarification of your results.   Hold Dexilant until after the test is done.  You may take Gaviscon 1 tablespoon 3 times a day for heartburn and stomach burning but stop taking it 2 days before the test.  It was great seeing you today! Thank you for entrusting me with your care and choosing [facility].  Arnaldo Natal, CRNP

## 2020-05-05 NOTE — Progress Notes (Signed)
Attending Physician's Attestation   I have reviewed the chart.   I agree with the Advanced Practitioner's note, impression, and recommendations with any updates as below.    Gabriel Mansouraty, MD [facility] Office # [phone]  

## 2020-05-08 ENCOUNTER — Encounter (INDEPENDENT_AMBULATORY_CARE_PROVIDER_SITE_OTHER): Payer: Self-pay | Admitting: Bariatrics

## 2020-05-08 ENCOUNTER — Ambulatory Visit (INDEPENDENT_AMBULATORY_CARE_PROVIDER_SITE_OTHER): Payer: Medicaid Other | Admitting: Bariatrics

## 2020-05-08 ENCOUNTER — Other Ambulatory Visit: Payer: Self-pay

## 2020-05-08 VITALS — BP 127/85 | HR 86 | Temp 97.5°F | Ht 68.0 in | Wt 311.0 lb

## 2020-05-08 DIAGNOSIS — R7303 Prediabetes: Secondary | ICD-10-CM

## 2020-05-08 DIAGNOSIS — F3289 Other specified depressive episodes: Secondary | ICD-10-CM

## 2020-05-08 DIAGNOSIS — Z6841 Body Mass Index (BMI) 40.0 and over, adult: Secondary | ICD-10-CM | POA: Diagnosis not present

## 2020-05-08 DIAGNOSIS — G43809 Other migraine, not intractable, without status migrainosus: Secondary | ICD-10-CM | POA: Diagnosis not present

## 2020-05-08 DIAGNOSIS — K219 Gastro-esophageal reflux disease without esophagitis: Secondary | ICD-10-CM

## 2020-05-08 MED ORDER — BUPROPION HCL ER (SR) 150 MG PO TB12: 150.0000 mg | ORAL_TABLET | Freq: Every day | ORAL | 0 refills | Status: DC

## 2020-05-08 MED ORDER — METFORMIN HCL 1000 MG PO TABS
1000.0000 mg | ORAL_TABLET | Freq: Two times a day (BID) | ORAL | 0 refills | Status: DC
Start: 2020-05-08 — End: 2020-05-29

## 2020-05-08 MED ORDER — SERTRALINE HCL 50 MG PO TABS: 50.0000 mg | ORAL_TABLET | Freq: Every day | ORAL | 0 refills | Status: DC

## 2020-05-08 MED ORDER — SUMATRIPTAN SUCCINATE 50 MG PO TABS: 50.0000 mg | ORAL_TABLET | ORAL | 0 refills | Status: DC | PRN

## 2020-05-08 MED ORDER — DEXLANSOPRAZOLE 60 MG PO CPDR: 60.0000 mg | DELAYED_RELEASE_CAPSULE | Freq: Every day | ORAL | 5 refills | Status: DC

## 2020-05-08 MED ORDER — TOPIRAMATE 50 MG PO TABS: ORAL_TABLET | ORAL | 0 refills | Status: DC

## 2020-05-09 ENCOUNTER — Encounter (INDEPENDENT_AMBULATORY_CARE_PROVIDER_SITE_OTHER): Payer: Self-pay | Admitting: Bariatrics

## 2020-05-09 NOTE — Progress Notes (Signed)
Chief Complaint:   OBESITY Cindy Dixon is here to discuss her progress with her obesity treatment plan along with follow-up of her obesity related diagnoses. Cindy Dixon is on the Category 3 Plan and states she is following her eating plan approximately 40% of the time. Cindy Dixon states she is not exercising regularly.  Today's visit was #: 13 Starting weight: 321 lbs Starting date:  10/11/2019  Today's weight: 311 lbs Today's date: 05/08/2020 Total lbs lost to date: 10 lbs Total lbs lost since last in-office visit: 0  Interim History: Cindy Dixon is up 3 pounds since her last visit.  Subjective:   1. Gastroesophageal reflux disease, unspecified whether esophagitis present Cindy Dixon is taking Dexilant.    2. Prediabetes Cindy Dixon has a diagnosis of prediabetes based on her elevated HgA1c and was informed this puts her at greater risk of developing diabetes. She continues to work on diet and exercise to decrease her risk of diabetes. She denies nausea or hypoglycemia.  She endorses polyphagia.  A1c is down from 6.0 to 5.8.  Insulin 25.8.  Taking metformin.  Lab Results  Component Value Date   HGBA1C 5.8 (H) 04/17/2020   Lab Results  Component Value Date   INSULIN 25.8 (H) 04/17/2020   INSULIN 34.1 (H) 10/11/2019   3. Other migraine without status migrainosus, not intractable She has an appointment on May 9th with Neurology.  4. Other depression, with emotional eating She has had increased stress and anxiety.  Assessment/Plan:   1. Gastroesophageal reflux disease, unspecified whether esophagitis present Intensive lifestyle modifications are the first line treatment for this issue. We discussed several lifestyle modifications today and she will continue to work on diet, exercise and weight loss efforts. Orders and follow up as documented in patient record.  Continue medication.  Counseling . If a person has gastroesophageal reflux disease (GERD), food and stomach acid  move back up into the esophagus and cause symptoms or problems such as damage to the esophagus. . Anti-reflux measures include: raising the head of the bed, avoiding tight clothing or belts, avoiding eating late at night, not lying down shortly after mealtime, and achieving weight loss. . Avoid ASA, NSAID's, caffeine, alcohol, and tobacco.  . OTC Pepcid and/or Tums are often very helpful for as needed use.  Marland Kitchen However, for persisting chronic or daily symptoms, stronger medications like Omeprazole may be needed. . You may need to avoid foods and drinks such as: ? Coffee and tea (with or without caffeine). ? Drinks that contain alcohol. ? Energy drinks and sports drinks. ? Bubbly (carbonated) drinks or sodas. ? Chocolate and cocoa. ? Peppermint and mint flavorings. ? Garlic and onions. ? Horseradish. ? Spicy and acidic foods. These include peppers, chili powder, curry powder, vinegar, hot sauces, and BBQ sauce. ? Citrus fruit juices and citrus fruits, such as oranges, lemons, and limes. ? Tomato-based foods. These include red sauce, chili, salsa, and pizza with red sauce. ? Fried and fatty foods. These include donuts, french fries, potato chips, and high-fat dressings. ? High-fat meats. These include hot dogs, rib eye steak, sausage, ham, and bacon.  - Refill dexlansoprazole (DEXILANT) 60 MG capsule; Take 1 capsule (60 mg total) by mouth daily.  Dispense: 30 capsule; Refill: 5  2. Prediabetes Cindy Dixon will continue to work on weight loss, exercise, and decreasing simple carbohydrates to help decrease the risk of diabetes.  Continue metformin.  - Refill metFORMIN (GLUCOPHAGE) 1000 MG tablet; Take 1 tablet (1,000 mg total) by mouth 2 (two) times daily  with a meal.  Dispense: 60 tablet; Refill: 0  3. Other migraine without status migrainosus, not intractable Will refill Imitrex today, as per below.  Follow-up with Neurology as scheduled.  - Refill SUMAtriptan (IMITREX) 50 MG tablet; Take 1  tablet (50 mg total) by mouth as needed for migraine. May repeat in 2 hours if headache persists or recurs. Do not exceed 2 tablets in 24 hours.  Dispense: 10 tablet; Refill: 0  4. Other depression, with emotional eating Will refill Wellbutrin, Zoloft, and Topamax, as per below.  Will place referral to counselor or Psychiatry for increased anxiety and stress.  - Refill buPROPion (WELLBUTRIN SR) 150 MG 12 hr tablet; Take 1 tablet (150 mg total) by mouth daily.  Dispense: 30 tablet; Refill: 0 - Refill sertraline (ZOLOFT) 50 MG tablet; Take 1 tablet (50 mg total) by mouth daily.  Dispense: 30 tablet; Refill: 0 - Refill topiramate (TOPAMAX) 50 MG tablet; Take with her evening meal  Dispense: 30 tablet; Refill: 0 - Ambulatory referral to Psychiatry  5. Class 3 severe obesity with serious comorbidity and body mass index (BMI) of 45.0 to 49.9 in adult, unspecified obesity type (HCC)  Cindy Dixon is currently in the action stage of change. As such, her goal is to continue with weight loss efforts. She has agreed to the Category 3 Plan.   She will work on adhering strictly to the plan and eliminating all sugary drinks.  Labs, including vitamin D, A1c, and insulin, were discussed.  Exercise goals: For substantial health benefits, adults should do at least 150 minutes (2 hours and 30 minutes) a week of moderate-intensity, or 75 minutes (1 hour and 15 minutes) a week of vigorous-intensity aerobic physical activity, or an equivalent combination of moderate- and vigorous-intensity aerobic activity. Aerobic activity should be performed in episodes of at least 10 minutes, and preferably, it should be spread throughout the week.  Behavioral modification strategies: increasing lean protein intake, decreasing simple carbohydrates, increasing vegetables, increasing water intake, decreasing eating out, no skipping meals, meal planning and cooking strategies, keeping healthy foods in the home and planning for  success.  Cindy Dixon has agreed to follow-up with our clinic in 2-3 weeks. She was informed of the importance of frequent follow-up visits to maximize her success with intensive lifestyle modifications for her multiple health conditions.   Objective:   Pulse 86, temperature (!) 97.5 F (36.4 C), height 5\' 8"  (1.727 m), weight (!) 311 lb (141.1 kg), SpO2 95 %. Body mass index is 47.29 kg/m.  General: Cooperative, alert, well developed, in no acute distress. HEENT: Conjunctivae and lids unremarkable. Cardiovascular: Regular rhythm.  Lungs: Normal work of breathing. Neurologic: No focal deficits.   Lab Results  Component Value Date   CREATININE 0.64 04/05/2020   BUN 9 04/05/2020   NA 138 04/05/2020   K 3.7 04/05/2020   CL 104 04/05/2020   CO2 23 04/05/2020   Lab Results  Component Value Date   ALT 22 04/05/2020   AST 21 04/05/2020   ALKPHOS 58 04/05/2020   BILITOT 0.4 04/05/2020   Lab Results  Component Value Date   HGBA1C 5.8 (H) 04/17/2020   HGBA1C 6.0 (H) 10/11/2019   HGBA1C 5.7 (H) 11/12/2018   Lab Results  Component Value Date   INSULIN 25.8 (H) 04/17/2020   INSULIN 34.1 (H) 10/11/2019   Lab Results  Component Value Date   TSH 2.540 10/11/2019   Lab Results  Component Value Date   CHOL 172 10/11/2019   HDL 36 (  L) 10/11/2019   LDLCALC 108 (H) 10/11/2019   TRIG 155 (H) 10/11/2019   CHOLHDL 4.0 11/12/2018   Lab Results  Component Value Date   WBC 10.9 (H) 04/05/2020   HGB 13.0 04/05/2020   HCT 39.1 04/05/2020   MCV 84.8 04/05/2020   PLT 322 04/05/2020   Attestation Statements:   Reviewed by clinician on day of visit: allergies, medications, problem list, medical history, surgical history, family history, social history, and previous encounter notes.  I, Insurance claims handler, CMA, am acting as Energy manager for [employer], DO  I have reviewed the above documentation for accuracy and completeness, and I agree with the above. Corinna Capra, DO

## 2020-05-10 ENCOUNTER — Telehealth: Payer: Self-pay | Admitting: Gastroenterology

## 2020-05-10 NOTE — Telephone Encounter (Signed)
Pt last saw Colleen please send accordingly. 

## 2020-05-10 NOTE — Telephone Encounter (Signed)
Called patient via interpreter: [name] [phone]. Let her know she needs to come into our lab this week for fasting blood work. Also answered questions she had about the COVID testing on 05/11/20  and procedure on 05/17/20

## 2020-05-10 NOTE — Telephone Encounter (Signed)
Pt is requesting a call back from a nurse to discuss which labs she needs to have done prior to her procedure.  Pt is spanish speaking

## 2020-05-11 ENCOUNTER — Other Ambulatory Visit (HOSPITAL_COMMUNITY)
Admission: RE | Admit: 2020-05-11 | Discharge: 2020-05-11 | Disposition: A | Discharge status: Home / Self Care | Payer: Medicaid Other | Source: Ambulatory Visit | Attending: Gastroenterology | Admitting: Gastroenterology

## 2020-05-11 DIAGNOSIS — Z20822 Contact with and (suspected) exposure to covid-19: Secondary | ICD-10-CM | POA: Diagnosis not present

## 2020-05-11 DIAGNOSIS — Z01812 Encounter for preprocedural laboratory examination: Secondary | ICD-10-CM | POA: Diagnosis present

## 2020-05-11 LAB — SARS CORONAVIRUS 2 (TAT 6-24 HRS): SARS Coronavirus 2: NEGATIVE

## 2020-05-12 ENCOUNTER — Other Ambulatory Visit: Payer: Medicaid Other

## 2020-05-12 DIAGNOSIS — K219 Gastro-esophageal reflux disease without esophagitis: Secondary | ICD-10-CM

## 2020-05-17 ENCOUNTER — Encounter (HOSPITAL_COMMUNITY)
Admission: RE | Disposition: A | Discharge status: Home / Self Care | Payer: Self-pay | Source: Home / Self Care | Attending: Gastroenterology

## 2020-05-17 ENCOUNTER — Ambulatory Visit (HOSPITAL_COMMUNITY)
Admission: RE | Admit: 2020-05-17 | Discharge: 2020-05-17 | Disposition: A | Discharge status: Home / Self Care | Payer: Medicaid Other | Attending: Gastroenterology | Admitting: Gastroenterology

## 2020-05-17 DIAGNOSIS — K219 Gastro-esophageal reflux disease without esophagitis: Secondary | ICD-10-CM

## 2020-05-17 DIAGNOSIS — R12 Heartburn: Secondary | ICD-10-CM | POA: Insufficient documentation

## 2020-05-17 HISTORY — PX: 24 HOUR PH STUDY: SHX5419

## 2020-05-17 HISTORY — PX: ESOPHAGEAL MANOMETRY: SHX5429

## 2020-05-17 LAB — GASTRIN: Gastrin: 17 pg/mL (ref <=100)

## 2020-05-17 SURGERY — MANOMETRY, ESOPHAGUS
Anesthesia: LOCAL

## 2020-05-17 MED ORDER — LIDOCAINE VISCOUS HCL 2 % MT SOLN
OROMUCOSAL | Status: AC
  Filled 2020-05-17: qty 15

## 2020-05-17 SURGICAL SUPPLY — 2 items
FACESHIELD LNG OPTICON STERILE (SAFETY) IMPLANT
GLOVE BIO SURGEON STRL SZ8 (GLOVE) ×4 IMPLANT

## 2020-05-17 NOTE — Progress Notes (Signed)
Esophageal manometry performed per protocol.  Patient tolerated procedure without any diffuculties.  PH probe then placed at 34 cm at left nare. Spanish version of written education provided. Spanish translator Edie assisted with verbal education  on use of the  equipment and when to return to Endoscopy unit to have probe removed .  Patient verbalized understanding of all instructions.  Report to be sent to Dr. Marsa Aris.

## 2020-05-18 ENCOUNTER — Encounter (HOSPITAL_COMMUNITY): Payer: Self-pay | Admitting: Gastroenterology

## 2020-05-29 ENCOUNTER — Encounter (INDEPENDENT_AMBULATORY_CARE_PROVIDER_SITE_OTHER): Payer: Self-pay | Admitting: Bariatrics

## 2020-05-29 ENCOUNTER — Ambulatory Visit (INDEPENDENT_AMBULATORY_CARE_PROVIDER_SITE_OTHER): Payer: Medicaid Other | Admitting: Bariatrics

## 2020-05-29 ENCOUNTER — Other Ambulatory Visit: Payer: Self-pay

## 2020-05-29 VITALS — BP 127/86 | HR 79 | Temp 98.1°F | Ht 68.0 in | Wt 308.0 lb

## 2020-05-29 DIAGNOSIS — R7303 Prediabetes: Secondary | ICD-10-CM

## 2020-05-29 DIAGNOSIS — Z6841 Body Mass Index (BMI) 40.0 and over, adult: Secondary | ICD-10-CM | POA: Diagnosis not present

## 2020-05-29 DIAGNOSIS — E559 Vitamin D deficiency, unspecified: Secondary | ICD-10-CM

## 2020-05-29 DIAGNOSIS — G43809 Other migraine, not intractable, without status migrainosus: Secondary | ICD-10-CM

## 2020-05-29 DIAGNOSIS — F3289 Other specified depressive episodes: Secondary | ICD-10-CM

## 2020-05-29 DIAGNOSIS — K219 Gastro-esophageal reflux disease without esophagitis: Secondary | ICD-10-CM | POA: Diagnosis not present

## 2020-05-29 MED ORDER — DEXLANSOPRAZOLE 60 MG PO CPDR: 60.0000 mg | DELAYED_RELEASE_CAPSULE | Freq: Every day | ORAL | 5 refills | Status: DC

## 2020-05-29 MED ORDER — METFORMIN HCL 1000 MG PO TABS: 1000.0000 mg | ORAL_TABLET | Freq: Two times a day (BID) | ORAL | 0 refills | Status: DC

## 2020-05-29 MED ORDER — SUMATRIPTAN SUCCINATE 50 MG PO TABS
50.0000 mg | ORAL_TABLET | ORAL | 0 refills | Status: DC | PRN
Start: 2020-05-29 — End: 2020-06-28

## 2020-05-29 MED ORDER — VITAMIN D (ERGOCALCIFEROL) 1.25 MG (50000 UNIT) PO CAPS: 50000.0000 [IU] | ORAL_CAPSULE | ORAL | 0 refills | Status: DC

## 2020-05-29 MED ORDER — TOPIRAMATE 50 MG PO TABS: ORAL_TABLET | ORAL | 0 refills | Status: DC

## 2020-05-29 MED ORDER — SERTRALINE HCL 50 MG PO TABS: 50.0000 mg | ORAL_TABLET | Freq: Every day | ORAL | 0 refills | Status: DC

## 2020-05-30 ENCOUNTER — Encounter (INDEPENDENT_AMBULATORY_CARE_PROVIDER_SITE_OTHER): Payer: Self-pay | Admitting: Bariatrics

## 2020-05-30 NOTE — Progress Notes (Signed)
Chief Complaint:   OBESITY Cindy Dixon is here to discuss her progress with her obesity treatment plan along with follow-up of her obesity related diagnoses. Cindy Dixon is on the Category 3 Plan and states she is following her eating plan approximately 40% of the time. Cindy Dixon states she is walking for 30 minutes 1 time per week.  Today's visit was #: 14 Starting weight: 321 lbs Starting date: 10/11/2019 Today's weight: 308 lbs Today's date: 05/29/2020 Total lbs lost to date: 13 lbs Total lbs lost since last in-office visit: 3 lbs  Interim History: Cindy Dixon is down another 3 pounds since her last visit.  She is exercising less than previously.  She is monitoring portions.  Subjective:   1. Gastroesophageal reflux disease, unspecified whether esophagitis present Improved.  Controlled with medication.  2. Vitamin D deficiency Cindy Dixon's Vitamin D level was 25.3 on 04/17/2020. She is currently taking prescription vitamin D 50,000 IU each week. She denies nausea, vomiting or muscle weakness.  3. Prediabetes Cindy Dixon has a diagnosis of prediabetes based on her elevated HgA1c and was informed this puts her at greater risk of developing diabetes. She continues to work on diet and exercise to decrease her risk of diabetes. She denies nausea or hypoglycemia.  Taking metformin.   Lab Results  Component Value Date   HGBA1C 5.8 (H) 04/17/2020   Lab Results  Component Value Date   INSULIN 25.8 (H) 04/17/2020   INSULIN 34.1 (H) 10/11/2019   4. Other migraine without status migrainosus, not intractable Improved.  Taking Imitrex as needed.  5. Other depression, with emotional eating Controlled.  Assessment/Plan:   1. Gastroesophageal reflux disease, unspecified whether esophagitis present Intensive lifestyle modifications are the first line treatment for this issue. We discussed several lifestyle modifications today and she will continue to work on diet, exercise and weight  loss efforts.  Refill Dexilant, as per below.  Counseling . If a person has gastroesophageal reflux disease (GERD), food and stomach acid move back up into the esophagus and cause symptoms or problems such as damage to the esophagus. . Anti-reflux measures include: raising the head of the bed, avoiding tight clothing or belts, avoiding eating late at night, not lying down shortly after mealtime, and achieving weight loss. . Avoid ASA, NSAID's, caffeine, alcohol, and tobacco.  . OTC Pepcid and/or Tums are often very helpful for as needed use.  Marland Kitchen However, for persisting chronic or daily symptoms, stronger medications like Omeprazole may be needed. . You may need to avoid foods and drinks such as: ? Coffee and tea (with or without caffeine). ? Drinks that contain alcohol. ? Energy drinks and sports drinks. ? Bubbly (carbonated) drinks or sodas. ? Chocolate and cocoa. ? Peppermint and mint flavorings. ? Garlic and onions. ? Horseradish. ? Spicy and acidic foods. These include peppers, chili powder, curry powder, vinegar, hot sauces, and BBQ sauce. ? Citrus fruit juices and citrus fruits, such as oranges, lemons, and limes. ? Tomato-based foods. These include red sauce, chili, salsa, and pizza with red sauce. ? Fried and fatty foods. These include donuts, french fries, potato chips, and high-fat dressings. ? High-fat meats. These include hot dogs, rib eye steak, sausage, ham, and bacon.  - Refill dexlansoprazole (DEXILANT) 60 MG capsule; Take 1 capsule (60 mg total) by mouth daily.  Dispense: 30 capsule; Refill: 5  2. Vitamin D deficiency Low Vitamin D level contributes to fatigue and are associated with obesity, breast, and colon cancer. She agrees to continue to take prescription  Vitamin D @50 ,000 IU every week and will follow-up for routine testing of Vitamin D, at least 2-3 times per year to avoid over-replacement.  - Refill Vitamin D, Ergocalciferol, (DRISDOL) 1.25 MG (50000 UNIT) CAPS  capsule; Take 1 capsule (50,000 Units total) by mouth every 7 (seven) days.  Dispense: 4 capsule; Refill: 0  3. Prediabetes Cindy Dixon will continue to work on weight loss, exercise, and decreasing simple carbohydrates to help decrease the risk of diabetes.  Continue metformin.  - Refill metFORMIN (GLUCOPHAGE) 1000 MG tablet; Take 1 tablet (1,000 mg total) by mouth 2 (two) times daily with a meal.  Dispense: 60 tablet; Refill: 0  4. Other migraine without status migrainosus, not intractable Continue Imitrex as needed.  Will refill today, as per below.  She has an appointment with Neurology on 06/19/2020.  - Refill SUMAtriptan (IMITREX) 50 MG tablet; Take 1 tablet (50 mg total) by mouth as needed for migraine. May repeat in 2 hours if headache persists or recurs. Do not exceed 2 tablets in 24 hours.  Dispense: 10 tablet; Refill: 0  5. Other depression, with emotional eating Continue Zoloft and Topamax, as per below.  She has an appointment with a counselor on 06/15/2020.  - Refill sertraline (ZOLOFT) 50 MG tablet; Take 1 tablet (50 mg total) by mouth daily.  Dispense: 30 tablet; Refill: 0 - Refill topiramate (TOPAMAX) 50 MG tablet; Take with her evening meal  Dispense: 30 tablet; Refill: 0  6. Obesity, current BMI 46  Cindy Dixon is currently in the action stage of change. As such, her goal is to continue with weight loss efforts. She has agreed to the Category 3 Plan.   She will work on adhering closely to the plan and meal planning.  Exercise goals: For substantial health benefits, adults should do at least 150 minutes (2 hours and 30 minutes) a week of moderate-intensity, or 75 minutes (1 hour and 15 minutes) a week of vigorous-intensity aerobic physical activity, or an equivalent combination of moderate- and vigorous-intensity aerobic activity. Aerobic activity should be performed in episodes of at least 10 minutes, and preferably, it should be spread throughout the week.  Behavioral  modification strategies: increasing lean protein intake, decreasing simple carbohydrates, increasing vegetables, increasing water intake, decreasing eating out, no skipping meals, meal planning and cooking strategies, keeping healthy foods in the home and planning for success.  Cindy Dixon has agreed to follow-up with our clinic in 3-4 weeks. She was informed of the importance of frequent follow-up visits to maximize her success with intensive lifestyle modifications for her multiple health conditions.   Objective:   Blood pressure 127/86, pulse 79, temperature 98.1 F (36.7 C), height 5\' 8"  (1.727 m), weight (!) 308 lb (139.7 kg), SpO2 93 %. Body mass index is 46.83 kg/m.  General: Cooperative, alert, well developed, in no acute distress. HEENT: Conjunctivae and lids unremarkable. Cardiovascular: Regular rhythm.  Lungs: Normal work of breathing. Neurologic: No focal deficits.   Lab Results  Component Value Date   CREATININE 0.64 04/05/2020   BUN 9 04/05/2020   NA 138 04/05/2020   K 3.7 04/05/2020   CL 104 04/05/2020   CO2 23 04/05/2020   Lab Results  Component Value Date   ALT 22 04/05/2020   AST 21 04/05/2020   ALKPHOS 58 04/05/2020   BILITOT 0.4 04/05/2020   Lab Results  Component Value Date   HGBA1C 5.8 (H) 04/17/2020   HGBA1C 6.0 (H) 10/11/2019   HGBA1C 5.7 (H) 11/12/2018   Lab Results  Component Value Date   INSULIN 25.8 (H) 04/17/2020   INSULIN 34.1 (H) 10/11/2019   Lab Results  Component Value Date   TSH 2.540 10/11/2019   Lab Results  Component Value Date   CHOL 172 10/11/2019   HDL 36 (L) 10/11/2019   LDLCALC 108 (H) 10/11/2019   TRIG 155 (H) 10/11/2019   CHOLHDL 4.0 11/12/2018   Lab Results  Component Value Date   WBC 10.9 (H) 04/05/2020   HGB 13.0 04/05/2020   HCT 39.1 04/05/2020   MCV 84.8 04/05/2020   PLT 322 04/05/2020   Attestation Statements:   Reviewed by clinician on day of visit: allergies, medications, problem list, medical  history, surgical history, family history, social history, and previous encounter notes.  I, Insurance claims handler, CMA, am acting as Energy manager for [employer], DO  I have reviewed the above documentation for accuracy and completeness, and I agree with the above. Corinna Capra, DO

## 2020-06-15 ENCOUNTER — Ambulatory Visit: Payer: Medicaid Other | Admitting: Nurse Practitioner

## 2020-06-19 ENCOUNTER — Ambulatory Visit: Payer: Medicaid Other | Admitting: Neurology

## 2020-06-19 ENCOUNTER — Telehealth: Payer: Self-pay | Admitting: Neurology

## 2020-06-19 ENCOUNTER — Encounter: Payer: Self-pay | Admitting: Neurology

## 2020-06-19 NOTE — Telephone Encounter (Signed)
Patient no-showed new patient appointment in neurology.  If patient calls back she can be rescheduled with any physician however please inform her that we have many patients waiting for appointments and if she no-shows again or cancels she may be dismissed from our office per office policy.

## 2020-06-28 ENCOUNTER — Other Ambulatory Visit (INDEPENDENT_AMBULATORY_CARE_PROVIDER_SITE_OTHER): Payer: Self-pay | Admitting: Bariatrics

## 2020-06-28 ENCOUNTER — Encounter (INDEPENDENT_AMBULATORY_CARE_PROVIDER_SITE_OTHER): Payer: Self-pay | Admitting: Bariatrics

## 2020-06-28 ENCOUNTER — Ambulatory Visit (INDEPENDENT_AMBULATORY_CARE_PROVIDER_SITE_OTHER): Payer: Medicaid Other | Admitting: Bariatrics

## 2020-06-28 ENCOUNTER — Other Ambulatory Visit: Payer: Self-pay

## 2020-06-28 ENCOUNTER — Other Ambulatory Visit: Payer: Self-pay | Admitting: Gastroenterology

## 2020-06-28 VITALS — BP 139/80 | HR 68 | Temp 97.9°F | Ht 68.0 in | Wt 312.0 lb

## 2020-06-28 DIAGNOSIS — F3289 Other specified depressive episodes: Secondary | ICD-10-CM | POA: Diagnosis not present

## 2020-06-28 DIAGNOSIS — K219 Gastro-esophageal reflux disease without esophagitis: Secondary | ICD-10-CM | POA: Diagnosis not present

## 2020-06-28 DIAGNOSIS — G43809 Other migraine, not intractable, without status migrainosus: Secondary | ICD-10-CM

## 2020-06-28 DIAGNOSIS — R7303 Prediabetes: Secondary | ICD-10-CM

## 2020-06-28 DIAGNOSIS — Z6841 Body Mass Index (BMI) 40.0 and over, adult: Secondary | ICD-10-CM | POA: Diagnosis not present

## 2020-06-28 DIAGNOSIS — E559 Vitamin D deficiency, unspecified: Secondary | ICD-10-CM | POA: Diagnosis not present

## 2020-06-28 MED ORDER — METFORMIN HCL 1000 MG PO TABS: 1000.0000 mg | ORAL_TABLET | Freq: Two times a day (BID) | ORAL | 0 refills | Status: DC

## 2020-06-28 MED ORDER — PANTOPRAZOLE SODIUM 40 MG PO TBEC
40.0000 mg | DELAYED_RELEASE_TABLET | Freq: Every day | ORAL | 0 refills | Status: DC
Start: 2020-06-28 — End: 2020-06-28

## 2020-06-28 MED ORDER — INSULIN PEN NEEDLE 31G X 6 MM MISC: 0 refills | Status: DC

## 2020-06-28 MED ORDER — SERTRALINE HCL 50 MG PO TABS: 50.0000 mg | ORAL_TABLET | Freq: Every day | ORAL | 0 refills | Status: DC

## 2020-06-28 MED ORDER — BUPROPION HCL ER (SR) 150 MG PO TB12: 150.0000 mg | ORAL_TABLET | Freq: Every day | ORAL | 0 refills | Status: DC

## 2020-06-28 MED ORDER — TOPIRAMATE 50 MG PO TABS
ORAL_TABLET | ORAL | 0 refills | Status: DC
Start: 2020-06-28 — End: 2020-06-28

## 2020-06-28 MED ORDER — VITAMIN D (ERGOCALCIFEROL) 1.25 MG (50000 UNIT) PO CAPS: 50000.0000 [IU] | ORAL_CAPSULE | ORAL | 0 refills | Status: DC

## 2020-06-28 MED ORDER — AMITRIPTYLINE HCL 25 MG PO TABS: 25.0000 mg | ORAL_TABLET | Freq: Every day | ORAL | 3 refills | Status: DC

## 2020-06-28 MED ORDER — VICTOZA 18 MG/3ML ~~LOC~~ SOPN: PEN_INJECTOR | SUBCUTANEOUS | 0 refills | Status: DC

## 2020-06-28 MED ORDER — PANTOPRAZOLE SODIUM 40 MG PO TBEC: 40.0000 mg | DELAYED_RELEASE_TABLET | Freq: Every day | ORAL | 3 refills | Status: DC

## 2020-06-28 MED ORDER — SUMATRIPTAN SUCCINATE 50 MG PO TABS: 50.0000 mg | ORAL_TABLET | ORAL | 0 refills | Status: DC | PRN

## 2020-06-28 NOTE — Telephone Encounter (Signed)
Please see possible drug interaction. Please advise.

## 2020-06-28 NOTE — Telephone Encounter (Signed)
Awaiting further information from Pharmacy on potential use of Nortryptiline or Trazodone.

## 2020-06-28 NOTE — Telephone Encounter (Signed)
Thank you for the update and for pharmacy colleagues on seeing this potential drug interaction. We could consider use of trazodone or nortriptyline.  I suspect both of them also have this particular issue though our pharmacy team would know better. Can you find out from pharmacy if nortriptyline or trazodone also have the same potential reaction?  Trazodone prescription would be 12.5 mg nightly while nortriptyline would be 10 mg nightly. Thanks.  But hear back from pharmacy and decide where to go from there.  If drug interactions are present for all the medications, I would let her be seen in clinic to discuss with her in depth before starting any medication.  Let her know what you find out.  Thanks. GM

## 2020-06-29 NOTE — Progress Notes (Signed)
Chief Complaint:   OBESITY Cindy Dixon is here to discuss her progress with her obesity treatment plan along with follow-up of her obesity related diagnoses. Cindy Dixon is on the Category 3 Plan and states she is following her eating plan approximately 30% of the time. Cindy Dixon states she walked a lot in [place] on vacation.  Today's visit was #: 15 Starting weight: 321 lbs Starting date: 10/11/2019 Today's weight: 312 lbs Today's date: 06/28/2020 Total lbs lost to date: 9 Total lbs lost since last in-office visit: 0  Interim History: Cindy Dixon is up 4 lbs since her last visit.  Subjective:   1. Other depression, with emotional eating Cindy Dixon is taking amitriptyline, Wellbutrin, and Zoloft..  2. Other migraine without status migrainosus, not intractable Cindy Dixon is taking Imitrex.   3. Vitamin D deficiency Cindy Dixon denies nausea, vomiting, and muscle weakness.  4. Pre-diabetes Cindy Dixon has a diagnosis of prediabetes based on her elevated HgA1c and was informed this puts her at greater risk of developing diabetes. She continues to work on diet and exercise to decrease her risk of diabetes. She denies nausea or hypoglycemia.  Lab Results  Component Value Date   HGBA1C 5.8 (H) 04/17/2020   Lab Results  Component Value Date   INSULIN 25.8 (H) 04/17/2020   INSULIN 34.1 (H) 10/11/2019    5. Gastroesophageal reflux disease, unspecified whether esophagitis present Cindy Dixon was taking Dexilant, but insurance will not cover, and will need to change to a covered medication.   Assessment/Plan:   1. Other depression, with emotional eating Behavior modification techniques were discussed today to help Cindy Dixon deal with her emotional/non-hunger eating behaviors.  Orders and follow up as documented in patient record.   - buPROPion (WELLBUTRIN SR) 150 MG 12 hr tablet; Take 1 tablet (150 mg total) by mouth daily.  Dispense: 30 tablet; Refill: 0 - sertraline (ZOLOFT)  50 MG tablet; Take 1 tablet (50 mg total) by mouth daily.  Dispense: 30 tablet; Refill: 0  2. Other migraine without status migrainosus, not intractable Continue current treatment plan.  - SUMAtriptan (IMITREX) 50 MG tablet; Take 1 tablet (50 mg total) by mouth as needed for migraine. May repeat in 2 hours if headache persists or recurs. Do not exceed 2 tablets in 24 hours.  Dispense: 10 tablet; Refill: 0  3. Vitamin D deficiency Low Vitamin D level contributes to fatigue and are associated with obesity, breast, and colon cancer. She agrees to continue to take prescription Vitamin D @50 ,000 IU every week and will follow-up for routine testing of Vitamin D, at least 2-3 times per year to avoid over-replacement.  - Vitamin D, Ergocalciferol, (DRISDOL) 1.25 MG (50000 UNIT) CAPS capsule; Take 1 capsule (50,000 Units total) by mouth every 7 (seven) days.  Dispense: 4 capsule; Refill: 0  4. Pre-diabetes Cindy Dixon will continue to work on weight loss, exercise, and decreasing simple carbohydrates to help decrease the risk of diabetes.   - metFORMIN (GLUCOPHAGE) 1000 MG tablet; Take 1 tablet (1,000 mg total) by mouth 2 (two) times daily with a meal.  Dispense: 60 tablet; Refill: 0 - liraglutide (VICTOZA) 18 MG/3ML SOPN; Start 0.6mg  SQ once a day for 7 days, then increase to 1.2mg  once a day  Dispense: 6 mL; Refill: 0  5. Gastroesophageal reflux disease, unspecified whether esophagitis present Intensive lifestyle modifications are the first line treatment for this issue. We discussed several lifestyle modifications today and she will continue to work on diet, exercise and weight loss efforts. Orders and follow up as  documented in patient record.   Counseling . If a person has gastroesophageal reflux disease (GERD), food and stomach acid move back up into the esophagus and cause symptoms or problems such as damage to the esophagus. . Anti-reflux measures include: raising the head of the bed, avoiding  tight clothing or belts, avoiding eating late at night, not lying down shortly after mealtime, and achieving weight loss. . Avoid ASA, NSAID's, caffeine, alcohol, and tobacco.  . OTC Pepcid and/or Tums are often very helpful for as needed use.  Marland Kitchen However, for persisting chronic or daily symptoms, stronger medications like Omeprazole may be needed. . You may need to avoid foods and drinks such as: ? Coffee and tea (with or without caffeine). ? Drinks that contain alcohol. ? Energy drinks and sports drinks. ? Bubbly (carbonated) drinks or sodas. ? Chocolate and cocoa. ? Peppermint and mint flavorings. ? Garlic and onions. ? Horseradish. ? Spicy and acidic foods. These include peppers, chili powder, curry powder, vinegar, hot sauces, and BBQ sauce. ? Citrus fruit juices and citrus fruits, such as oranges, lemons, and limes. ? Tomato-based foods. These include red sauce, chili, salsa, and pizza with red sauce. ? Fried and fatty foods. These include donuts, french fries, potato chips, and high-fat dressings. ? High-fat meats. These include hot dogs, rib eye steak, sausage, ham, and bacon.  6. Obesity, current BMI 47 Cindy Dixon is currently in the action stage of change. As such, her goal is to continue with weight loss efforts. She has agreed to the Category 3 Plan.   Meal plan Intentional eating  Exercise goals: As is  Behavioral modification strategies: increasing lean protein intake, decreasing simple carbohydrates, increasing vegetables, increasing water intake, decreasing eating out, no skipping meals, meal planning and cooking strategies, keeping healthy foods in the home and planning for success.  Cindy Dixon has agreed to follow-up with our clinic in 2-3 weeks. She was informed of the importance of frequent follow-up visits to maximize her success with intensive lifestyle modifications for her multiple health conditions.   Objective:   Blood pressure 139/80, pulse 68, temperature  97.9 F (36.6 C), height 5\' 8"  (1.727 m), weight (!) 312 lb (141.5 kg), SpO2 95 %. Body mass index is 47.44 kg/m.  General: Cooperative, alert, well developed, in no acute distress. HEENT: Conjunctivae and lids unremarkable. Cardiovascular: Regular rhythm.  Lungs: Normal work of breathing. Neurologic: No focal deficits.   Lab Results  Component Value Date   CREATININE 0.64 04/05/2020   BUN 9 04/05/2020   NA 138 04/05/2020   K 3.7 04/05/2020   CL 104 04/05/2020   CO2 23 04/05/2020   Lab Results  Component Value Date   ALT 22 04/05/2020   AST 21 04/05/2020   ALKPHOS 58 04/05/2020   BILITOT 0.4 04/05/2020   Lab Results  Component Value Date   HGBA1C 5.8 (H) 04/17/2020   HGBA1C 6.0 (H) 10/11/2019   HGBA1C 5.7 (H) 11/12/2018   Lab Results  Component Value Date   INSULIN 25.8 (H) 04/17/2020   INSULIN 34.1 (H) 10/11/2019   Lab Results  Component Value Date   TSH 2.540 10/11/2019   Lab Results  Component Value Date   CHOL 172 10/11/2019   HDL 36 (L) 10/11/2019   LDLCALC 108 (H) 10/11/2019   TRIG 155 (H) 10/11/2019   CHOLHDL 4.0 11/12/2018   Lab Results  Component Value Date   WBC 10.9 (H) 04/05/2020   HGB 13.0 04/05/2020   HCT 39.1 04/05/2020   MCV  84.8 04/05/2020   PLT 322 04/05/2020   No results found for: IRON, TIBC, FERRITIN   Attestation Statements:   Reviewed by clinician on day of visit: allergies, medications, problem list, medical history, surgical history, family history, social history, and previous encounter notes.  Edmund Hilda, CMA, am acting as Energy manager for [employer], DO.  I have reviewed the above documentation for accuracy and completeness, and I agree with the above. Corinna Capra, DO

## 2020-06-29 NOTE — Telephone Encounter (Addendum)
Per pharmacist on duty today- there is NO drug interaction. Ok to fill Amitriptyline.

## 2020-07-02 ENCOUNTER — Other Ambulatory Visit (INDEPENDENT_AMBULATORY_CARE_PROVIDER_SITE_OTHER): Payer: Self-pay | Admitting: Bariatrics

## 2020-07-02 DIAGNOSIS — G43809 Other migraine, not intractable, without status migrainosus: Secondary | ICD-10-CM

## 2020-07-03 ENCOUNTER — Encounter (INDEPENDENT_AMBULATORY_CARE_PROVIDER_SITE_OTHER): Payer: Self-pay | Admitting: Bariatrics

## 2020-07-18 ENCOUNTER — Other Ambulatory Visit: Payer: Self-pay

## 2020-07-18 ENCOUNTER — Ambulatory Visit (INDEPENDENT_AMBULATORY_CARE_PROVIDER_SITE_OTHER): Payer: Medicaid Other | Admitting: Bariatrics

## 2020-07-18 ENCOUNTER — Encounter (INDEPENDENT_AMBULATORY_CARE_PROVIDER_SITE_OTHER): Payer: Self-pay | Admitting: Bariatrics

## 2020-07-18 VITALS — BP 117/73 | HR 67 | Temp 97.9°F | Ht 68.0 in | Wt 312.0 lb

## 2020-07-18 DIAGNOSIS — G43809 Other migraine, not intractable, without status migrainosus: Secondary | ICD-10-CM

## 2020-07-18 DIAGNOSIS — F5089 Other specified eating disorder: Secondary | ICD-10-CM

## 2020-07-18 DIAGNOSIS — F3289 Other specified depressive episodes: Secondary | ICD-10-CM

## 2020-07-18 DIAGNOSIS — E559 Vitamin D deficiency, unspecified: Secondary | ICD-10-CM | POA: Diagnosis not present

## 2020-07-18 DIAGNOSIS — Z6841 Body Mass Index (BMI) 40.0 and over, adult: Secondary | ICD-10-CM

## 2020-07-18 DIAGNOSIS — R7303 Prediabetes: Secondary | ICD-10-CM

## 2020-07-18 MED ORDER — METFORMIN HCL 1000 MG PO TABS: 1000.0000 mg | ORAL_TABLET | Freq: Two times a day (BID) | ORAL | 0 refills | Status: DC

## 2020-07-18 MED ORDER — VITAMIN D (ERGOCALCIFEROL) 1.25 MG (50000 UNIT) PO CAPS: 50000.0000 [IU] | ORAL_CAPSULE | ORAL | 0 refills | Status: DC

## 2020-07-18 MED ORDER — SUMATRIPTAN SUCCINATE 50 MG PO TABS: 50.0000 mg | ORAL_TABLET | ORAL | 0 refills | Status: DC | PRN

## 2020-07-18 MED ORDER — BUPROPION HCL ER (SR) 150 MG PO TB12: 150.0000 mg | ORAL_TABLET | Freq: Every day | ORAL | 0 refills | Status: DC

## 2020-07-18 MED ORDER — SERTRALINE HCL 100 MG PO TABS: 100.0000 mg | ORAL_TABLET | Freq: Every day | ORAL | 3 refills | Status: DC

## 2020-07-19 ENCOUNTER — Telehealth (INDEPENDENT_AMBULATORY_CARE_PROVIDER_SITE_OTHER): Payer: Self-pay

## 2020-07-19 NOTE — Telephone Encounter (Signed)
Per Dr. Theora Gianotti request, pt's pharmacy was contacted to find out if pt was able to have Victoza filled, and pharmacy stated that this was able to be filled. They could not provide rationale as to exactly why the pt was not able to get it filled last month. The pharmacy tech suggested that around the time the pt attempted to get the medication last month, the medication was on short supply, but this issue has since been fixed.  An attempt was made to notify pt of this using the telephone number for [employer].  This nurse spoke with interpreter Lauraine Rinne, Interpreter ID #: [phone], and a call was placed to pt. The pt's phone went directly to voicemail and she did not have her voicemail box set up at this time.

## 2020-07-25 ENCOUNTER — Encounter (INDEPENDENT_AMBULATORY_CARE_PROVIDER_SITE_OTHER): Payer: Self-pay | Admitting: Bariatrics

## 2020-07-25 NOTE — Progress Notes (Signed)
Chief Complaint:   OBESITY Cindy Dixon is here to discuss her progress with her obesity treatment plan along with follow-up of her obesity related diagnoses. Cindy Dixon is on the Category 3 Plan and states she is following her eating plan approximately 20% of the time. Cindy Dixon states she is walking for 30 minutes 3 times per week.  Today's visit was #: 16 Starting weight: 321 lbs Starting date: 10/11/2019 Today's weight: 312 lbs Today's date: 07/18/2020 Total lbs lost to date: 9 Total lbs lost since last in-office visit: 0  Interim History: Cindy Dixon states that she was not able to follow the meal plan closely due to back pain. Her wight remains the same.  Subjective:   1. Pre-diabetes Cindy Dixon notes that metformin helps some with appetite.  2. Vitamin D deficiency Cindy Dixon is taking Vit D as directed.  3. Other migraine without status migrainosus, not intractable Cindy Dixon notes migraines 2-3 times per week.  4. Other disorder of eating Cindy Dixon notes increased appetite and some cravings.  5. Other depression Cindy Dixon notes she is still anxious. She is taking Zoloft 50 mg.  Assessment/Plan:   1. Pre-diabetes Cindy Dixon will continue to work on weight loss, exercise, and decreasing simple carbohydrates to help decrease the risk of diabetes. We will refill metformin for 1 month.  - metFORMIN (GLUCOPHAGE) 1000 MG tablet; Take 1 tablet (1,000 mg total) by mouth 2 (two) times daily with a meal.  Dispense: 60 tablet; Refill: 0  2. Vitamin D deficiency Low Vitamin D level contributes to fatigue and are associated with obesity, breast, and colon cancer. We will refill prescription Vitamin D for 1 month. Cindy Dixon will follow-up for routine testing of Vitamin D, at least 2-3 times per year to avoid over-replacement.  - Vitamin D, Ergocalciferol, (DRISDOL) 1.25 MG (50000 UNIT) CAPS capsule; Take 1 capsule (50,000 Units total) by mouth every 7 (seven) days.   Dispense: 4 capsule; Refill: 0  3. Other migraine without status migrainosus, not intractable Cindy Dixon will continue Imitrex and we will refill for 1 month.   - SUMAtriptan (IMITREX) 50 MG tablet; Take 1 tablet (50 mg total) by mouth as needed for migraine. May repeat in 2 hours if headache persists or recurs. Do not exceed 2 tablets in 24 hours.  Dispense: 10 tablet; Refill: 0  4. Other disorder of eating Behavior modification techniques were discussed today to help Cindy Dixon deal with her emotional/non-hunger eating behaviors. We will refill Wellbutrin SR for 1 month. Orders and follow up as documented in patient record.   - buPROPion (WELLBUTRIN SR) 150 MG 12 hr tablet; Take 1 tablet (150 mg total) by mouth daily.  Dispense: 30 tablet; Refill: 0  5. Other depression Behavior modification techniques were discussed today to help Cindy Dixon deal with her depression. Cindy Dixon agreed to increase Zoloft to 100 mg q daily with no refills. Orders and follow up as documented in patient record.   - sertraline (ZOLOFT) 100 MG tablet; Take 1 tablet (100 mg total) by mouth daily.  Dispense: 30 tablet; Refill: 3  6. Class 3 severe obesity with serious comorbidity and body mass index (BMI) of 45.0 to 49.9 in adult, unspecified obesity type (HCC) Cindy Dixon is currently in the action stage of change. As such, her goal is to continue with weight loss efforts. She has agreed to the Category 3 Plan.   Cindy Dixon will get back to the meal plan, and she will adhere more closely.   Exercise goals: As is.  Behavioral modification strategies: increasing lean  protein intake, decreasing simple carbohydrates, increasing vegetables, increasing water intake, decreasing eating out, no skipping meals, meal planning and cooking strategies, keeping healthy foods in the home, better snacking choices, emotional eating strategies, and planning for success.  Cindy Dixon has agreed to follow-up with our clinic in 2  weeks. She was informed of the importance of frequent follow-up visits to maximize her success with intensive lifestyle modifications for her multiple health conditions.   Objective:   Blood pressure 117/73, pulse 67, temperature 97.9 F (36.6 C), height 5\' 8"  (1.727 m), weight (!) 312 lb (141.5 kg), SpO2 97 %. Body mass index is 47.44 kg/m.  General: Cooperative, alert, well developed, in no acute distress. HEENT: Conjunctivae and lids unremarkable. Cardiovascular: Regular rhythm.  Lungs: Normal work of breathing. Neurologic: No focal deficits.   Lab Results  Component Value Date   CREATININE 0.64 04/05/2020   BUN 9 04/05/2020   NA 138 04/05/2020   K 3.7 04/05/2020   CL 104 04/05/2020   CO2 23 04/05/2020   Lab Results  Component Value Date   ALT 22 04/05/2020   AST 21 04/05/2020   ALKPHOS 58 04/05/2020   BILITOT 0.4 04/05/2020   Lab Results  Component Value Date   HGBA1C 5.8 (H) 04/17/2020   HGBA1C 6.0 (H) 10/11/2019   HGBA1C 5.7 (H) 11/12/2018   Lab Results  Component Value Date   INSULIN 25.8 (H) 04/17/2020   INSULIN 34.1 (H) 10/11/2019   Lab Results  Component Value Date   TSH 2.540 10/11/2019   Lab Results  Component Value Date   CHOL 172 10/11/2019   HDL 36 (L) 10/11/2019   LDLCALC 108 (H) 10/11/2019   TRIG 155 (H) 10/11/2019   CHOLHDL 4.0 11/12/2018   Lab Results  Component Value Date   WBC 10.9 (H) 04/05/2020   HGB 13.0 04/05/2020   HCT 39.1 04/05/2020   MCV 84.8 04/05/2020   PLT 322 04/05/2020   No results found for: IRON, TIBC, FERRITIN  Attestation Statements:   Reviewed by clinician on day of visit: allergies, medications, problem list, medical history, surgical history, family history, social history, and previous encounter notes.   04/07/2020, am acting as Trude Mcburney for Energy manager, DO.  I have reviewed the above documentation for accuracy and completeness, and I agree with the above. [employer], DO

## 2020-08-02 ENCOUNTER — Other Ambulatory Visit: Payer: Self-pay

## 2020-08-02 ENCOUNTER — Encounter (INDEPENDENT_AMBULATORY_CARE_PROVIDER_SITE_OTHER): Payer: Self-pay | Admitting: Bariatrics

## 2020-08-02 ENCOUNTER — Ambulatory Visit (INDEPENDENT_AMBULATORY_CARE_PROVIDER_SITE_OTHER): Payer: Medicaid Other | Admitting: Bariatrics

## 2020-08-02 VITALS — BP 132/83 | HR 86 | Temp 98.3°F | Ht 68.0 in | Wt 313.0 lb

## 2020-08-02 DIAGNOSIS — Z6841 Body Mass Index (BMI) 40.0 and over, adult: Secondary | ICD-10-CM | POA: Diagnosis not present

## 2020-08-02 DIAGNOSIS — F5089 Other specified eating disorder: Secondary | ICD-10-CM

## 2020-08-02 DIAGNOSIS — R632 Polyphagia: Secondary | ICD-10-CM | POA: Diagnosis not present

## 2020-08-02 DIAGNOSIS — E559 Vitamin D deficiency, unspecified: Secondary | ICD-10-CM | POA: Diagnosis not present

## 2020-08-02 MED ORDER — VITAMIN D (ERGOCALCIFEROL) 1.25 MG (50000 UNIT) PO CAPS: 50000.0000 [IU] | ORAL_CAPSULE | ORAL | 0 refills | Status: DC

## 2020-08-02 MED ORDER — BUPROPION HCL ER (SR) 200 MG PO TB12: 200.0000 mg | ORAL_TABLET | Freq: Every day | ORAL | 0 refills | Status: DC

## 2020-08-02 NOTE — Progress Notes (Signed)
wellbutrin ° °

## 2020-08-08 ENCOUNTER — Other Ambulatory Visit: Payer: Self-pay

## 2020-08-08 NOTE — Progress Notes (Signed)
Chief Complaint:   OBESITY Cindy Dixon is here to discuss her progress with her obesity treatment plan along with follow-up of her obesity related diagnoses. Cindy Dixon is on the Category 3 Plan and states she is following her eating plan approximately 40% of the time. Cindy Dixon states she is walking for 30 minutes 2 times per week.  Today's visit was #: 17 Starting weight: 321 lbs Starting date: 10/11/2019 Today's weight: 313 lbs Today's date: 08/02/2020 Total lbs lost to date: 8 Total lbs lost since last in-office visit: 0  Interim History: (Interpreter is Alene Mires). Cindy Dixon is up 1 lb since her last visit.   Subjective:   1. Vitamin D deficiency Cindy Dixon is taking Vit D.  2. Polyphagia Cindy Dixon has a prescription for Victoza.  3. Other disorder of eating Cindy Dixon is taking Wellbutrin SR.  Assessment/Plan:   1. Vitamin D deficiency Low Vitamin D level contributes to fatigue and are associated with obesity, breast, and colon cancer. We will refill prescription Vitamin D for 1 month. Cindy Dixon will follow-up for routine testing of Vitamin D, at least 2-3 times per year to avoid over-replacement.  - Vitamin D, Ergocalciferol, (DRISDOL) 1.25 MG (50000 UNIT) CAPS capsule; Take 1 capsule (50,000 Units total) by mouth every 7 (seven) days.  Dispense: 4 capsule; Refill: 0  2. Polyphagia Intensive lifestyle modifications are the first line treatment for this issue. We discussed several lifestyle modifications today. Cindy Dixon will begin Victoza and hold at 0.6 mg daily. She will continue to work on diet, exercise and weight loss efforts. Orders and follow up as documented in patient record.  Counseling Polyphagia is excessive hunger. Causes can include: low blood sugars, hypERthyroidism, PMS, lack of sleep, stress, insulin resistance, diabetes, certain medications, and diets that are deficient in protein and fiber.   3. Other disorder of eating Behavior  modification techniques were discussed today to help Cindy Dixon deal with her emotional/non-hunger eating behaviors. We will refill Wellbutrin SR for 1 month. Orders and follow up as documented in patient record.   - buPROPion (WELLBUTRIN SR) 200 MG 12 hr tablet; Take 1 tablet (200 mg total) by mouth daily.  Dispense: 30 tablet; Refill: 0  4. Obesity, current BMI 34.4 Cindy Dixon is currently in the action stage of change. As such, her goal is to continue with weight loss efforts. She has agreed to the Category 3 Plan.   Cindy Dixon will adhere more closely with the meal plan.  Exercise goals: As is.  Behavioral modification strategies: increasing lean protein intake, decreasing simple carbohydrates, increasing vegetables, increasing water intake, decreasing eating out, no skipping meals, meal planning and cooking strategies, keeping healthy foods in the home, and planning for success.  Cindy Dixon has agreed to follow-up with our clinic in 3 to 4 weeks. She was informed of the importance of frequent follow-up visits to maximize her success with intensive lifestyle modifications for her multiple health conditions.   Objective:   Blood pressure 132/83, pulse 86, temperature 98.3 F (36.8 C), height 5\' 8"  (1.727 m), weight (!) 313 lb (142 kg), SpO2 97 %. Body mass index is 47.59 kg/m.  General: Cooperative, alert, well developed, in no acute distress. HEENT: Conjunctivae and lids unremarkable. Cardiovascular: Regular rhythm.  Lungs: Normal work of breathing. Neurologic: No focal deficits.   Lab Results  Component Value Date   CREATININE 0.64 04/05/2020   BUN 9 04/05/2020   NA 138 04/05/2020   K 3.7 04/05/2020   CL 104 04/05/2020   CO2 23 04/05/2020  Lab Results  Component Value Date   ALT 22 04/05/2020   AST 21 04/05/2020   ALKPHOS 58 04/05/2020   BILITOT 0.4 04/05/2020   Lab Results  Component Value Date   HGBA1C 5.8 (H) 04/17/2020   HGBA1C 6.0 (H) 10/11/2019   HGBA1C 5.7  (H) 11/12/2018   Lab Results  Component Value Date   INSULIN 25.8 (H) 04/17/2020   INSULIN 34.1 (H) 10/11/2019   Lab Results  Component Value Date   TSH 2.540 10/11/2019   Lab Results  Component Value Date   CHOL 172 10/11/2019   HDL 36 (L) 10/11/2019   LDLCALC 108 (H) 10/11/2019   TRIG 155 (H) 10/11/2019   CHOLHDL 4.0 11/12/2018   Lab Results  Component Value Date   WBC 10.9 (H) 04/05/2020   HGB 13.0 04/05/2020   HCT 39.1 04/05/2020   MCV 84.8 04/05/2020   PLT 322 04/05/2020   No results found for: IRON, TIBC, FERRITIN  Attestation Statements:   Reviewed by clinician on day of visit: allergies, medications, problem list, medical history, surgical history, family history, social history, and previous encounter notes.   Trude Mcburney, am acting as Energy manager for [employer], DO.  I have reviewed the above documentation for accuracy and completeness, and I agree with the above. Corinna Capra, DO

## 2020-08-10 ENCOUNTER — Ambulatory Visit: Payer: Medicaid Other | Admitting: Nurse Practitioner

## 2020-08-23 ENCOUNTER — Ambulatory Visit (INDEPENDENT_AMBULATORY_CARE_PROVIDER_SITE_OTHER): Payer: Medicaid Other | Admitting: Bariatrics

## 2020-09-05 ENCOUNTER — Ambulatory Visit (INDEPENDENT_AMBULATORY_CARE_PROVIDER_SITE_OTHER): Payer: Medicaid Other | Admitting: Bariatrics

## 2020-09-05 ENCOUNTER — Encounter (INDEPENDENT_AMBULATORY_CARE_PROVIDER_SITE_OTHER): Payer: Self-pay | Admitting: Bariatrics

## 2020-09-05 ENCOUNTER — Other Ambulatory Visit: Payer: Self-pay

## 2020-09-05 VITALS — HR 73 | Temp 98.0°F | Ht 68.0 in | Wt 312.0 lb

## 2020-09-05 DIAGNOSIS — Z6841 Body Mass Index (BMI) 40.0 and over, adult: Secondary | ICD-10-CM | POA: Diagnosis not present

## 2020-09-05 DIAGNOSIS — K219 Gastro-esophageal reflux disease without esophagitis: Secondary | ICD-10-CM | POA: Diagnosis not present

## 2020-09-05 DIAGNOSIS — F5089 Other specified eating disorder: Secondary | ICD-10-CM

## 2020-09-05 DIAGNOSIS — R7303 Prediabetes: Secondary | ICD-10-CM

## 2020-09-05 DIAGNOSIS — G43809 Other migraine, not intractable, without status migrainosus: Secondary | ICD-10-CM

## 2020-09-05 MED ORDER — METFORMIN HCL 1000 MG PO TABS: 1000.0000 mg | ORAL_TABLET | Freq: Two times a day (BID) | ORAL | 0 refills | Status: DC

## 2020-09-05 MED ORDER — BUPROPION HCL ER (SR) 200 MG PO TB12: 200.0000 mg | ORAL_TABLET | Freq: Every day | ORAL | 0 refills | Status: DC

## 2020-09-05 MED ORDER — SUMATRIPTAN SUCCINATE 50 MG PO TABS: 50.0000 mg | ORAL_TABLET | ORAL | 0 refills | Status: DC | PRN

## 2020-09-09 ENCOUNTER — Encounter (INDEPENDENT_AMBULATORY_CARE_PROVIDER_SITE_OTHER): Payer: Self-pay | Admitting: Bariatrics

## 2020-09-09 NOTE — Progress Notes (Signed)
Chief Complaint:   OBESITY Cindy Dixon is here to discuss her progress with her obesity treatment plan along with follow-up of her obesity related diagnoses. Cindy Dixon is on the Category 3 Plan and states she is following her eating plan approximately 30% of the time. Cindy Dixon states she is walking 30 minutes 2 times per week.  Today's visit was #: 18 Starting weight: 321 lbs Starting date: 10/11/2019 Today's weight: 312 lbs Today's date: 09/05/2020 Total lbs lost to date: 9 lbs Total lbs lost since last in-office visit: 1 lb  Interim History: Cindy Dixon is down 1 lb. She reports to have a lot of stress and axiety.  Subjective:   1. Prediabetes Cindy Dixon has a diagnosis of prediabetes based on her elevated HgA1c and was informed this puts her at greater risk of developing diabetes. She continues to work on diet and exercise to decrease her risk of diabetes. She denies nausea or hypoglycemia. She is currently taking Metformin and Victoza (prescription is at the pharmacy).  Lab Results  Component Value Date   HGBA1C 5.8 (H) 04/17/2020   Lab Results  Component Value Date   INSULIN 25.8 (H) 04/17/2020   INSULIN 34.1 (H) 10/11/2019   2. Gastroesophageal reflux disease without esophagitis Cindy Dixon is currently taking Protonix.  3. Other migraine without status migrainosus, not intractable Cindy Dixon is taking Imitrex.  4. Other disorder of eating Cindy Dixon is taking Wellbutrin SR.  Assessment/Plan:   1. Prediabetes Cindy Dixon will continue to work on weight loss, exercise, and decreasing simple carbohydrates to help decrease the risk of diabetes.    metFORMIN (GLUCOPHAGE) 1000 MG tablet; Take 1 tablet (1,000 mg total) by mouth 2 (two) times daily with a meal.  Dispense: 60 tablet; Refill: 0  2. Gastroesophageal reflux disease without esophagitis Intensive lifestyle modifications are the first line treatment for this issue. We discussed several lifestyle  modifications today and she will continue to work on diet, exercise and weight loss efforts. Orders and follow up as documented in patient record.  Cindy Dixon will continue taking Protonix.  Counseling If a person has gastroesophageal reflux disease (GERD), food and stomach acid move back up into the esophagus and cause symptoms or problems such as damage to the esophagus. Anti-reflux measures include: raising the head of the bed, avoiding tight clothing or belts, avoiding eating late at night, not lying down shortly after mealtime, and achieving weight loss. Avoid ASA, NSAID's, caffeine, alcohol, and tobacco.  OTC Pepcid and/or Tums are often very helpful for as needed use.  However, for persisting chronic or daily symptoms, stronger medications like Omeprazole may be needed. You may need to avoid foods and drinks such as: Coffee and tea (with or without caffeine). Drinks that contain alcohol. Energy drinks and sports drinks. Bubbly (carbonated) drinks or sodas. Chocolate and cocoa. Peppermint and mint flavorings. Garlic and onions. Horseradish. Spicy and acidic foods. These include peppers, chili powder, curry powder, vinegar, hot sauces, and BBQ sauce. Citrus fruit juices and citrus fruits, such as oranges, lemons, and limes. Tomato-based foods. These include red sauce, chili, salsa, and pizza with red sauce. Fried and fatty foods. These include donuts, french fries, potato chips, and high-fat dressings. High-fat meats. These include hot dogs, rib eye steak, sausage, ham, and bacon.  - 3. Other migraine without status migrainosus, not intractable Continue Imitrex as needed - SUMAtriptan (IMITREX) 50 MG tablet; Take 1 tablet (50 mg total) by mouth as needed for migraine. May repeat in 2 hours if headache persists or recurs. Do  not exceed 2 tablets in 24 hours.  Dispense: 10 tablet; Refill: 0  4. Other disorder of eating Behavior modification techniques were discussed today to help  Cindy Dixon deal with her emotional/non-hunger eating behaviors. We will refill Wellbutrin SR for 1 month. Orders and follow up as documented in patient record. - buPROPion (WELLBUTRIN SR) 200 MG 12 hr tablet; Take 1 tablet (200 mg total) by mouth daily.  Dispense: 30 tablet; Refill: 0  5. Obesity, current BMI 47.5 Cindy Dixon is currently in the action stage of change. As such, her goal is to continue with weight loss efforts. She has agreed to the Category 3 Plan.   Exercise goals: As is  Behavioral modification strategies: increasing lean protein intake, decreasing simple carbohydrates, increasing vegetables, increasing water intake, decreasing eating out, no skipping meals, meal planning and cooking strategies, keeping healthy foods in the home, and planning for success.  Cindy Dixon has agreed to follow-up with our clinic in 4 weeks. She was informed of the importance of frequent follow-up visits to maximize her success with intensive lifestyle modifications for her multiple health conditions.   Objective:   Pulse 73, temperature 98 F (36.7 C), height 5\' 8"  (1.727 m), weight (!) 312 lb (141.5 kg), SpO2 97 %. Body mass index is 47.44 kg/m.  General: Cooperative, alert, well developed, in no acute distress. HEENT: Conjunctivae and lids unremarkable. Cardiovascular: Regular rhythm.  Lungs: Normal work of breathing. Neurologic: No focal deficits.   Lab Results  Component Value Date   CREATININE 0.64 04/05/2020   BUN 9 04/05/2020   NA 138 04/05/2020   K 3.7 04/05/2020   CL 104 04/05/2020   CO2 23 04/05/2020   Lab Results  Component Value Date   ALT 22 04/05/2020   AST 21 04/05/2020   ALKPHOS 58 04/05/2020   BILITOT 0.4 04/05/2020   Lab Results  Component Value Date   HGBA1C 5.8 (H) 04/17/2020   HGBA1C 6.0 (H) 10/11/2019   HGBA1C 5.7 (H) 11/12/2018   Lab Results  Component Value Date   INSULIN 25.8 (H) 04/17/2020   INSULIN 34.1 (H) 10/11/2019   Lab Results  Component  Value Date   TSH 2.540 10/11/2019   Lab Results  Component Value Date   CHOL 172 10/11/2019   HDL 36 (L) 10/11/2019   LDLCALC 108 (H) 10/11/2019   TRIG 155 (H) 10/11/2019   CHOLHDL 4.0 11/12/2018   Lab Results  Component Value Date   VD25OH 25.3 (L) 04/17/2020   VD25OH 18.6 (L) 10/11/2019   Lab Results  Component Value Date   WBC 10.9 (H) 04/05/2020   HGB 13.0 04/05/2020   HCT 39.1 04/05/2020   MCV 84.8 04/05/2020   PLT 322 04/05/2020   No results found for: IRON, TIBC, FERRITIN  Attestation Statements:   Reviewed by clinician on day of visit: allergies, medications, problem list, medical history, surgical history, family history, social history, and previous encounter notes.  I02/25/2022, CMA, am acting as transcriptionist for Dr. Paulla Fore, DO.  I have reviewed the above documentation for accuracy and completeness, and I agree with the above. Corinna Capra, DO

## 2020-09-26 ENCOUNTER — Ambulatory Visit (INDEPENDENT_AMBULATORY_CARE_PROVIDER_SITE_OTHER): Payer: Medicaid Other | Admitting: Bariatrics

## 2020-09-26 ENCOUNTER — Other Ambulatory Visit: Payer: Self-pay

## 2020-09-26 ENCOUNTER — Encounter (INDEPENDENT_AMBULATORY_CARE_PROVIDER_SITE_OTHER): Payer: Self-pay | Admitting: Bariatrics

## 2020-09-26 VITALS — BP 132/86 | HR 70 | Temp 98.2°F | Ht 65.0 in | Wt 317.0 lb

## 2020-09-26 DIAGNOSIS — F3289 Other specified depressive episodes: Secondary | ICD-10-CM | POA: Diagnosis not present

## 2020-09-26 DIAGNOSIS — K219 Gastro-esophageal reflux disease without esophagitis: Secondary | ICD-10-CM

## 2020-09-26 DIAGNOSIS — F5089 Other specified eating disorder: Secondary | ICD-10-CM | POA: Diagnosis not present

## 2020-09-26 DIAGNOSIS — Z6841 Body Mass Index (BMI) 40.0 and over, adult: Secondary | ICD-10-CM

## 2020-09-26 DIAGNOSIS — G43809 Other migraine, not intractable, without status migrainosus: Secondary | ICD-10-CM

## 2020-09-26 DIAGNOSIS — R7303 Prediabetes: Secondary | ICD-10-CM

## 2020-09-26 MED ORDER — PANTOPRAZOLE SODIUM 40 MG PO TBEC: 40.0000 mg | DELAYED_RELEASE_TABLET | Freq: Every day | ORAL | 0 refills | Status: DC

## 2020-09-26 MED ORDER — ONDANSETRON HCL 4 MG PO TABS: 4.0000 mg | ORAL_TABLET | Freq: Three times a day (TID) | ORAL | 1 refills | Status: DC | PRN

## 2020-09-26 MED ORDER — SUMATRIPTAN SUCCINATE 50 MG PO TABS: 50.0000 mg | ORAL_TABLET | ORAL | 0 refills | Status: DC | PRN

## 2020-09-26 MED ORDER — VICTOZA 18 MG/3ML ~~LOC~~ SOPN
PEN_INJECTOR | SUBCUTANEOUS | 0 refills | Status: DC
Start: 2020-09-26 — End: 2020-10-18

## 2020-09-26 MED ORDER — METFORMIN HCL 1000 MG PO TABS: 1000.0000 mg | ORAL_TABLET | Freq: Two times a day (BID) | ORAL | 0 refills | Status: DC

## 2020-09-26 MED ORDER — SERTRALINE HCL 100 MG PO TABS: 100.0000 mg | ORAL_TABLET | Freq: Every day | ORAL | 0 refills | Status: DC

## 2020-09-26 MED ORDER — BUPROPION HCL ER (SR) 200 MG PO TB12: 200.0000 mg | ORAL_TABLET | Freq: Every day | ORAL | 0 refills | Status: DC

## 2020-09-27 NOTE — Progress Notes (Signed)
Chief Complaint:   OBESITY Cindy Dixon is here to discuss her progress with her obesity treatment plan along with follow-up of her obesity related diagnoses. Cindy Dixon is on the Category 3 Plan and states she is following her eating plan approximately 40% of the time. Cindy Dixon states she is walking for 30 minutes 2 times per week.  Today's visit was #: 19 Starting weight: 321 lbs Starting date: 10/11/2019 Today's weight: 317 lbs Today's date:09/26/2020 Total lbs lost to date: 4 lbs Total lbs lost since last in-office visit: 0  Interim History: Cindy Dixon is up 5 lbs since her last visit. Interpretor present throughout the visit. ( Ana A Pevida ).  Subjective:   1. Other disorder of eating Cindy Dixon is currently taking Wellbutrin SR.  2. Prediabetes Cindy Dixon has a diagnosis of prediabetes based on her elevated HgA1c and was informed this puts her at greater risk of developing diabetes. She continues to work on diet and exercise to decrease her risk of diabetes. She denies nausea or hypoglycemia.  3. Gastroesophageal reflux disease without esophagitis Cindy Dixon is currently taking Protonix.   4. Other depression Cindy Dixon is currently taking Zoloft.    5. Other migraine without status migrainosus, not intractable Cindy Dixon will go to the ER if headaches worsen or change.   Assessment/Plan:   1. Other disorder of eating Behavior modification techniques were discussed today to help Arianni deal with her emotional/non-hunger eating behaviors.  We will refill Wellbutrin SR 200 for 1 month with no refills. Orders and follow up as documented in patient record.    - buPROPion (WELLBUTRIN SR) 200 MG 12 hr tablet; Take 1 tablet (200 mg total) by mouth daily.  Dispense: 30 tablet; Refill: 0  2. Prediabetes Cindy Dixon will continue to work on weight loss, exercise, and decreasing simple carbohydrates to help decrease the risk of diabetes. We will refill Metformin 1000 mg  for 1 month with no refills. She was put on Victoza but she has not taken it.  - liraglutide (VICTOZA) 18 MG/3ML SOPN; Start 0.6mg  SQ once a day for 7 days, then increase to 1.2mg  once a day  Dispense: 6 mL; Refill: 0  - metFORMIN (GLUCOPHAGE) 1000 MG tablet; Take 1 tablet (1,000 mg total) by mouth 2 (two) times daily with a meal.  Dispense: 60 tablet; Refill: 0  3. Gastroesophageal reflux disease without esophagitis Intensive lifestyle modifications are the first line treatment for this issue. We discussed several lifestyle modifications today and she will continue to work on diet, exercise and weight loss efforts. We will refill Protonix 40 mg for 90 days with no refills. Orders and follow up as documented in patient record.   Counseling If a person has gastroesophageal reflux disease (GERD), food and stomach acid move back up into the esophagus and cause symptoms or problems such as damage to the esophagus. Anti-reflux measures include: raising the head of the bed, avoiding tight clothing or belts, avoiding eating late at night, not lying down shortly after mealtime, and achieving weight loss. Avoid ASA, NSAID's, caffeine, alcohol, and tobacco.  OTC Pepcid and/or Tums are often very helpful for as needed use.  However, for persisting chronic or daily symptoms, stronger medications like Omeprazole may be needed. You may need to avoid foods and drinks such as: Coffee and tea (with or without caffeine). Drinks that contain alcohol. Energy drinks and sports drinks. Bubbly (carbonated) drinks or sodas. Chocolate and cocoa. Peppermint and mint flavorings. Garlic and onions. Horseradish. Spicy and acidic foods. These include  peppers, chili powder, curry powder, vinegar, hot sauces, and BBQ sauce. Citrus fruit juices and citrus fruits, such as oranges, lemons, and limes. Tomato-based foods. These include red sauce, chili, salsa, and pizza with red sauce. Fried and fatty foods. These include  donuts, french fries, potato chips, and high-fat dressings. High-fat meats. These include hot dogs, rib eye steak, sausage, ham, and bacon.   - pantoprazole (PROTONIX) 40 MG tablet; Take 1 tablet (40 mg total) by mouth daily.  Dispense: 90 tablet; Refill: 0  4. Other depression Behavior modification techniques were discussed today to help Cindy Dixon deal with her emotional/non-hunger eating behaviors.  We will refill Zoloft 100 for 1 month with no refills. Orders and follow up as documented in patient record.    - sertraline (ZOLOFT) 100 MG tablet; Take 1 tablet (100 mg total) by mouth daily.  Dispense: 30 tablet; Refill: 0  5. Other migraine without status migrainosus, not intractable We will refill Imitrex 50 PRN for migraine migrainosus repeat in 2 hrs 10 tablet with no refills. A neurologist referral was made.  Referral to PCP. Cindy Dixon agrees to start  Zofran 4 mg PRN for nausea and vomiting 20 tablets with 1 refills.  - SUMAtriptan (IMITREX) 50 MG tablet; Take 1 tablet (50 mg total) by mouth as needed for migraine. May repeat in 2 hours if headache persists or recurs. Do not exceed 2 tablets in 24 hours.  Dispense: 10 tablet; Refill: 0 - Ambulatory referral to Neurology   6. Obesity, current BMI 52.8 Cindy Dixon is currently in the action stage of change. As such, her goal is to continue with weight loss efforts. She has agreed to the Category 3 Plan.   Cindy Dixon will continue meal planning. She will decrease carbohydrates and increase protein. She will take unhealthy snacks out of the house  Exercise goals:  Cindy Dixon will increase walking.  Behavioral modification strategies: increasing lean protein intake, decreasing simple carbohydrates, increasing vegetables, increasing water intake, decreasing eating out, no skipping meals, meal planning and cooking strategies, keeping healthy foods in the home, and planning for success.  Cindy Dixon has agreed to follow-up with our clinic in  3 weeks. She was informed of the importance of frequent follow-up visits to maximize her success with intensive lifestyle modifications for her multiple health conditions.   Objective:   Blood pressure 132/86, pulse 70, temperature 98.2 F (36.8 C), height 5\' 5"  (1.651 m), weight (!) 317 lb (143.8 kg), SpO2 98 %. Body mass index is 52.75 kg/m.  General: Cooperative, alert, well developed, in no acute distress. HEENT: Conjunctivae and lids unremarkable. Cardiovascular: Regular rhythm.  Lungs: Normal work of breathing. Neurologic: No focal deficits.   Lab Results  Component Value Date   CREATININE 0.64 04/05/2020   BUN 9 04/05/2020   NA 138 04/05/2020   K 3.7 04/05/2020   CL 104 04/05/2020   CO2 23 04/05/2020   Lab Results  Component Value Date   ALT 22 04/05/2020   AST 21 04/05/2020   ALKPHOS 58 04/05/2020   BILITOT 0.4 04/05/2020   Lab Results  Component Value Date   HGBA1C 5.8 (H) 04/17/2020   HGBA1C 6.0 (H) 10/11/2019   HGBA1C 5.7 (H) 11/12/2018   Lab Results  Component Value Date   INSULIN 25.8 (H) 04/17/2020   INSULIN 34.1 (H) 10/11/2019   Lab Results  Component Value Date   TSH 2.540 10/11/2019   Lab Results  Component Value Date   CHOL 172 10/11/2019   HDL 36 (L) 10/11/2019  LDLCALC 108 (H) 10/11/2019   TRIG 155 (H) 10/11/2019   CHOLHDL 4.0 11/12/2018   Lab Results  Component Value Date   VD25OH 25.3 (L) 04/17/2020   VD25OH 18.6 (L) 10/11/2019   Lab Results  Component Value Date   WBC 10.9 (H) 04/05/2020   HGB 13.0 04/05/2020   HCT 39.1 04/05/2020   MCV 84.8 04/05/2020   PLT 322 04/05/2020   No results found for: IRON, TIBC, FERRITIN  Attestation Statements:   Reviewed by clinician on day of visit: allergies, medications, problem list, medical history, surgical history, family history, social history, and previous encounter notes.   I, Jackson Latino, RMA, am acting as Energy manager for [employer], DO.   I have reviewed the above  documentation for accuracy and completeness, and I agree with the above. Corinna Capra, DO

## 2020-09-29 ENCOUNTER — Encounter (INDEPENDENT_AMBULATORY_CARE_PROVIDER_SITE_OTHER): Payer: Self-pay | Admitting: Bariatrics

## 2020-10-04 ENCOUNTER — Other Ambulatory Visit (INDEPENDENT_AMBULATORY_CARE_PROVIDER_SITE_OTHER): Payer: Self-pay | Admitting: Bariatrics

## 2020-10-04 DIAGNOSIS — G43809 Other migraine, not intractable, without status migrainosus: Secondary | ICD-10-CM

## 2020-10-09 ENCOUNTER — Ambulatory Visit: Payer: Medicaid Other | Admitting: Neurology

## 2020-10-11 ENCOUNTER — Ambulatory Visit (INDEPENDENT_AMBULATORY_CARE_PROVIDER_SITE_OTHER): Payer: Medicaid Other | Admitting: Bariatrics

## 2020-10-18 ENCOUNTER — Encounter (INDEPENDENT_AMBULATORY_CARE_PROVIDER_SITE_OTHER): Payer: Self-pay | Admitting: Bariatrics

## 2020-10-18 ENCOUNTER — Ambulatory Visit (INDEPENDENT_AMBULATORY_CARE_PROVIDER_SITE_OTHER): Payer: Medicaid Other | Admitting: Bariatrics

## 2020-10-18 ENCOUNTER — Other Ambulatory Visit: Payer: Self-pay

## 2020-10-18 VITALS — BP 123/83 | HR 67 | Temp 97.9°F | Ht 65.0 in | Wt 313.0 lb

## 2020-10-18 DIAGNOSIS — E559 Vitamin D deficiency, unspecified: Secondary | ICD-10-CM | POA: Diagnosis not present

## 2020-10-18 DIAGNOSIS — G43809 Other migraine, not intractable, without status migrainosus: Secondary | ICD-10-CM

## 2020-10-18 DIAGNOSIS — R7303 Prediabetes: Secondary | ICD-10-CM

## 2020-10-18 DIAGNOSIS — K219 Gastro-esophageal reflux disease without esophagitis: Secondary | ICD-10-CM | POA: Diagnosis not present

## 2020-10-18 DIAGNOSIS — Z6841 Body Mass Index (BMI) 40.0 and over, adult: Secondary | ICD-10-CM | POA: Diagnosis not present

## 2020-10-18 MED ORDER — BD PEN NEEDLE MINI U/F 31G X 5 MM MISC
0 refills | Status: DC
Start: 2020-10-18 — End: 2020-12-28

## 2020-10-18 MED ORDER — PANTOPRAZOLE SODIUM 40 MG PO TBEC: 40.0000 mg | DELAYED_RELEASE_TABLET | Freq: Every day | ORAL | 0 refills | Status: DC

## 2020-10-18 MED ORDER — ONDANSETRON HCL 4 MG PO TABS: 4.0000 mg | ORAL_TABLET | Freq: Three times a day (TID) | ORAL | 1 refills | Status: DC | PRN

## 2020-10-18 MED ORDER — VICTOZA 18 MG/3ML ~~LOC~~ SOPN: PEN_INJECTOR | SUBCUTANEOUS | 0 refills | Status: DC

## 2020-10-18 MED ORDER — VITAMIN D (ERGOCALCIFEROL) 1.25 MG (50000 UNIT) PO CAPS: 50000.0000 [IU] | ORAL_CAPSULE | ORAL | 0 refills | Status: DC

## 2020-10-18 MED ORDER — SUMATRIPTAN SUCCINATE 50 MG PO TABS: 50.0000 mg | ORAL_TABLET | ORAL | 0 refills | Status: DC | PRN

## 2020-10-18 MED ORDER — METFORMIN HCL 1000 MG PO TABS: 1000.0000 mg | ORAL_TABLET | Freq: Two times a day (BID) | ORAL | 0 refills | Status: DC

## 2020-10-19 LAB — VITAMIN D 25 HYDROXY (VIT D DEFICIENCY, FRACTURES): Vit D, 25-Hydroxy: 26.3 ng/mL — ABNORMAL LOW (ref 30.0–100.0)

## 2020-10-19 NOTE — Progress Notes (Signed)
Chief Complaint:   OBESITY Pheonix is here to discuss her progress with her obesity treatment plan along with follow-up of her obesity related diagnoses. Cindy Dixon is on the Category 3 Plan and states she is following her eating plan approximately 0% of the time. Cindy Dixon states she is walking for 20 minutes 1 times per week.  Today's visit was #: 20 Starting weight: 321 lbs Starting date: 10/11/2019 Today's weight: 313 lbs Today's date: 10/18/2020 Total lbs lost to date: 8 lbs Total lbs lost since last in-office visit: 4 lbs  Interim History: Cindy Dixon is down 4 lbs since her last visit.  Subjective:   1. Prediabetes Cindy Dixon has a diagnosis of prediabetes based on her elevated HgA1c and was informed this puts her at greater risk of developing diabetes. She continues to work on diet and exercise to decrease her risk of diabetes. Her appetite is normal to high.   2. Gastroesophageal reflux disease without esophagitis Cindy Dixon is currently taking Protonix.    3. Other migraine without status migrainosus, not intractable Cindy Dixon is currently taking Imitrex and Zofran.  4. Vitamin D deficiency She is currently taking prescription vitamin D 50,000 IU each week. She denies nausea, vomiting or muscle weakness.  Assessment/Plan:   1. Prediabetes Cindy Dixon will continue to work on weight loss, exercise, and decreasing simple carbohydrates to help decrease the risk of diabetes. We will refill Insulin Pen Needle 31G X 5 MM 100 with no refills. We will refill Metformin 1000 1 tablet 2 times daily with a meal for 1 month with no refills. We will refill Victoza 1.8 mg sq increased dose daily with no refills.   - Insulin Pen Needle (B-D UF III MINI PEN NEEDLES) 31G X 5 MM MISC; USE WITH THE VICTOZA  Dispense: 100 each; Refill: 0  - liraglutide (VICTOZA) 18 MG/3ML SOPN; 1.8 mg SQ once a day.  Dispense: 9 mL; Refill: 0  - metFORMIN (GLUCOPHAGE) 1000 MG tablet; Take 1 tablet  (1,000 mg total) by mouth 2 (two) times daily with a meal.  Dispense: 60 tablet; Refill: 0  2. Gastroesophageal reflux disease without esophagitis Intensive lifestyle modifications are the first line treatment for this issue. We discussed several lifestyle modifications today and she will continue to work on diet, exercise and weight loss efforts. We will refill Protonix 40 mg for 90 days with no refills.Orders and follow up as documented in patient record.   Counseling If a person has gastroesophageal reflux disease (GERD), food and stomach acid move back up into the esophagus and cause symptoms or problems such as damage to the esophagus. Anti-reflux measures include: raising the head of the bed, avoiding tight clothing or belts, avoiding eating late at night, not lying down shortly after mealtime, and achieving weight loss. Avoid ASA, NSAID's, caffeine, alcohol, and tobacco.  OTC Pepcid and/or Tums are often very helpful for as needed use.  However, for persisting chronic or daily symptoms, stronger medications like Omeprazole may be needed. You may need to avoid foods and drinks such as: Coffee and tea (with or without caffeine). Drinks that contain alcohol. Energy drinks and sports drinks. Bubbly (carbonated) drinks or sodas. Chocolate and cocoa. Peppermint and mint flavorings. Garlic and onions. Horseradish. Spicy and acidic foods. These include peppers, chili powder, curry powder, vinegar, hot sauces, and BBQ sauce. Citrus fruit juices and citrus fruits, such as oranges, lemons, and limes. Tomato-based foods. These include red sauce, chili, salsa, and pizza with red sauce. Fried and fatty foods. These  include donuts, french fries, potato chips, and high-fat dressings. High-fat meats. These include hot dogs, rib eye steak, sausage, ham, and bacon.  - pantoprazole (PROTONIX) 40 MG tablet; Take 1 tablet (40 mg total) by mouth daily.  Dispense: 90 tablet; Refill: 0  3. Other migraine  without status migrainosus, not intractable We will refill Imitrex 50 mg PRN with no refills. We will refill Zofran 4 mg with no refills.   - SUMAtriptan (IMITREX) 50 MG tablet; Take 1 tablet (50 mg total) by mouth as needed for migraine. May repeat in 2 hours if headache persists or recurs. Do not exceed 2 tablets in 24 hours.  Dispense: 10 tablet; Refill: 0  4. Vitamin D deficiency Low Vitamin D level contributes to fatigue and are associated with obesity, breast, and colon cancer. We will refill prescription Vitamin D 50,000 IU every week for 1 month with no refills and Anaiah will follow-up for routine testing of Vitamin D, at least 2-3 times per year to avoid over-replacement.  - Vitamin D, Ergocalciferol, (DRISDOL) 1.25 MG (50000 UNIT) CAPS capsule; Take 1 capsule (50,000 Units total) by mouth every 7 (seven) days.  Dispense: 4 capsule; Refill: 0 - VITAMIN D 25 Hydroxy (Vit-D Deficiency, Fractures)  5. Obesity, current BMI 52.1 Cindy Dixon is currently in the action stage of change. As such, her goal is to continue with weight loss efforts. She has agreed to the Category 3 Plan.   Cindy Dixon will continue meal planning. She will continue intentional eating. She will decrease bread and rice.  Exercise goals:  As is.  Behavioral modification strategies: increasing lean protein intake, decreasing simple carbohydrates, increasing vegetables, increasing water intake, decreasing eating out, no skipping meals, meal planning and cooking strategies, keeping healthy foods in the home, and planning for success.  Cindy Dixon has agreed to follow-up with our clinic in 4 weeks. She was informed of the importance of frequent follow-up visits to maximize her success with intensive lifestyle modifications for her multiple health conditions.   Cindy Dixon was informed we would discuss her lab results at her next visit unless there is a critical issue that needs to be addressed sooner. Cindy Dixon agreed to  keep her next visit at the agreed upon time to discuss these results.  Objective:   Blood pressure 123/83, pulse 67, temperature 97.9 F (36.6 C), height 5\' 5"  (1.651 m), weight (!) 313 lb (142 kg), SpO2 97 %. Body mass index is 52.09 kg/m.  General: Cooperative, alert, well developed, in no acute distress. HEENT: Conjunctivae and lids unremarkable. Cardiovascular: Regular rhythm.  Lungs: Normal work of breathing. Neurologic: No focal deficits.   Lab Results  Component Value Date   CREATININE 0.64 04/05/2020   BUN 9 04/05/2020   NA 138 04/05/2020   K 3.7 04/05/2020   CL 104 04/05/2020   CO2 23 04/05/2020   Lab Results  Component Value Date   ALT 22 04/05/2020   AST 21 04/05/2020   ALKPHOS 58 04/05/2020   BILITOT 0.4 04/05/2020   Lab Results  Component Value Date   HGBA1C 5.8 (H) 04/17/2020   HGBA1C 6.0 (H) 10/11/2019   HGBA1C 5.7 (H) 11/12/2018   Lab Results  Component Value Date   INSULIN 25.8 (H) 04/17/2020   INSULIN 34.1 (H) 10/11/2019   Lab Results  Component Value Date   TSH 2.540 10/11/2019   Lab Results  Component Value Date   CHOL 172 10/11/2019   HDL 36 (L) 10/11/2019   LDLCALC 108 (H) 10/11/2019   TRIG  155 (H) 10/11/2019   CHOLHDL 4.0 11/12/2018   Lab Results  Component Value Date   VD25OH 25.3 (L) 04/17/2020   VD25OH 18.6 (L) 10/11/2019   Lab Results  Component Value Date   WBC 10.9 (H) 04/05/2020   HGB 13.0 04/05/2020   HCT 39.1 04/05/2020   MCV 84.8 04/05/2020   PLT 322 04/05/2020   No results found for: IRON, TIBC, FERRITIN  Attestation Statements:   Reviewed by clinician on day of visit: allergies, medications, problem list, medical history, surgical history, family history, social history, and previous encounter notes.   I, Jackson Latino, RMA, am acting as Energy manager for [employer], DO.   I have reviewed the above documentation for accuracy and completeness, and I agree with the above. Corinna Capra, DO

## 2020-10-22 ENCOUNTER — Encounter (INDEPENDENT_AMBULATORY_CARE_PROVIDER_SITE_OTHER): Payer: Self-pay | Admitting: Bariatrics

## 2020-11-15 ENCOUNTER — Ambulatory Visit (INDEPENDENT_AMBULATORY_CARE_PROVIDER_SITE_OTHER): Payer: Medicaid Other | Admitting: Bariatrics

## 2020-11-16 ENCOUNTER — Ambulatory Visit (INDEPENDENT_AMBULATORY_CARE_PROVIDER_SITE_OTHER): Payer: Medicaid Other | Admitting: Bariatrics

## 2020-11-16 ENCOUNTER — Encounter (INDEPENDENT_AMBULATORY_CARE_PROVIDER_SITE_OTHER): Payer: Self-pay | Admitting: Bariatrics

## 2020-11-16 ENCOUNTER — Other Ambulatory Visit: Payer: Self-pay

## 2020-11-16 VITALS — BP 132/91 | HR 67 | Temp 98.2°F | Ht 65.0 in | Wt 316.0 lb

## 2020-11-16 DIAGNOSIS — E559 Vitamin D deficiency, unspecified: Secondary | ICD-10-CM

## 2020-11-16 DIAGNOSIS — R7303 Prediabetes: Secondary | ICD-10-CM

## 2020-11-16 DIAGNOSIS — F5089 Other specified eating disorder: Secondary | ICD-10-CM

## 2020-11-16 DIAGNOSIS — Z6841 Body Mass Index (BMI) 40.0 and over, adult: Secondary | ICD-10-CM

## 2020-11-16 DIAGNOSIS — F3289 Other specified depressive episodes: Secondary | ICD-10-CM | POA: Diagnosis not present

## 2020-11-16 DIAGNOSIS — G43809 Other migraine, not intractable, without status migrainosus: Secondary | ICD-10-CM

## 2020-11-16 MED ORDER — CHOLECALCIFEROL 1.25 MG (50000 UT) PO TABS: 50000.0000 [IU] | ORAL_TABLET | ORAL | 0 refills | Status: DC

## 2020-11-16 MED ORDER — SERTRALINE HCL 100 MG PO TABS: 100.0000 mg | ORAL_TABLET | Freq: Every day | ORAL | 0 refills | Status: DC

## 2020-11-16 MED ORDER — VICTOZA 18 MG/3ML ~~LOC~~ SOPN: PEN_INJECTOR | SUBCUTANEOUS | 0 refills | Status: DC

## 2020-11-16 MED ORDER — BUPROPION HCL ER (SR) 200 MG PO TB12: 200.0000 mg | ORAL_TABLET | Freq: Every day | ORAL | 0 refills | Status: DC

## 2020-11-16 MED ORDER — SUMATRIPTAN SUCCINATE 50 MG PO TABS: 50.0000 mg | ORAL_TABLET | ORAL | 0 refills | Status: DC | PRN

## 2020-11-16 MED ORDER — METFORMIN HCL 1000 MG PO TABS: 1000.0000 mg | ORAL_TABLET | Freq: Two times a day (BID) | ORAL | 0 refills | Status: DC

## 2020-11-16 MED ORDER — ONDANSETRON HCL 4 MG PO TABS: 4.0000 mg | ORAL_TABLET | Freq: Three times a day (TID) | ORAL | 1 refills | Status: DC | PRN

## 2020-11-16 NOTE — Progress Notes (Signed)
Chief Complaint:   OBESITY Cindy Dixon is here to discuss her progress with her obesity treatment plan along with follow-up of her obesity related diagnoses. Cindy Dixon is on the Category 3 Plan and states she is following her eating plan approximately 40% of the time. Cindy Dixon states she is walking for 30 minutes 5 times per week.  Today's visit was #: 21 Starting weight: 321 lbs Starting date: 10/11/2019 Today's weight: 316 lbs Today's date: 11/16/2020 Total lbs lost to date: 5 lbs Total lbs lost since last in-office visit: 0  Interim History: Interpreter: Eddie. Cindy Dixon is up 3 additional pounds since her last visit. She states that she has had some nausea, possibly secondary to Victoza.  Subjective:   1. Prediabetes Cindy Dixon is on Metformin and she started Victoza (1.8 mg). She was increased from 0.6 mg to 1.8 mg too fast.  She is taking Zofran.  2. Other migraine without status migrainosus, not intractable Cindy Dixon is currently taking Imitrex 50 mg.  3. Vitamin D deficiency Cindy Dixon is taking her medications as directed.  4. Other disorder of eating Cindy Dixon is currently taking Wellbutrin SR 200 mg.  5. Other depression Cindy Dixon is taking Zoloft 100 mg currently.   Assessment/Plan:   1. Prediabetes Cindy Dixon will go back to the dose 1.2 mg of Victoza. She will continue with Victoza 1.2 mg daily. She will continue to work on weight loss, exercise, and decreasing simple carbohydrates to help decrease the risk of diabetes.   - liraglutide (VICTOZA) 18 MG/3ML SOPN; 1.2 mg SQ once a day.  Dispense: 9 mL; Refill: 0 - metFORMIN (GLUCOPHAGE) 1000 MG tablet; Take 1 tablet (1,000 mg total) by mouth 2 (two) times daily with a meal.  Dispense: 60 tablet; Refill: 0  2. Other migraine without status migrainosus, not intractable We will refill Imitrex 50 mg with no refills.   - SUMAtriptan (IMITREX) 50 MG tablet; Take 1 tablet (50 mg total) by mouth as needed for  migraine. May repeat in 2 hours if headache persists or recurs. Do not exceed 2 tablets in 24 hours.  Dispense: 10 tablet; Refill: 0  3. Vitamin D deficiency Low Vitamin D level contributes to fatigue and are associated with obesity, breast, and colon cancer. We will change from Vitamin D 2 to Vitamin D 3. We will refill  Vitamin D 3 50,000 IU every week for 1 month with no refills and Ruby will follow-up for routine testing of Vitamin D, at least 2-3 times per year to avoid over-replacement.  - Cholecalciferol 1.25 MG (50000 UT) TABS; Take 50,000 Units by mouth once a week.  Dispense: 4 tablet; Refill: 0  4. Other disorder of eating Behavior modification techniques were discussed today to help Cindy Dixon deal with her emotional/non-hunger eating behaviors.  We will refill Wellbutrin SR 200 mg for 1 month with no refills. Orders and follow up as documented in patient record.    - buPROPion (WELLBUTRIN SR) 200 MG 12 hr tablet; Take 1 tablet (200 mg total) by mouth daily.  Dispense: 30 tablet; Refill: 0  5. Other depression Behavior modification techniques were discussed today to help Cindy Dixon deal with her emotional/non-hunger eating behaviors.  We will refill Zoloft 100 mg for 1 month with no refills. Orders and follow up as documented in patient record.    - sertraline (ZOLOFT) 100 MG tablet; Take 1 tablet (100 mg total) by mouth daily.  Dispense: 30 tablet; Refill: 0  6. Obesity, current BMI 52.1 Cindy Dixon is currently in the  action stage of change. As such, her goal is to continue with weight loss efforts. She has agreed to the Category 3 Plan.   Cindy Dixon will continue meal planning. She will make better choices and decreased carbohydrates.  Exercise goals:  As is.  Behavioral modification strategies: increasing lean protein intake, decreasing simple carbohydrates, increasing vegetables, increasing water intake, decreasing eating out, no skipping meals, meal planning and cooking  strategies, keeping healthy foods in the home, and planning for success.  Cindy Dixon has agreed to follow-up with our clinic in 4 weeks. She was informed of the importance of frequent follow-up visits to maximize her success with intensive lifestyle modifications for her multiple health conditions.   Objective:   Blood pressure (!) 132/91, pulse 67, temperature 98.2 F (36.8 C), height 5\' 5"  (1.651 m), weight (!) 316 lb (143.3 kg), SpO2 98 %. Body mass index is 52.59 kg/m.  General: Cooperative, alert, well developed, in no acute distress. HEENT: Conjunctivae and lids unremarkable. Cardiovascular: Regular rhythm.  Lungs: Normal work of breathing. Neurologic: No focal deficits.   Lab Results  Component Value Date   CREATININE 0.64 04/05/2020   BUN 9 04/05/2020   NA 138 04/05/2020   K 3.7 04/05/2020   CL 104 04/05/2020   CO2 23 04/05/2020   Lab Results  Component Value Date   ALT 22 04/05/2020   AST 21 04/05/2020   ALKPHOS 58 04/05/2020   BILITOT 0.4 04/05/2020   Lab Results  Component Value Date   HGBA1C 5.8 (H) 04/17/2020   HGBA1C 6.0 (H) 10/11/2019   HGBA1C 5.7 (H) 11/12/2018   Lab Results  Component Value Date   INSULIN 25.8 (H) 04/17/2020   INSULIN 34.1 (H) 10/11/2019   Lab Results  Component Value Date   TSH 2.540 10/11/2019   Lab Results  Component Value Date   CHOL 172 10/11/2019   HDL 36 (L) 10/11/2019   LDLCALC 108 (H) 10/11/2019   TRIG 155 (H) 10/11/2019   CHOLHDL 4.0 11/12/2018   Lab Results  Component Value Date   VD25OH 26.3 (L) 10/18/2020   VD25OH 25.3 (L) 04/17/2020   VD25OH 18.6 (L) 10/11/2019   Lab Results  Component Value Date   WBC 10.9 (H) 04/05/2020   HGB 13.0 04/05/2020   HCT 39.1 04/05/2020   MCV 84.8 04/05/2020   PLT 322 04/05/2020   No results found for: IRON, TIBC, FERRITIN  Attestation Statements:   Reviewed by clinician on day of visit: allergies, medications, problem list, medical history, surgical history, family  history, social history, and previous encounter notes.  I, 04/07/2020, RMA, am acting as Jackson Latino for Energy manager, DO.   I have reviewed the above documentation for accuracy and completeness, and I agree with the above. [employer], DO

## 2020-11-20 ENCOUNTER — Encounter (INDEPENDENT_AMBULATORY_CARE_PROVIDER_SITE_OTHER): Payer: Self-pay | Admitting: Bariatrics

## 2020-12-14 ENCOUNTER — Ambulatory Visit (INDEPENDENT_AMBULATORY_CARE_PROVIDER_SITE_OTHER): Payer: Medicaid Other | Admitting: Bariatrics

## 2020-12-16 ENCOUNTER — Other Ambulatory Visit: Payer: Self-pay

## 2020-12-16 ENCOUNTER — Emergency Department (HOSPITAL_COMMUNITY)
Admission: EM | Admit: 2020-12-16 | Discharge: 2020-12-16 | Disposition: A | Discharge status: Home / Self Care | Payer: Medicaid Other | Attending: Emergency Medicine | Admitting: Emergency Medicine

## 2020-12-16 DIAGNOSIS — Z7984 Long term (current) use of oral hypoglycemic drugs: Secondary | ICD-10-CM | POA: Diagnosis not present

## 2020-12-16 DIAGNOSIS — Z794 Long term (current) use of insulin: Secondary | ICD-10-CM | POA: Insufficient documentation

## 2020-12-16 DIAGNOSIS — U071 COVID-19: Secondary | ICD-10-CM | POA: Diagnosis not present

## 2020-12-16 DIAGNOSIS — R7303 Prediabetes: Secondary | ICD-10-CM | POA: Insufficient documentation

## 2020-12-16 DIAGNOSIS — R509 Fever, unspecified: Secondary | ICD-10-CM | POA: Diagnosis present

## 2020-12-16 LAB — RESP PANEL BY RT-PCR (FLU A&B, COVID) ARPGX2
Influenza A by PCR: NEGATIVE
Influenza B by PCR: NEGATIVE
SARS Coronavirus 2 by RT PCR: POSITIVE — AB

## 2020-12-16 MED ORDER — MOLNUPIRAVIR EUA 200MG CAPSULE: 4.0000 | ORAL_CAPSULE | Freq: Two times a day (BID) | ORAL | 0 refills | Status: AC

## 2020-12-16 NOTE — Discharge Instructions (Addendum)
You have tested POSITIVE for COVID 19 today. Please pick up medication and take as prescribed.   You will need to self isolate for the next 5 days. Cleared: 11/10.   Continue taking Ibuprofen and Tylenol as needed for fevers and body aches. Drink plenty of fluids to stay hydrated.   Follow up with your PCP regarding ED visit  Return to the ED for any new/worsening symptoms

## 2020-12-16 NOTE — ED Triage Notes (Signed)
Fever and body aches starting yesterday. No fever at this time.  Took tylenol at 1100

## 2020-12-16 NOTE — ED Provider Notes (Signed)
Emergency Medicine Provider Triage Evaluation Note  Cindy Dixon , a 30 y.o. female  was evaluated in triage.  Pt complains of fever, body aches, headache that began yesterday. Her child and boyfriend have similar symptoms however they tested negative for COVID. She last took tylenol around 11 today. No cough, SOB, abdominal pain.  Review of Systems  Positive: + fever, body aches, HA Negative: - cough, SOB  Physical Exam  Temp 98.6 F (37 C) (Oral)   LMP  (LMP Unknown)  Gen:   Awake, no distress   Resp:  Normal effort  MSK:   Moves extremities without difficulty  Other:    Medical Decision Making  Medically screening exam initiated at 1:06 PM.  Appropriate orders placed.  Cindy Dixon was informed that the remainder of the evaluation will be completed by another provider, this initial triage assessment does not replace that evaluation, and the importance of remaining in the ED until their evaluation is complete.     Tanda Rockers, PA-C 12/16/20 1307    Rolan Bucco, MD 12/16/20 1320

## 2020-12-16 NOTE — ED Provider Notes (Signed)
[facility] EMERGENCY DEPARTMENT Provider Note   CSN: [id number] Arrival date & time: 12/16/20  1242     History Chief Complaint  Patient presents with   Fever    DA MICHELLE is a 30 y.o. female who presents to the ED today with complaint of fever that began yesterday. She also complains of body aches, headache, cough.  She reports that her family members are all sick however they tested negative for COVID.  She last took Tylenol around 11 AM today for her symptoms with good relief.  She denies any shortness of breath.  She otherwise is without complaints.  The history is provided by the patient and medical records.      Past Medical History:  Diagnosis Date   Anemia    Gastritis    GERD (gastroesophageal reflux disease)    Peptic ulcer disease     Patient Active Problem List   Diagnosis Date Noted   Migraines 04/02/2020   Prediabetes 10/12/2019   Vitamin D deficiency 10/12/2019   Abdominal pain, epigastric 08/03/2019   Hematemesis 04/20/2019   Encounter for other preprocedural examination 04/20/2019   Morbid obesity (HCC) 11/12/2018   Bilateral impacted cerumen 11/12/2018   Gastroesophageal reflux disease 11/12/2018    Past Surgical History:  Procedure Laterality Date   24 HOUR PH STUDY N/A 05/17/2020   Procedure: 24 HOUR PH STUDY;  Surgeon: Lemar Lofty., MD;  Location: [facility];  Service: Gastroenterology;  Laterality: N/A;   CESAREAN SECTION     x2   CHOLECYSTECTOMY     ESOPHAGEAL MANOMETRY N/A 05/17/2020   Procedure: ESOPHAGEAL MANOMETRY (EM);  Surgeon: Lemar Lofty., MD;  Location: [facility];  Service: Gastroenterology;  Laterality: N/A;   TUBAL LIGATION       OB History     Gravida  2   Para  2   Term      Preterm      AB      Living         SAB      IAB      Ectopic      Multiple      Live Births              Family History  Problem Relation Age of Onset   Hypertension Mother     Hypertension Father    Arrhythmia Father    Heart disease Father    High Cholesterol Father    Diabetes Maternal Grandfather    Diabetes Paternal Grandfather    Colon cancer Neg Hx    Esophageal cancer Neg Hx    Rectal cancer Neg Hx    Stomach cancer Neg Hx     Social History   Tobacco Use   Smoking status: Never   Smokeless tobacco: Never  Vaping Use   Vaping Use: Never used  Substance Use Topics   Alcohol use: Yes    Comment: occasionally   Drug use: No    Home Medications Prior to Admission medications   Medication Sig Start Date End Date Taking? Authorizing Provider  molnupiravir EUA (LAGEVRIO) 200 mg CAPS capsule Take 4 capsules (800 mg total) by mouth 2 (two) times daily for 5 days. 12/16/20 12/21/20 Yes Zuzanna Maroney, PA-C  buPROPion (WELLBUTRIN SR) 200 MG 12 hr tablet Take 1 tablet (200 mg total) by mouth daily. 11/16/20   Corinna Capra A, DO  Cholecalciferol 1.25 MG (50000 UT) TABS Take 50,000 Units by mouth once a  week. 11/16/20   Corinna Capra A, DO  Insulin Pen Needle (B-D UF III MINI PEN NEEDLES) 31G X 5 MM MISC USE WITH THE VICTOZA 10/18/20   Corinna Capra A, DO  liraglutide (VICTOZA) 18 MG/3ML SOPN 1.2 mg SQ once a day. 11/16/20   Corinna Capra A, DO  metFORMIN (GLUCOPHAGE) 1000 MG tablet Take 1 tablet (1,000 mg total) by mouth 2 (two) times daily with a meal. 11/16/20   Corinna Capra A, DO  ondansetron (ZOFRAN) 4 MG tablet Take 1 tablet (4 mg total) by mouth every 8 (eight) hours as needed for nausea or vomiting. 11/16/20   Corinna Capra A, DO  pantoprazole (PROTONIX) 40 MG tablet Take 1 tablet (40 mg total) by mouth daily. 10/18/20 01/16/21  Corinna Capra A, DO  sertraline (ZOLOFT) 100 MG tablet Take 1 tablet (100 mg total) by mouth daily. 11/16/20   Corinna Capra A, DO  SUMAtriptan (IMITREX) 50 MG tablet Take 1 tablet (50 mg total) by mouth as needed for migraine. May repeat in 2 hours if headache persists or recurs. Do not exceed 2 tablets in 24 hours. 11/16/20   Roswell Nickel, DO    Allergies    Patient has no known allergies.  Review of Systems   Review of Systems  Constitutional:  Positive for chills, fatigue and fever.  Respiratory:  Positive for cough.   Gastrointestinal:  Negative for abdominal pain, nausea and vomiting.  Musculoskeletal:  Positive for myalgias.  Neurological:  Positive for headaches.  All other systems reviewed and are negative.  Physical Exam Updated Vital Signs BP (!) 144/78 (BP Location: Right Arm)   Pulse (!) 108   Temp 98.6 F (37 C)   Resp 16   LMP  (LMP Unknown)   SpO2 99%   Physical Exam Vitals and nursing note reviewed.  Constitutional:      Appearance: She is not ill-appearing.  HENT:     Head: Normocephalic and atraumatic.  Eyes:     Conjunctiva/sclera: Conjunctivae normal.  Cardiovascular:     Rate and Rhythm: Normal rate and regular rhythm.     Pulses: Normal pulses.  Pulmonary:     Effort: Pulmonary effort is normal.     Breath sounds: Normal breath sounds. No wheezing, rhonchi or rales.  Skin:    General: Skin is warm and dry.     Coloration: Skin is not jaundiced.  Neurological:     Mental Status: She is alert.    ED Results / Procedures / Treatments   Labs (all labs ordered are listed, but only abnormal results are displayed) Labs Reviewed  RESP PANEL BY RT-PCR (FLU A&B, COVID) ARPGX2 - Abnormal; Notable for the following components:      Result Value   SARS Coronavirus 2 by RT PCR POSITIVE (*)    All other components within normal limits    EKG None  Radiology No results found.  Procedures Procedures   Medications Ordered in ED Medications - No data to display  ED Course  I have reviewed the triage vital signs and the nursing notes.  Pertinent labs & imaging results that were available during my care of the patient were reviewed by me and considered in my medical decision making (see chart for details).    MDM Rules/Calculators/A&P                           30 year old  Spanish-speaking female who presents to the ED  today with URI-like complaints.  Family members are at home sick.  On arrival to the ED she is afebrile.  She last took Tylenol prior to arrival.  Will swab for COVID and flu at this time and pending results.  COVID test has returned positive.  Patient is otherwise healthy.  She does have history of obesity.  I do feel she would benefit from antivirals.  Given the amount of medication she is on will prescribe on the peer of year to prevent any interactions.  Past history of tubal ligation.  Patient instructed on self isolation protocols and to follow-up with her PCP.  She is in agreement with plan at this time and stable for discharge.  This note was prepared using Dragon voice recognition software and may include unintentional dictation errors due to the inherent limitations of voice recognition software.  RHYA SHAN was evaluated in Emergency Department on 12/16/2020 for the symptoms described in the history of present illness. She was evaluated in the context of the global COVID-19 pandemic, which necessitated consideration that the patient might be at risk for infection with the SARS-CoV-2 virus that causes COVID-19. Institutional protocols and algorithms that pertain to the evaluation of patients at risk for COVID-19 are in a state of rapid change based on information released by regulatory bodies including the CDC and federal and state organizations. These policies and algorithms were followed during the patient's care in the ED.   Final Clinical Impression(s) / ED Diagnoses Final diagnoses:  COVID-19    Rx / DC Orders ED Discharge Orders          Ordered    molnupiravir EUA (LAGEVRIO) 200 mg CAPS capsule  2 times daily        12/16/20 1502             Discharge Instructions      You have tested POSITIVE for COVID 19 today. Please pick up medication and take as prescribed.   You will need to self isolate for the next 5  days. Cleared: 11/10.   Continue taking Ibuprofen and Tylenol as needed for fevers and body aches. Drink plenty of fluids to stay hydrated.   Follow up with your PCP regarding ED visit  Return to the ED for any new/worsening symptoms       Tanda Rockers, PA-C 12/16/20 1544    Rolan Bucco, MD 12/16/20 1759

## 2020-12-28 ENCOUNTER — Encounter (INDEPENDENT_AMBULATORY_CARE_PROVIDER_SITE_OTHER): Payer: Self-pay | Admitting: Bariatrics

## 2020-12-28 ENCOUNTER — Other Ambulatory Visit: Payer: Self-pay

## 2020-12-28 ENCOUNTER — Ambulatory Visit (INDEPENDENT_AMBULATORY_CARE_PROVIDER_SITE_OTHER): Payer: Medicaid Other | Admitting: Bariatrics

## 2020-12-28 VITALS — BP 123/84 | HR 83 | Temp 98.0°F | Ht 65.0 in | Wt 313.0 lb

## 2020-12-28 DIAGNOSIS — G43809 Other migraine, not intractable, without status migrainosus: Secondary | ICD-10-CM

## 2020-12-28 DIAGNOSIS — Z6841 Body Mass Index (BMI) 40.0 and over, adult: Secondary | ICD-10-CM

## 2020-12-28 DIAGNOSIS — F3289 Other specified depressive episodes: Secondary | ICD-10-CM | POA: Diagnosis not present

## 2020-12-28 DIAGNOSIS — K219 Gastro-esophageal reflux disease without esophagitis: Secondary | ICD-10-CM

## 2020-12-28 DIAGNOSIS — E559 Vitamin D deficiency, unspecified: Secondary | ICD-10-CM

## 2020-12-28 DIAGNOSIS — R7303 Prediabetes: Secondary | ICD-10-CM

## 2020-12-28 MED ORDER — ONDANSETRON HCL 4 MG PO TABS: 4.0000 mg | ORAL_TABLET | Freq: Three times a day (TID) | ORAL | 1 refills | Status: DC | PRN

## 2020-12-28 MED ORDER — PANTOPRAZOLE SODIUM 40 MG PO TBEC: 40.0000 mg | DELAYED_RELEASE_TABLET | Freq: Every day | ORAL | 0 refills | Status: DC

## 2020-12-28 MED ORDER — VICTOZA 18 MG/3ML ~~LOC~~ SOPN: PEN_INJECTOR | SUBCUTANEOUS | 0 refills | Status: DC

## 2020-12-28 MED ORDER — BD PEN NEEDLE MINI U/F 31G X 5 MM MISC: 0 refills | Status: DC

## 2020-12-28 MED ORDER — METFORMIN HCL 1000 MG PO TABS: 1000.0000 mg | ORAL_TABLET | Freq: Two times a day (BID) | ORAL | 0 refills | Status: DC

## 2020-12-28 MED ORDER — SUMATRIPTAN SUCCINATE 50 MG PO TABS: 50.0000 mg | ORAL_TABLET | ORAL | 0 refills | Status: DC | PRN

## 2020-12-28 MED ORDER — SERTRALINE HCL 100 MG PO TABS: 100.0000 mg | ORAL_TABLET | Freq: Every day | ORAL | 0 refills | Status: DC

## 2020-12-28 NOTE — Progress Notes (Signed)
Chief Complaint:   OBESITY Cindy Dixon is here to discuss her progress with her obesity treatment plan along with follow-up of her obesity related diagnoses. Interpreter Cindy Dixon is here with [name] during the visit. Cindy Dixon is on the Category 3 Plan and states she is following her eating plan approximately 3 lbs% of the time. Cindy Dixon states she is walking for 30 minutes 3 times per week.  Today's visit was #: 22 Starting weight: 321 lbs Starting date: 10/11/2019 Today's weight: 313 lbs Today's date: 12/28/2020 Total lbs lost to date: 8 lbs Total lbs lost since last in-office visit: 3 lbs  Interim History: Cindy Dixon is down an additional 3 lbs since her last visit.  Subjective:   1. Cindy Dixon is still having some hunger.  2. Vitamin D deficiency Cindy Dixon is taking her medications as directed.  3. Gastroesophageal reflux disease, unspecified whether esophagitis present Cindy Dixon's GERD is well control. She is currently taking Protonix.  4. Other migraine without status migrainosus, not intractable Cindy Dixon is taking Imitrex as needed for migraines.  5. Other depression, with emotional eating Cindy Dixon still notes some anxiety.  Assessment/Plan:   1. Cindy [name] will continue to work on weight loss, exercise, and decreasing simple carbohydrates to help decrease the risk of diabetes. We will refill Victoza 18 mg  with no refills. We will refill Metformin 1000 mg for 1 month with no refills. We will refill Insulin pen needles with no refills.  - metFORMIN (GLUCOPHAGE) 1000 MG tablet; Take 1 tablet (1,000 mg total) by mouth 2 (two) times daily with a meal.  Dispense: 60 tablet; Refill: 0 - liraglutide (VICTOZA) 18 MG/3ML SOPN; 1.2 mg SQ once a day.  Dispense: 9 mL; Refill: 0 - Insulin Pen Needle (B-D UF III MINI PEN NEEDLES) 31G X 5 MM MISC; USE WITH THE VICTOZA  Dispense: 100 each; Refill: 0  2. Vitamin D deficiency Low Vitamin D  level contributes to fatigue and are associated with obesity, breast, and colon cancer. We will refill prescription Vitamin D 50,000 IU every week for 1 month with no refills and Cindy Dixon will follow-up for routine testing of Vitamin D, at least 2-3 times per year to avoid over-replacement.  3. Gastroesophageal reflux disease, unspecified whether esophagitis present Intensive lifestyle modifications are the first line treatment for this issue. We discussed several lifestyle modifications today and she will continue to work on diet, exercise and weight loss efforts. We will refill Protonix 40 mg for 3 months with no refills.Orders and follow up as documented in patient record.   Counseling If a person has gastroesophageal reflux disease (GERD), food and stomach acid move back up into the esophagus and cause symptoms or problems such as damage to the esophagus. Anti-reflux measures include: raising the head of the bed, avoiding tight clothing or belts, avoiding eating late at night, not lying down shortly after mealtime, and achieving weight loss. Avoid ASA, NSAID's, caffeine, alcohol, and tobacco.  OTC Pepcid and/or Tums are often very helpful for as needed use.  However, for persisting chronic or daily symptoms, stronger medications like Omeprazole may be needed. You may need to avoid foods and drinks such as: Coffee and tea (with or without caffeine). Drinks that contain alcohol. Energy drinks and sports drinks. Bubbly (carbonated) drinks or sodas. Chocolate and cocoa. Peppermint and mint flavorings. Garlic and onions. Horseradish. Spicy and acidic foods. These include peppers, chili powder, curry powder, vinegar, hot sauces, and BBQ sauce. Citrus fruit juices and citrus fruits, such as oranges,  lemons, and limes. Tomato-based foods. These include red sauce, chili, salsa, and pizza with red sauce. Fried and fatty foods. These include donuts, french fries, potato chips, and high-fat  dressings. High-fat meats. These include hot dogs, rib eye steak, sausage, ham, and bacon.   - pantoprazole (PROTONIX) 40 MG tablet; Take 1 tablet (40 mg total) by mouth daily.  Dispense: 90 tablet; Refill: 0  4. Other migraine without status migrainosus, not intractable We will refill Imitrex 50 mg PRN with 1 refill. We will refill Zofran 4 mg every 8 hours as needed with no refills.  - SUMAtriptan (IMITREX) 50 MG tablet; Take 1 tablet (50 mg total) by mouth as needed for migraine. May repeat in 2 hours if headache persists or recurs. Do not exceed 2 tablets in 24 hours.  Dispense: 10 tablet; Refill: 0  - ondansetron (ZOFRAN) 4 MG tablet; Take 1 tablet (4 mg total) by mouth every 8 (eight) hours as needed for nausea or vomiting.  Dispense: 20 tablet; Refill: 1  5. Other depression, with emotional eating Behavior modification techniques were discussed today to help Cindy Dixon deal with her emotional/non-hunger eating behaviors.  We will refill Zoloft 100 mg for 1 month with no refills. Orders and follow up as documented in patient record.    - sertraline (ZOLOFT) 100 MG tablet; Take 1 tablet (100 mg total) by mouth daily.  Dispense: 30 tablet; Refill: 0  6. Class 3 severe obesity with serious comorbidity and body mass index (BMI) of 45.0 to 49.9 in adult, unspecified obesity type (HCC) Cindy Dixon is currently in the action stage of change. As such, her goal is to continue with weight loss efforts. She has agreed to the Category 3 Plan.   Cindy Dixon will continue meal planning and she will continue intentional eating. Handout on raw and cooked vegetables were given today. Strategies on the holidays were provided today.  Exercise goals:  As is.  Behavioral modification strategies: increasing lean protein intake, decreasing simple carbohydrates, increasing vegetables, increasing water intake, decreasing eating out, no skipping meals, meal planning and cooking strategies, keeping healthy foods in  the home, and planning for success.  Cindy Dixon has agreed to follow-up with our clinic in 5 weeks. She was informed of the importance of frequent follow-up visits to maximize her success with intensive lifestyle modifications for her multiple health conditions.   Objective:   Blood pressure 123/84, pulse 83, temperature 98 F (36.7 C), height 5\' 5"  (1.651 m), weight (!) 313 lb (142 kg), SpO2 97 %. Body mass index is 52.09 kg/m.  General: Cooperative, alert, well developed, in no acute distress. HEENT: Conjunctivae and lids unremarkable. Cardiovascular: Regular rhythm.  Lungs: Normal work of breathing. Neurologic: No focal deficits.   Lab Results  Component Value Date   CREATININE 0.64 04/05/2020   BUN 9 04/05/2020   NA 138 04/05/2020   K 3.7 04/05/2020   CL 104 04/05/2020   CO2 23 04/05/2020   Lab Results  Component Value Date   ALT 22 04/05/2020   AST 21 04/05/2020   ALKPHOS 58 04/05/2020   BILITOT 0.4 04/05/2020   Lab Results  Component Value Date   HGBA1C 5.8 (H) 04/17/2020   HGBA1C 6.0 (H) 10/11/2019   HGBA1C 5.7 (H) 11/12/2018   Lab Results  Component Value Date   INSULIN 25.8 (H) 04/17/2020   INSULIN 34.1 (H) 10/11/2019   Lab Results  Component Value Date   TSH 2.540 10/11/2019   Lab Results  Component Value Date  CHOL 172 10/11/2019   HDL 36 (L) 10/11/2019   LDLCALC 108 (H) 10/11/2019   TRIG 155 (H) 10/11/2019   CHOLHDL 4.0 11/12/2018   Lab Results  Component Value Date   VD25OH 26.3 (L) 10/18/2020   VD25OH 25.3 (L) 04/17/2020   VD25OH 18.6 (L) 10/11/2019   Lab Results  Component Value Date   WBC 10.9 (H) 04/05/2020   HGB 13.0 04/05/2020   HCT 39.1 04/05/2020   MCV 84.8 04/05/2020   PLT 322 04/05/2020   No results found for: IRON, TIBC, FERRITIN  Attestation Statements:   Reviewed by clinician on day of visit: allergies, medications, problem list, medical history, surgical history, family history, social history, and previous  encounter notes.  I, Jackson Latino, RMA, am acting as Energy manager for [employer], DO.   I have reviewed the above documentation for accuracy and completeness, and I agree with the above. Corinna Capra, DO

## 2021-02-01 ENCOUNTER — Ambulatory Visit (INDEPENDENT_AMBULATORY_CARE_PROVIDER_SITE_OTHER): Payer: Medicaid Other | Admitting: Bariatrics

## 2021-02-26 ENCOUNTER — Encounter (INDEPENDENT_AMBULATORY_CARE_PROVIDER_SITE_OTHER): Payer: Self-pay | Admitting: Bariatrics

## 2021-02-26 ENCOUNTER — Other Ambulatory Visit: Payer: Self-pay

## 2021-02-26 ENCOUNTER — Encounter (INDEPENDENT_AMBULATORY_CARE_PROVIDER_SITE_OTHER): Payer: Medicaid Other | Admitting: Bariatrics

## 2021-02-26 ENCOUNTER — Encounter (INDEPENDENT_AMBULATORY_CARE_PROVIDER_SITE_OTHER): Payer: Self-pay

## 2021-02-27 NOTE — Progress Notes (Signed)
This encounter was created in error - please disregard.

## 2021-03-05 ENCOUNTER — Encounter (INDEPENDENT_AMBULATORY_CARE_PROVIDER_SITE_OTHER): Payer: Self-pay | Admitting: Bariatrics

## 2021-03-05 ENCOUNTER — Ambulatory Visit (INDEPENDENT_AMBULATORY_CARE_PROVIDER_SITE_OTHER): Payer: Medicaid Other | Admitting: Bariatrics

## 2021-03-05 ENCOUNTER — Other Ambulatory Visit: Payer: Self-pay

## 2021-03-05 VITALS — BP 119/7 | HR 63 | Temp 97.8°F | Ht 65.0 in | Wt 311.0 lb

## 2021-03-05 DIAGNOSIS — F3289 Other specified depressive episodes: Secondary | ICD-10-CM

## 2021-03-05 DIAGNOSIS — F5089 Other specified eating disorder: Secondary | ICD-10-CM

## 2021-03-05 DIAGNOSIS — G43809 Other migraine, not intractable, without status migrainosus: Secondary | ICD-10-CM | POA: Diagnosis not present

## 2021-03-05 DIAGNOSIS — K219 Gastro-esophageal reflux disease without esophagitis: Secondary | ICD-10-CM | POA: Diagnosis not present

## 2021-03-05 DIAGNOSIS — R7303 Prediabetes: Secondary | ICD-10-CM

## 2021-03-05 DIAGNOSIS — Z6841 Body Mass Index (BMI) 40.0 and over, adult: Secondary | ICD-10-CM

## 2021-03-05 DIAGNOSIS — E559 Vitamin D deficiency, unspecified: Secondary | ICD-10-CM | POA: Diagnosis not present

## 2021-03-05 MED ORDER — PANTOPRAZOLE SODIUM 40 MG PO TBEC: 40.0000 mg | DELAYED_RELEASE_TABLET | Freq: Every day | ORAL | 0 refills | Status: DC

## 2021-03-05 MED ORDER — METFORMIN HCL 1000 MG PO TABS: 1000.0000 mg | ORAL_TABLET | Freq: Two times a day (BID) | ORAL | 0 refills | Status: DC

## 2021-03-05 MED ORDER — CHOLECALCIFEROL 1.25 MG (50000 UT) PO TABS: 50000.0000 [IU] | ORAL_TABLET | ORAL | 0 refills | Status: DC

## 2021-03-05 MED ORDER — BUPROPION HCL ER (SR) 200 MG PO TB12: 200.0000 mg | ORAL_TABLET | Freq: Every day | ORAL | 0 refills | Status: DC

## 2021-03-05 MED ORDER — SERTRALINE HCL 100 MG PO TABS: 100.0000 mg | ORAL_TABLET | Freq: Every day | ORAL | 0 refills | Status: DC

## 2021-03-05 MED ORDER — ONDANSETRON HCL 4 MG PO TABS: 4.0000 mg | ORAL_TABLET | Freq: Three times a day (TID) | ORAL | 1 refills | Status: DC | PRN

## 2021-03-05 MED ORDER — SUMATRIPTAN SUCCINATE 50 MG PO TABS: 50.0000 mg | ORAL_TABLET | ORAL | 0 refills | Status: DC | PRN

## 2021-03-05 NOTE — Progress Notes (Signed)
Chief Complaint:   OBESITY [name] is here to discuss her progress with her obesity treatment plan along with follow-up of her obesity related diagnoses. [name] is on the Category 3 Plan and states she is following her eating plan approximately 50% of the time. [name] states she is walking for 30 minutes 7 times per week.  Today's visit was #: 23 Starting weight: 321 lbs Starting date: 10/11/2019 Today's weight: 311 lbs Today's date: 03/05/2021 Total lbs lost to date: 10 lbs Total lbs lost since last in-office visit: 2 lbs  Interim History: [name] is down an additional 2 lbs and doing better overall. She has stopped all sodas and juice.   Subjective:   1. Pre-diabetes [name] is currently taking Metformin.  2. Other migraine without status migrainosus, not intractable Cindy DukeFrancesca is taking Imitrex as needed.   3. Gastroesophageal reflux disease, unspecified whether esophagitis present [name] is taking Protonix currently.   4. Vitamin D deficiency [name] is taking her medications as directed.  5. Other disorder of eating [name] is taking Zoloft 100 mg and Wellbutrin SR 200 mg.   6. Other depression, with emotional eating [name] is struggling with emotional eating and using food for comfort to the extent that it is negatively impacting her health. She has been working on behavior modification techniques to help reduce her emotional eating. She shows no sign of suicidal or homicidal ideations.   Assessment/Plan:   1. Pre-diabetes We will refill Metformin 1000 mg for 1 month with no refills. [name] will continue to work on weight loss, exercise, and decreasing simple carbohydrates to help decrease the risk of diabetes.   - metFORMIN (GLUCOPHAGE) 1000 MG tablet; Take 1 tablet (1,000 mg total) by mouth 2 (two) times daily with a meal.  Dispense: 180 tablet; Refill: 0  2. Other migraine without status migrainosus, not intractable We will  refill Imitrex 50 mg for 1 month with no refills. We will refill Zofran 4 mg for 1 month with no refills.   - ondansetron (ZOFRAN) 4 MG tablet; Take 1 tablet (4 mg total) by mouth every 8 (eight) hours as needed for nausea or vomiting.  Dispense: 30 tablet; Refill: 1  - SUMAtriptan (IMITREX) 50 MG tablet; Take 1 tablet (50 mg total) by mouth as needed for migraine. May repeat in 2 hours if headache persists or recurs. Do not exceed 2 tablets in 24 hours.  Dispense: 30 tablet; Refill: 0  3. Gastroesophageal reflux disease, unspecified whether esophagitis present [name] will cut back on some of her seasonings. She will continue Protonix. We will refill Protonix 40 mg for 3 months with no refills. Intensive lifestyle modifications are the first line treatment for this issue. We discussed several lifestyle modifications today and she will continue to work on diet, exercise and weight loss efforts. Orders and follow up as documented in patient record.   Counseling If a person has gastroesophageal reflux disease (GERD), food and stomach acid move back up into the esophagus and cause symptoms or problems such as damage to the esophagus. Anti-reflux measures include: raising the head of the bed, avoiding tight clothing or belts, avoiding eating late at night, not lying down shortly after mealtime, and achieving weight loss. Avoid ASA, NSAID's, caffeine, alcohol, and tobacco.  OTC Pepcid and/or Tums are often very helpful for as needed use.  However, for persisting chronic or daily symptoms, stronger medications like Omeprazole may be needed. You may need to avoid foods and drinks such as: Coffee and tea (  with or without caffeine). Drinks that contain alcohol. Energy drinks and sports drinks. Bubbly (carbonated) drinks or sodas. Chocolate and cocoa. Peppermint and mint flavorings. Garlic and onions. Horseradish. Spicy and acidic foods. These include peppers, chili powder, curry powder, vinegar,  hot sauces, and BBQ sauce. Citrus fruit juices and citrus fruits, such as oranges, lemons, and limes. Tomato-based foods. These include red sauce, chili, salsa, and pizza with red sauce. Fried and fatty foods. These include donuts, french fries, potato chips, and high-fat dressings. High-fat meats. These include hot dogs, rib eye steak, sausage, ham, and bacon.   - pantoprazole (PROTONIX) 40 MG tablet; Take 1 tablet (40 mg total) by mouth daily.  Dispense: 90 tablet; Refill: 0  4. Vitamin D deficiency Low Vitamin D level contributes to fatigue and are associated with obesity, breast, and colon cancer.Cindy Dixon agrees to continue taking prescription Vitamin D 50,000 IU every week and she will follow-up for routine testing of Vitamin D, at least 2-3 times per year to avoid over-replacement.  - Cholecalciferol 1.25 MG (50000 UT) TABS; Take 50,000 Units by mouth once a week.  Dispense: 12 tablet; Refill: 0  5. Other disorder of eating We will refill Zoloft 100 mg for 1 month with no refills. We will refill Wellbutrin SR 200 mg for 1 month with no refills. Behavior modification techniques were discussed today to help Cindy Dixon deal with her emotional/non-hunger eating behaviors.  Orders and follow up as documented in patient record.    - buPROPion (WELLBUTRIN SR) 200 MG 12 hr tablet; Take 1 tablet (200 mg total) by mouth daily.  Dispense: 90 tablet; Refill: 0  - sertraline (ZOLOFT) 100 MG tablet; Take 1 tablet (100 mg total) by mouth daily.  Dispense: 90 tablet; Refill: 0  6. Other depression, with emotional eating  Behavior modification techniques were discussed today to help Cindy Dixon deal with her emotional/non-hunger eating behaviors.  Orders and follow up as documented in patient record.    7. Obesity, current BMI 51.9 Ria is currently in the action stage of change. As such, her goal is to continue with weight loss efforts. She has agreed to the Category 3 Plan.   Layce will  continue meal planning and she will continue intentional eating. She will have no juice and sodas. She will cut back on carbohydrates sweets.  Exercise goals:  As is.  Behavioral modification strategies: increasing lean protein intake, decreasing simple carbohydrates, increasing vegetables, increasing water intake, decreasing eating out, no skipping meals, meal planning and cooking strategies, keeping healthy foods in the home, and planning for success.  Nargis has agreed to follow-up with our clinic in 4 weeks. She was informed of the importance of frequent follow-up visits to maximize her success with intensive lifestyle modifications for her multiple health conditions.   Objective:   Blood pressure (!) 119/7, pulse 63, temperature 97.8 F (36.6 C), height 5\' 5"  (1.651 m), weight (!) 311 lb (141.1 kg), SpO2 98 %. Body mass index is 51.75 kg/m.  General: Cooperative, alert, well developed, in no acute distress. HEENT: Conjunctivae and lids unremarkable. Cardiovascular: Regular rhythm.  Lungs: Normal work of breathing. Neurologic: No focal deficits.   Lab Results  Component Value Date   CREATININE 0.64 04/05/2020   BUN 9 04/05/2020   NA 138 04/05/2020   K 3.7 04/05/2020   CL 104 04/05/2020   CO2 23 04/05/2020   Lab Results  Component Value Date   ALT 22 04/05/2020   AST 21 04/05/2020   ALKPHOS 58 04/05/2020  BILITOT 0.4 04/05/2020   Lab Results  Component Value Date   HGBA1C 5.8 (H) 04/17/2020   HGBA1C 6.0 (H) 10/11/2019   HGBA1C 5.7 (H) 11/12/2018   Lab Results  Component Value Date   INSULIN 25.8 (H) 04/17/2020   INSULIN 34.1 (H) 10/11/2019   Lab Results  Component Value Date   TSH 2.540 10/11/2019   Lab Results  Component Value Date   CHOL 172 10/11/2019   HDL 36 (L) 10/11/2019   LDLCALC 108 (H) 10/11/2019   TRIG 155 (H) 10/11/2019   CHOLHDL 4.0 11/12/2018   Lab Results  Component Value Date   VD25OH 26.3 (L) 10/18/2020   VD25OH 25.3 (L)  04/17/2020   VD25OH 18.6 (L) 10/11/2019   Lab Results  Component Value Date   WBC 10.9 (H) 04/05/2020   HGB 13.0 04/05/2020   HCT 39.1 04/05/2020   MCV 84.8 04/05/2020   PLT 322 04/05/2020   No results found for: IRON, TIBC, FERRITIN  Attestation Statements:   Reviewed by clinician on day of visit: allergies, medications, problem list, medical history, surgical history, family history, social history, and previous encounter notes.  I, Jackson Latino, RMA, am acting as Energy manager for [employer], DO.  I have reviewed the above documentation for accuracy and completeness, and I agree with the above. Corinna Capra, DO

## 2021-03-06 ENCOUNTER — Encounter (INDEPENDENT_AMBULATORY_CARE_PROVIDER_SITE_OTHER): Payer: Self-pay | Admitting: Bariatrics

## 2021-04-04 ENCOUNTER — Ambulatory Visit (INDEPENDENT_AMBULATORY_CARE_PROVIDER_SITE_OTHER): Payer: Medicaid Other | Admitting: Bariatrics

## 2021-04-04 ENCOUNTER — Encounter (INDEPENDENT_AMBULATORY_CARE_PROVIDER_SITE_OTHER): Payer: Self-pay | Admitting: Bariatrics

## 2021-04-04 ENCOUNTER — Other Ambulatory Visit: Payer: Self-pay

## 2021-04-04 VITALS — BP 121/78 | HR 80 | Temp 98.0°F | Ht 65.0 in | Wt 301.0 lb

## 2021-04-04 DIAGNOSIS — Z6841 Body Mass Index (BMI) 40.0 and over, adult: Secondary | ICD-10-CM

## 2021-04-04 DIAGNOSIS — G43809 Other migraine, not intractable, without status migrainosus: Secondary | ICD-10-CM | POA: Diagnosis not present

## 2021-04-04 DIAGNOSIS — F3289 Other specified depressive episodes: Secondary | ICD-10-CM

## 2021-04-04 DIAGNOSIS — E559 Vitamin D deficiency, unspecified: Secondary | ICD-10-CM | POA: Diagnosis not present

## 2021-04-04 DIAGNOSIS — E669 Obesity, unspecified: Secondary | ICD-10-CM

## 2021-04-04 DIAGNOSIS — R7303 Prediabetes: Secondary | ICD-10-CM

## 2021-04-04 DIAGNOSIS — K219 Gastro-esophageal reflux disease without esophagitis: Secondary | ICD-10-CM | POA: Diagnosis not present

## 2021-04-04 MED ORDER — ONDANSETRON HCL 4 MG PO TABS: 4.0000 mg | ORAL_TABLET | Freq: Three times a day (TID) | ORAL | 1 refills | Status: DC | PRN

## 2021-04-04 MED ORDER — BUPROPION HCL ER (SR) 200 MG PO TB12: 200.0000 mg | ORAL_TABLET | Freq: Every day | ORAL | 0 refills | Status: DC

## 2021-04-04 MED ORDER — CHOLECALCIFEROL 1.25 MG (50000 UT) PO TABS: 50000.0000 [IU] | ORAL_TABLET | ORAL | 0 refills | Status: DC

## 2021-04-04 MED ORDER — PANTOPRAZOLE SODIUM 40 MG PO TBEC: 40.0000 mg | DELAYED_RELEASE_TABLET | Freq: Every day | ORAL | 0 refills | Status: DC

## 2021-04-04 MED ORDER — SERTRALINE HCL 100 MG PO TABS: 100.0000 mg | ORAL_TABLET | Freq: Every day | ORAL | 0 refills | Status: DC

## 2021-04-04 MED ORDER — METFORMIN HCL 1000 MG PO TABS: 1000.0000 mg | ORAL_TABLET | Freq: Two times a day (BID) | ORAL | 0 refills | Status: DC

## 2021-04-04 NOTE — Progress Notes (Signed)
Chief Complaint:   OBESITY Cindy Dixon is here to discuss her progress with her obesity treatment plan along with follow-up of her obesity related diagnoses. Cindy Dixon is on the Category 3 Plan and states she is following her eating plan approximately 70% of the time. Cindy Dixon states she is walking for 30 minutes 7 times per week.  Today's visit was #: 23 Starting weight: 321 lbs Starting date: 10/11/2019 Today's weight: 301 lbs Today's date: 04/04/2021 Total lbs lost to date: 20 Total lbs lost since last in-office visit: 10  Interim History: Cindy Dixon is down 10 lbs since her last visit. She stopped high carbohydrate food.   Subjective:   1. Vitamin D deficiency Cindy Dixon is taking Vit D without problems.  2. Pre-diabetes Cindy Dixon is taking metformin.   3. Other migraine without status migrainosus, not intractable Cindy Dixon is taking Zofran as directed.  4. Gastroesophageal reflux disease, unspecified whether esophagitis present Cindy Dixon is taking Protonix as directed.  5. Other depression, with emotional eating Cindy Dixon is taking Zoloft and Wellbutrin. She denies suicidal ideations.   Assessment/Plan:   1. Vitamin D deficiency We will refill prescription Vitamin D for 90 days with no refills. Cindy Dixon will follow-up for routine testing of Vitamin D, at least 2-3 times per year to avoid over-replacement.  - Cholecalciferol 1.25 MG (50000 UT) TABS; Take 50,000 Units by mouth once a week.  Dispense: 12 tablet; Refill: 0  2. Pre-diabetes We will refill metformin for 90 days with no refills. Cindy Dixon will continue to work on weight loss, exercise, and decreasing simple carbohydrates to help decrease the risk of diabetes.   - metFORMIN (GLUCOPHAGE) 1000 MG tablet; Take 1 tablet (1,000 mg total) by mouth 2 (two) times daily with a meal.  Dispense: 180 tablet; Refill: 0  3. Other migraine without status migrainosus, not intractable Cindy Dixon will continue  Zofran as needed. We will refill Zofran for 2 months.   - ondansetron (ZOFRAN) 4 MG tablet; Take 1 tablet (4 mg total) by mouth every 8 (eight) hours as needed for nausea or vomiting.  Dispense: 30 tablet; Refill: 1  4. Gastroesophageal reflux disease, unspecified whether esophagitis present Intensive lifestyle modifications are the first line treatment for this issue. We discussed several lifestyle modifications today. We will refill Protonix for 90 days with no refills. Cindy Dixon will continue to work on diet, exercise and weight loss efforts. Orders and follow up as documented in patient record.   Counseling If a person has gastroesophageal reflux disease (GERD), food and stomach acid move back up into the esophagus and cause symptoms or problems such as damage to the esophagus. Anti-reflux measures include: raising the head of the bed, avoiding tight clothing or belts, avoiding eating late at night, not lying down shortly after mealtime, and achieving weight loss. Avoid ASA, NSAID's, caffeine, alcohol, and tobacco.  OTC Pepcid and/or Tums are often very helpful for as needed use.  However, for persisting chronic or daily symptoms, stronger medications like Omeprazole may be needed. You may need to avoid foods and drinks such as: Coffee and tea (with or without caffeine). Drinks that contain alcohol. Energy drinks and sports drinks. Bubbly (carbonated) drinks or sodas. Chocolate and cocoa. Peppermint and mint flavorings. Garlic and onions. Horseradish. Spicy and acidic foods. These include peppers, chili powder, curry powder, vinegar, hot sauces, and BBQ sauce. Citrus fruit juices and citrus fruits, such as oranges, lemons, and limes. Tomato-based foods. These include red sauce, chili, salsa, and pizza with red sauce. Fried and fatty  foods. These include donuts, french fries, potato chips, and high-fat dressings. High-fat meats. These include hot dogs, rib eye steak, sausage, ham, and  bacon.  - pantoprazole (PROTONIX) 40 MG tablet; Take 1 tablet (40 mg total) by mouth daily.  Dispense: 90 tablet; Refill: 0  5. Other depression, with emotional eating Behavior modification techniques were discussed today to help Cindy Dixon deal with her emotional/non-hunger eating behaviors. We will refill Zoloft and Wellbutrin SR for 90 days with no refills. Orders and follow up as documented in patient record.   - buPROPion (WELLBUTRIN SR) 200 MG 12 hr tablet; Take 1 tablet (200 mg total) by mouth daily.  Dispense: 90 tablet; Refill: 0 - sertraline (ZOLOFT) 100 MG tablet; Take 1 tablet (100 mg total) by mouth daily.  Dispense: 90 tablet; Refill: 0  6. Obesity, with current BMI of 50.1 Cindy Dixon is currently in the action stage of change. As such, her goal is to continue with weight loss efforts. She has agreed to the Category 3 Plan.   Will adhere closely to her plan 80-90 %. Keep carbohydrates low. Keep water high.  Exercise goals: As is.  Behavioral modification strategies: increasing lean protein intake, decreasing simple carbohydrates, increasing vegetables, increasing water intake, decreasing eating out, no skipping meals, meal planning and cooking strategies, keeping healthy foods in the home, and planning for success.  Cindy Dixon has agreed to follow-up with our clinic in 4 weeks. She was informed of the importance of frequent follow-up visits to maximize her success with intensive lifestyle modifications for her multiple health conditions.   Objective:   Blood pressure 121/78, pulse 80, temperature 98 F (36.7 C), height 5\' 5"  ([id number] m), weight (!) 301 lb (136.5 kg), SpO2 97 %. Body mass index is 50.09 kg/m.  General: Cooperative, alert, well developed, in no acute distress. HEENT: Conjunctivae and lids unremarkable. Cardiovascular: Regular rhythm.  Lungs: Normal work of breathing. Neurologic: No focal deficits.   Lab Results  Component Value Date   CREATININE 0.64  04/05/2020   BUN 9 04/05/2020   NA 138 04/05/2020   K 3.7 04/05/2020   CL 104 04/05/2020   CO2 23 04/05/2020   Lab Results  Component Value Date   ALT 22 04/05/2020   AST 21 04/05/2020   ALKPHOS 58 04/05/2020   BILITOT 0.4 04/05/2020   Lab Results  Component Value Date   HGBA1C 5.8 (H) 04/17/2020   HGBA1C 6.0 (H) 10/11/2019   HGBA1C 5.7 (H) 11/12/2018   Lab Results  Component Value Date   INSULIN 25.8 (H) 04/17/2020   INSULIN 34.1 (H) 10/11/2019   Lab Results  Component Value Date   TSH 2.540 10/11/2019   Lab Results  Component Value Date   CHOL 172 10/11/2019   HDL 36 (L) 10/11/2019   LDLCALC 108 (H) 10/11/2019   TRIG 155 (H) 10/11/2019   CHOLHDL 4.0 11/12/2018   Lab Results  Component Value Date   VD25OH 26.3 (L) 10/18/2020   VD25OH 25.3 (L) 04/17/2020   VD25OH 18.6 (L) 10/11/2019   Lab Results  Component Value Date   WBC 10.9 (H) 04/05/2020   HGB 13.0 04/05/2020   HCT 39.1 04/05/2020   MCV 84.8 04/05/2020   PLT 322 04/05/2020   No results found for: IRON, TIBC, FERRITIN  Attestation Statements:   Reviewed by clinician on day of visit: allergies, medications, problem list, medical history, surgical history, family history, social history, and previous encounter notes.   Wilhemena Durie, am acting as Location manager for [employer]  Owens Shark, DO.  I have reviewed the above documentation for accuracy and completeness, and I agree with the above. Jearld Lesch, DO

## 2021-04-05 ENCOUNTER — Encounter (INDEPENDENT_AMBULATORY_CARE_PROVIDER_SITE_OTHER): Payer: Self-pay | Admitting: Bariatrics

## 2021-04-11 ENCOUNTER — Ambulatory Visit (INDEPENDENT_AMBULATORY_CARE_PROVIDER_SITE_OTHER): Payer: Medicaid Other | Admitting: Bariatrics

## 2021-05-02 ENCOUNTER — Ambulatory Visit (INDEPENDENT_AMBULATORY_CARE_PROVIDER_SITE_OTHER): Payer: Medicaid Other | Admitting: Bariatrics

## 2021-05-08 ENCOUNTER — Ambulatory Visit (INDEPENDENT_AMBULATORY_CARE_PROVIDER_SITE_OTHER): Payer: Medicaid Other | Admitting: Bariatrics

## 2021-05-09 ENCOUNTER — Ambulatory Visit (INDEPENDENT_AMBULATORY_CARE_PROVIDER_SITE_OTHER): Payer: Medicaid Other | Admitting: Adult Health

## 2021-05-10 ENCOUNTER — Ambulatory Visit (INDEPENDENT_AMBULATORY_CARE_PROVIDER_SITE_OTHER): Payer: Medicaid Other | Admitting: Family Medicine

## 2021-06-01 ENCOUNTER — Ambulatory Visit: Payer: Self-pay | Admitting: Family

## 2021-06-07 ENCOUNTER — Encounter (INDEPENDENT_AMBULATORY_CARE_PROVIDER_SITE_OTHER): Payer: Self-pay | Admitting: Adult Health

## 2021-06-07 ENCOUNTER — Ambulatory Visit (INDEPENDENT_AMBULATORY_CARE_PROVIDER_SITE_OTHER): Payer: Medicaid Other | Admitting: Adult Health

## 2021-06-07 VITALS — BP 114/75 | HR 72 | Temp 98.0°F | Ht 65.0 in | Wt 288.0 lb

## 2021-06-07 DIAGNOSIS — F3289 Other specified depressive episodes: Secondary | ICD-10-CM | POA: Diagnosis not present

## 2021-06-07 DIAGNOSIS — E559 Vitamin D deficiency, unspecified: Secondary | ICD-10-CM

## 2021-06-07 DIAGNOSIS — G43809 Other migraine, not intractable, without status migrainosus: Secondary | ICD-10-CM

## 2021-06-07 DIAGNOSIS — K219 Gastro-esophageal reflux disease without esophagitis: Secondary | ICD-10-CM

## 2021-06-07 DIAGNOSIS — E669 Obesity, unspecified: Secondary | ICD-10-CM

## 2021-06-07 DIAGNOSIS — R7303 Prediabetes: Secondary | ICD-10-CM | POA: Diagnosis not present

## 2021-06-07 DIAGNOSIS — Z6841 Body Mass Index (BMI) 40.0 and over, adult: Secondary | ICD-10-CM | POA: Diagnosis not present

## 2021-06-07 MED ORDER — SUMATRIPTAN SUCCINATE 50 MG PO TABS: 50.0000 mg | ORAL_TABLET | ORAL | 0 refills | Status: DC | PRN

## 2021-06-07 MED ORDER — PANTOPRAZOLE SODIUM 40 MG PO TBEC: 40.0000 mg | DELAYED_RELEASE_TABLET | Freq: Every day | ORAL | 0 refills | Status: DC

## 2021-06-07 MED ORDER — BUPROPION HCL ER (SR) 200 MG PO TB12: 200.0000 mg | ORAL_TABLET | Freq: Every day | ORAL | 0 refills | Status: DC

## 2021-06-07 MED ORDER — METFORMIN HCL 1000 MG PO TABS: 1000.0000 mg | ORAL_TABLET | Freq: Two times a day (BID) | ORAL | 0 refills | Status: DC

## 2021-06-07 MED ORDER — SERTRALINE HCL 100 MG PO TABS: 100.0000 mg | ORAL_TABLET | Freq: Every day | ORAL | 0 refills | Status: DC

## 2021-06-08 LAB — VITAMIN D 25 HYDROXY (VIT D DEFICIENCY, FRACTURES): Vit D, 25-Hydroxy: 25.2 ng/mL — ABNORMAL LOW (ref 30.0–100.0)

## 2021-06-11 ENCOUNTER — Other Ambulatory Visit (INDEPENDENT_AMBULATORY_CARE_PROVIDER_SITE_OTHER): Payer: Self-pay | Admitting: Adult Health

## 2021-06-11 DIAGNOSIS — E559 Vitamin D deficiency, unspecified: Secondary | ICD-10-CM

## 2021-06-11 MED ORDER — CHOLECALCIFEROL 1.25 MG (50000 UT) PO TABS: 50000.0000 [IU] | ORAL_TABLET | ORAL | 0 refills | Status: DC

## 2021-06-13 ENCOUNTER — Ambulatory Visit: Payer: Self-pay | Admitting: *Deleted
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
## 2021-06-15 NOTE — Telephone Encounter (Signed)
Interpreter assistance provided by [employer], [phone] ? ? ?Virtual appt scheduled at [phone] with PCP. ? ? ?

## 2021-06-18 ENCOUNTER — Telehealth: Payer: Medicaid Other | Admitting: Internal Medicine

## 2021-06-19 NOTE — Progress Notes (Signed)
? ? ? ?Chief Complaint:  ? ?OBESITY ?Cindy Dixon is here to discuss her progress with her obesity treatment plan along with follow-up of her obesity related diagnoses. Cindy Dixon is on the Category 3 Plan and states she is following her eating plan approximately 50% of the time. Cindy Dixon states she is walking for 60 minutes 6 times per week. ? ?Today's visit was #: 24 ?Starting weight: 321 lbs ?Starting date: 10/11/2019 ?Today's weight: 288 lbs ?Today's date: 06/07/2021 ?Total lbs lost to date: 33 lbs ?Total lbs lost since last in-office visit: 13 lbs ? ?Interim History:  ?Cindy Dixon will be traveling to [place] on May 11-22, 2023. ?She request Xanax for air travel request from her primary care physician.  ? ?Subjective:  ? ?1. Gastroesophageal reflux disease, unspecified whether esophagitis present ?Merl notes trigger foods salad, juice, and coffee. ?She is followed by GI/Dr.Mansouraty- underwent Esophageal Manomerty. ? ?2. Pre-diabetes ?Cindy Dixon is currently on Metformin 1000 mg twice daily with meals and she is tolerating it well.  ? ?3. Other migraine without status migrainosus, not intractable ?Greenlee estimates to have daily migraines.  ?She estimates to use Imitrex 50 mg 2-3 times per week.  ?She never established with Dr. Ahern/Neurology after referral placed 06/2020.  ? ?4. Vitamin D deficiency ?Cindy Dixon's last Vitamin D level check was September 7,2022- 26.3- well below goal of 50-70. ?She is on Ergocalciferol- denies nausea, vomiting, and  muscle weakness. ? ?5. Other depression, with emotional eating ?Cindy Dixon reports stable mood.  ?She denies SI/HI. ? ?Assessment/Plan:  ? ?1. Gastroesophageal reflux disease, unspecified whether esophagitis present ?Intensive lifestyle modifications are the first line treatment for this issue. We will refill  Protonix 40 mg every day for 3 months with no refills. We discussed several lifestyle modifications today and she will continue to work on diet,  exercise and weight loss efforts. Orders and follow up as documented in patient record.  ? ?Counseling ?If a person has gastroesophageal reflux disease (GERD), food and stomach acid move back up into the esophagus and cause symptoms or problems such as damage to the esophagus. ?Anti-reflux measures include: raising the head of the bed, avoiding tight clothing or belts, avoiding eating late at night, not lying down shortly after mealtime, and achieving weight loss. ?Avoid ASA, NSAID's, caffeine, alcohol, and tobacco.  ?OTC Pepcid and/or Tums are often very helpful for as needed use.  ?However, for persisting chronic or daily symptoms, stronger medications like Omeprazole may be needed. ?You may need to avoid foods and drinks such as: ?Coffee and tea (with or without caffeine). ?Drinks that contain alcohol. ?Energy drinks and sports drinks. ?Bubbly (carbonated) drinks or sodas. ?Chocolate and cocoa. ?Peppermint and mint flavorings. ?Garlic and onions. ?Horseradish. ?Spicy and acidic foods. These include peppers, chili powder, curry powder, vinegar, hot sauces, and BBQ sauce. ?Citrus fruit juices and citrus fruits, such as oranges, lemons, and limes. ?Tomato-based foods. These include red sauce, chili, salsa, and pizza with red sauce. ?Fried and fatty foods. These include donuts, french fries, potato chips, and high-fat dressings. ?High-fat meats. These include hot dogs, rib eye steak, sausage, ham, and bacon.  ? ?- pantoprazole (PROTONIX) 40 MG tablet; Take 1 tablet (40 mg total) by mouth daily.  Dispense: 90 tablet; Refill: 0 ? ?2. Pre-diabetes ?We will refill Metformin 1000 mg BID for 3 months with no refills. Good blood sugar control is important to decrease the likelihood of diabetic complications such as nephropathy, neuropathy, limb loss, blindness, coronary artery disease, and death. Intensive lifestyle modification including  diet, exercise and weight loss are the first line of treatment for diabetes.  ? ?-  metFORMIN (GLUCOPHAGE) 1000 MG tablet; Take 1 tablet (1,000 mg total) by mouth 2 (two) times daily with a meal.  Dispense: 180 tablet; Refill: 0 ? ?3. Other migraine without status migrainosus, not intractable ?We will refill Imitrex 50 mg for 1 month with no refills.  ?Neurology referral placed again. ? ?- SUMAtriptan (IMITREX) 50 MG tablet; Take 1 tablet (50 mg total) by mouth as needed for migraine. May repeat in 2 hours if headache persists or recurs. Do not exceed 2 tablets in 24 hours.  Dispense: 30 tablet; Refill: 0 ? ?- Ambulatory referral to Neurology ? ?4. Vitamin D deficiency ?Low Vitamin D level contributes to fatigue and are associated with obesity, breast, and colon cancer. We will check labs today. We will refill prescription Vitamin D 50,000 IU at next appointment and Cindy Dixon will follow-up for routine testing of Vitamin D, at least 2-3 times per year to avoid over-replacement. ? ?- VITAMIN D 25 Hydroxy (Vit-D Deficiency, Fractures) ? ?5. Other depression, with emotional eating ?We will refill Wellbutrin SR 200 mg every day for 1 month with no refills. We will refill Zoloft 100 mg every for 3 months with no refills. Behavior modification techniques were discussed today to help Cindy Dixon deal with her emotional/non-hunger eating behaviors.  Orders and follow up as documented in patient record.  ? ?- buPROPion (WELLBUTRIN SR) 200 MG 12 hr tablet; Take 1 tablet (200 mg total) by mouth daily.  Dispense: 90 tablet; Refill: 0 ?- sertraline (ZOLOFT) 100 MG tablet; Take 1 tablet (100 mg total) by mouth daily.  Dispense: 90 tablet; Refill: 0 ? ?6. Obesity, with current BMI of 48.1 ?Cindy Dixon is currently in the action stage of change. As such, her goal is to continue with weight loss efforts. She has agreed to the Category 3 Plan.  ? ?We will check further at next office visit.  ? ?Exercise goals:  As is.  ? ?Behavioral modification strategies: increasing lean protein intake, decreasing simple  carbohydrates, meal planning and cooking strategies, keeping healthy foods in the home, and planning for success. ? ?Cindy Dixon has agreed to follow-up with our clinic in 4 weeks (fasting). She was informed of the importance of frequent follow-up visits to maximize her success with intensive lifestyle modifications for her multiple health conditions.  ? ?Objective:  ? ?Blood pressure 114/75, pulse 72, temperature 98 ?F (36.7 ?C), height 5\' 5"  (1.651 m), weight 288 lb (130.6 kg), SpO2 97 %. ?Body mass index is 47.93 kg/m?. ? ?General: Cooperative, alert, well developed, in no acute distress. ?HEENT: Conjunctivae and lids unremarkable. ?Cardiovascular: Regular rhythm.  ?Lungs: Normal work of breathing. ?Neurologic: No focal deficits.  ? ?Lab Results  ?Component Value Date  ? CREATININE 0.64 04/05/2020  ? BUN 9 04/05/2020  ? NA 138 04/05/2020  ? K 3.7 04/05/2020  ? CL 104 04/05/2020  ? CO2 23 04/05/2020  ? ?Lab Results  ?Component Value Date  ? ALT 22 04/05/2020  ? AST 21 04/05/2020  ? ALKPHOS 58 04/05/2020  ? BILITOT 0.4 04/05/2020  ? ?Lab Results  ?Component Value Date  ? HGBA1C 5.8 (H) 04/17/2020  ? HGBA1C 6.0 (H) 10/11/2019  ? HGBA1C 5.7 (H) 11/12/2018  ? ?Lab Results  ?Component Value Date  ? INSULIN 25.8 (H) 04/17/2020  ? INSULIN 34.1 (H) 10/11/2019  ? ?Lab Results  ?Component Value Date  ? TSH 2.540 10/11/2019  ? ?Lab Results  ?Component  Value Date  ? CHOL 172 10/11/2019  ? HDL 36 (L) 10/11/2019  ? LDLCALC 108 (H) 10/11/2019  ? TRIG 155 (H) 10/11/2019  ? CHOLHDL 4.0 11/12/2018  ? ?Lab Results  ?Component Value Date  ? VD25OH 25.2 (L) 06/07/2021  ? VD25OH 26.3 (L) 10/18/2020  ? VD25OH 25.3 (L) 04/17/2020  ? ?Lab Results  ?Component Value Date  ? WBC 10.9 (H) 04/05/2020  ? HGB 13.0 04/05/2020  ? HCT 39.1 04/05/2020  ? MCV 84.8 04/05/2020  ? PLT 322 04/05/2020  ? ?No results found for: IRON, TIBC, FERRITIN ? ?Attestation Statements:  ? ?Reviewed by clinician on day of visit: allergies, medications, problem list,  medical history, surgical history, family history, social history, and previous encounter notes. ? ?Time spent on visit including pre-visit chart review and post-visit care and charting was 30 minutes.  ? ?I, Eugenia Mcalpine

## 2021-07-10 ENCOUNTER — Encounter (INDEPENDENT_AMBULATORY_CARE_PROVIDER_SITE_OTHER): Payer: Self-pay

## 2021-07-10 ENCOUNTER — Ambulatory Visit (INDEPENDENT_AMBULATORY_CARE_PROVIDER_SITE_OTHER): Payer: Medicaid Other | Admitting: Adult Health

## 2021-07-17 ENCOUNTER — Encounter (INDEPENDENT_AMBULATORY_CARE_PROVIDER_SITE_OTHER): Payer: Self-pay | Admitting: Adult Health

## 2021-07-17 ENCOUNTER — Ambulatory Visit (INDEPENDENT_AMBULATORY_CARE_PROVIDER_SITE_OTHER): Payer: Medicaid Other | Admitting: Adult Health

## 2021-07-17 VITALS — BP 104/70 | HR 65 | Temp 98.0°F | Ht 65.0 in | Wt 284.0 lb

## 2021-07-17 DIAGNOSIS — E559 Vitamin D deficiency, unspecified: Secondary | ICD-10-CM

## 2021-07-17 DIAGNOSIS — F3289 Other specified depressive episodes: Secondary | ICD-10-CM | POA: Diagnosis not present

## 2021-07-17 DIAGNOSIS — R7303 Prediabetes: Secondary | ICD-10-CM | POA: Diagnosis not present

## 2021-07-17 DIAGNOSIS — Z6841 Body Mass Index (BMI) 40.0 and over, adult: Secondary | ICD-10-CM

## 2021-07-17 DIAGNOSIS — E669 Obesity, unspecified: Secondary | ICD-10-CM | POA: Diagnosis not present

## 2021-07-17 DIAGNOSIS — E66813 Obesity, class 3: Secondary | ICD-10-CM

## 2021-07-17 MED ORDER — BUPROPION HCL ER (SR) 200 MG PO TB12: 200.0000 mg | ORAL_TABLET | Freq: Every day | ORAL | 0 refills | Status: DC

## 2021-07-17 MED ORDER — CHOLECALCIFEROL 1.25 MG (50000 UT) PO TABS: ORAL_TABLET | ORAL | 0 refills | Status: DC

## 2021-07-17 MED ORDER — METFORMIN HCL 1000 MG PO TABS: 1000.0000 mg | ORAL_TABLET | Freq: Two times a day (BID) | ORAL | 0 refills | Status: DC

## 2021-07-18 LAB — COMPREHENSIVE METABOLIC PANEL
ALT: 17 IU/L (ref 0–32)
AST: 15 IU/L (ref 0–40)
Albumin/Globulin Ratio: 1.7 (ref 1.2–2.2)
Albumin: 4.7 g/dL (ref 3.8–4.8)
Alkaline Phosphatase: 72 IU/L (ref 44–121)
BUN/Creatinine Ratio: 15 (ref 9–23)
BUN: 10 mg/dL (ref 6–20)
Bilirubin Total: 0.4 mg/dL (ref 0.0–1.2)
CO2: 23 mmol/L (ref 20–29)
Calcium: 9.5 mg/dL (ref 8.7–10.2)
Chloride: 101 mmol/L (ref 96–106)
Creatinine, Ser: 0.66 mg/dL (ref 0.57–1.00)
Globulin, Total: 2.7 g/dL (ref 1.5–4.5)
Glucose: 81 mg/dL (ref 70–99)
Potassium: 4.5 mmol/L (ref 3.5–5.2)
Sodium: 138 mmol/L (ref 134–144)
Total Protein: 7.4 g/dL (ref 6.0–8.5)
eGFR: 119 mL/min/{1.73_m2} (ref >59)

## 2021-07-18 LAB — HEMOGLOBIN A1C
Est. average glucose Bld gHb Est-mCnc: 111 mg/dL
Hgb A1c MFr Bld: 5.5 % (ref 4.8–5.6)

## 2021-07-18 LAB — INSULIN, RANDOM: INSULIN: 20.5 u[IU]/mL (ref 2.6–24.9)

## 2021-07-18 LAB — VITAMIN D 25 HYDROXY (VIT D DEFICIENCY, FRACTURES): Vit D, 25-Hydroxy: 28.4 ng/mL — ABNORMAL LOW (ref 30.0–100.0)

## 2021-07-18 LAB — VITAMIN B12: Vitamin B-12: 347 pg/mL (ref 232–1245)

## 2021-07-22 DIAGNOSIS — F32A Depression, unspecified: Secondary | ICD-10-CM | POA: Insufficient documentation

## 2021-07-22 NOTE — Progress Notes (Unsigned)
Chief Complaint:   OBESITY Cindy Dixon is here to discuss her progress with her obesity treatment plan along with follow-up of her obesity related diagnoses. [name] is on the Category 3 Plan and states she is following her eating plan approximately 50% of the time. [name] states she is walking 30 minutes 7 times per week.  Today's visit was #: 25 Starting weight: 321 lbs Starting date: 10/11/2019 Today's weight: 284 lbs Today's date: 07/17/2021 Total lbs lost to date: 37 lbs Total lbs lost since last in-office visit: 4 lbs  Interim History: Cindy Dixon has returned from 2 week trip to [place]  She will travel to [place] for 5 days in July with her family. Of note:  interpretor at bedside.  Subjective:   1. Pre-diabetes Evvie's cravings are well controlled on Metformin 1000 mg twice daily with meals.   2. Vitamin D deficiency She is on once weekly Ergocalciferol, last Vitamin D is still subtherapeutic.  06/07/2021, Vitamin D level 25.2.   3. Other depression, with emotional eating Payslee reports stable mood, she denies any suicidal or homicidal  ideations.    Assessment/Plan:   1. Pre-diabetes Check labs today.  Refill Metformin 1000 mg twice daily with meals, Dispense #180, no refills. See below.   - metFORMIN (GLUCOPHAGE) 1000 MG tablet; Take 1 tablet (1,000 mg total) by mouth 2 (two) times daily with a meal.  Dispense: 180 tablet; Refill: 0 - Comprehensive metabolic panel - Hemoglobin A1c - Insulin, random - Vitamin B12  2. Vitamin D deficiency Refill Ergocalciferol 50,000 IU twice weekly dispense #12, no refills, see below.   - Cholecalciferol 1.25 MG (50000 UT) TABS; Take one capsule twice per week  Dispense: 12 tablet; Refill: 0 - VITAMIN D 25 Hydroxy (Vit-D Deficiency, Fractures)  3. Other depression, with emotional eating Refill Buproprion SR 200 mg daily dispense #90 no refills, see below.   - buPROPion (WELLBUTRIN SR) 200 MG 12 hr  tablet; Take 1 tablet (200 mg total) by mouth daily.  Dispense: 90 tablet; Refill: 0  4. Obesity, with current BMI of 47.3 Cindy Dixon is currently in the action stage of change. As such, her goal is to continue with weight loss efforts. She has agreed to the Category 3 Plan.   Exercise goals:  As is.   Behavioral modification strategies: increasing lean protein intake, decreasing simple carbohydrates, meal planning and cooking strategies, keeping healthy foods in the home, and planning for success.  Cindy Dixon has agreed to follow-up with our clinic in 3 weeks. She was informed of the importance of frequent follow-up visits to maximize her success with intensive lifestyle modifications for her multiple health conditions.   Cindy Dixon was informed we would discuss her lab results at her next visit unless there is a critical issue that needs to be addressed sooner. Cindy Dixon agreed to keep her next visit at the agreed upon time to discuss these results.  Objective:   Blood pressure 104/70, pulse 65, temperature 98 F (36.7 C), height 5\' 5"  (1.651 m), weight 284 lb (128.8 kg), SpO2 98 %. Body mass index is 47.26 kg/m.  General: Cooperative, alert, well developed, in no acute distress. HEENT: Conjunctivae and lids unremarkable. Cardiovascular: Regular rhythm.  Lungs: Normal work of breathing. Neurologic: No focal deficits.   Lab Results  Component Value Date   CREATININE 0.66 07/17/2021   BUN 10 07/17/2021   NA 138 07/17/2021   K 4.5 07/17/2021   CL 101 07/17/2021   CO2 23 07/17/2021  Lab Results  Component Value Date   ALT 17 07/17/2021   AST 15 07/17/2021   ALKPHOS 72 07/17/2021   BILITOT 0.4 07/17/2021   Lab Results  Component Value Date   HGBA1C 5.5 07/17/2021   HGBA1C 5.8 (H) 04/17/2020   HGBA1C 6.0 (H) 10/11/2019   HGBA1C 5.7 (H) 11/12/2018   Lab Results  Component Value Date   INSULIN 20.5 07/17/2021   INSULIN 25.8 (H) 04/17/2020   INSULIN 34.1 (H) 10/11/2019    Lab Results  Component Value Date   TSH 2.540 10/11/2019   Lab Results  Component Value Date   CHOL 172 10/11/2019   HDL 36 (L) 10/11/2019   LDLCALC 108 (H) 10/11/2019   TRIG 155 (H) 10/11/2019   CHOLHDL 4.0 11/12/2018   Lab Results  Component Value Date   VD25OH 28.4 (L) 07/17/2021   VD25OH 25.2 (L) 06/07/2021   VD25OH 26.3 (L) 10/18/2020   Lab Results  Component Value Date   WBC 10.9 (H) 04/05/2020   HGB 13.0 04/05/2020   HCT 39.1 04/05/2020   MCV 84.8 04/05/2020   PLT 322 04/05/2020   No results found for: "IRON", "TIBC", "FERRITIN"  Attestation Statements:   Reviewed by clinician on day of visit: allergies, medications, problem list, medical history, surgical history, family history, social history, and previous encounter notes.  Time spent on visit including pre-visit chart review and post-visit care and charting was 28 minutes.   I, Malcolm Metro, RMA, am acting as Energy manager for William Hamburger, NP.  I have reviewed the above documentation for accuracy and completeness, and I agree with the above. -  ***

## 2021-08-01 ENCOUNTER — Ambulatory Visit: Payer: Medicaid Other | Admitting: Psychiatry

## 2021-08-01 ENCOUNTER — Encounter: Payer: Self-pay | Admitting: Psychiatry
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
## 2021-08-21 ENCOUNTER — Ambulatory Visit (INDEPENDENT_AMBULATORY_CARE_PROVIDER_SITE_OTHER): Payer: Medicaid Other | Admitting: Adult Health

## 2021-09-12 ENCOUNTER — Encounter (INDEPENDENT_AMBULATORY_CARE_PROVIDER_SITE_OTHER): Payer: Self-pay | Admitting: Adult Health

## 2021-09-12 ENCOUNTER — Ambulatory Visit (INDEPENDENT_AMBULATORY_CARE_PROVIDER_SITE_OTHER): Payer: Medicaid Other | Admitting: Adult Health

## 2021-09-12 VITALS — BP 101/69 | HR 78 | Temp 98.2°F | Ht 65.0 in | Wt 284.0 lb

## 2021-09-12 DIAGNOSIS — F3289 Other specified depressive episodes: Secondary | ICD-10-CM | POA: Diagnosis not present

## 2021-09-12 DIAGNOSIS — R7303 Prediabetes: Secondary | ICD-10-CM

## 2021-09-12 DIAGNOSIS — E669 Obesity, unspecified: Secondary | ICD-10-CM | POA: Diagnosis not present

## 2021-09-12 DIAGNOSIS — E559 Vitamin D deficiency, unspecified: Secondary | ICD-10-CM

## 2021-09-12 DIAGNOSIS — Z6841 Body Mass Index (BMI) 40.0 and over, adult: Secondary | ICD-10-CM | POA: Diagnosis not present

## 2021-09-12 MED ORDER — SERTRALINE HCL 100 MG PO TABS: 100.0000 mg | ORAL_TABLET | Freq: Every day | ORAL | 0 refills | Status: DC

## 2021-09-12 MED ORDER — CHOLECALCIFEROL 1.25 MG (50000 UT) PO TABS: ORAL_TABLET | ORAL | 0 refills | Status: DC

## 2021-09-12 MED ORDER — METFORMIN HCL 1000 MG PO TABS: 1000.0000 mg | ORAL_TABLET | Freq: Two times a day (BID) | ORAL | 0 refills | Status: DC

## 2021-09-18 NOTE — Progress Notes (Addendum)
Chief Complaint:   OBESITY Jaina is here to discuss her progress with her obesity treatment plan along with follow-up of her obesity related diagnoses. Dakota is on the Category 3 Plan and states she is following her eating plan approximately 40% of the time. Weston states she is walking 30 minutes 1 times per week.  Today's visit was #: 26 Starting weight: 321 lbs Starting date: 10/11/2019 Today's weight: 284 lbs Today's date: 09/12/2021 Total lbs lost to date: 37 lbs Total lbs lost since last in-office visit: 0  Interim History:  Meliss's bioimepdance results discussed-  Muscle mass + 3.8 lbs.   She has traveled to [place] since last office visit.  She will be home for the next month.   Note: [place] interpretor at BS---Super helpful!  Subjective:   1. Pre-diabetes Labs discussed during visit today.  On 07/17/21 Maki's A1c was 5.5, blood glucose was 81, insulin was 20.5.  On 07/17/21 her CMP GFR :119, B12 level: 347. She is currently taking Metformin 1000 mg twice a day.  Tolerating well.  2. Vitamin D deficiency Labs discussed during visit today. On 07/17/21 Vit D Level- 28.4 07/17/21-Catilyn's ergocalciferol increased from once a week to twice a week due to subtherapeutic Vit D level.  She endorses stable energy level.  3. Other depression, with emotional eating Gradie reports stable mood. Denies suicidal ideas, and homicidal ideas.  Assessment/Plan:   1. Pre-diabetes  -Refill metFORMIN (GLUCOPHAGE) 1000 MG tablet; Take 1 tablet (1,000 mg total) by mouth 2 (two) times daily with a meal.  Dispense: 180 tablet; Refill: 0  2. Vitamin D deficiency Recheck Vit D Level 2 months.  Refill Cholecalciferol 1.25 MG (50000 UT) TABS; Take one capsule twice per week  Dispense: 12 tablet; Refill: 0  3. Other depression, with emotional eating  Refill sertraline (ZOLOFT) 100 MG tablet; Take 1 tablet (100 mg total) by mouth daily.  Dispense: 90  tablet; Refill: 0  4. Obesity, with current BMI of 47.3 Nadean is currently in the action stage of change. As such, her goal is to continue with weight loss efforts. She has agreed to the Category 3 Plan.   Exercise goals: Dimitria will increase walking to 2 times a week. Increase meal plan compliance to >80%.  Behavioral modification strategies: increasing lean protein intake, decreasing simple carbohydrates, meal planning and cooking strategies, keeping healthy foods in the home, and planning for success.  Leahanna has agreed to follow-up with our clinic in 4 weeks. She was informed of the importance of frequent follow-up visits to maximize her success with intensive lifestyle modifications for her multiple health conditions.   Objective:   Blood pressure 101/69, pulse 78, temperature 98.2 F (36.8 C), height 5\' 5"  (1.651 m), weight 284 lb (128.8 kg), last menstrual period 09/02/2021, SpO2 98 %. Body mass index is 47.26 kg/m.  General: Cooperative, alert, well developed, in no acute distress. HEENT: Conjunctivae and lids unremarkable. Cardiovascular: Regular rhythm.  Lungs: Normal work of breathing. Neurologic: No focal deficits.   Lab Results  Component Value Date   CREATININE 0.66 07/17/2021   BUN 10 07/17/2021   NA 138 07/17/2021   K 4.5 07/17/2021   CL 101 07/17/2021   CO2 23 07/17/2021   Lab Results  Component Value Date   ALT 17 07/17/2021   AST 15 07/17/2021   ALKPHOS 72 07/17/2021   BILITOT 0.4 07/17/2021   Lab Results  Component Value Date   HGBA1C 5.5 07/17/2021   HGBA1C  5.8 (H) 04/17/2020   HGBA1C 6.0 (H) 10/11/2019   HGBA1C 5.7 (H) 11/12/2018   Lab Results  Component Value Date   INSULIN 20.5 07/17/2021   INSULIN 25.8 (H) 04/17/2020   INSULIN 34.1 (H) 10/11/2019   Lab Results  Component Value Date   TSH 2.540 10/11/2019   Lab Results  Component Value Date   CHOL 172 10/11/2019   HDL 36 (L) 10/11/2019   LDLCALC 108 (H) 10/11/2019    TRIG 155 (H) 10/11/2019   CHOLHDL 4.0 11/12/2018   Lab Results  Component Value Date   VD25OH 28.4 (L) 07/17/2021   VD25OH 25.2 (L) 06/07/2021   VD25OH 26.3 (L) 10/18/2020   Lab Results  Component Value Date   WBC 10.9 (H) 04/05/2020   HGB 13.0 04/05/2020   HCT 39.1 04/05/2020   MCV 84.8 04/05/2020   PLT 322 04/05/2020   No results found for: "IRON", "TIBC", "FERRITIN"  Attestation Statements:   Reviewed by clinician on day of visit: allergies, medications, problem list, medical history, surgical history, family history, social history, and previous encounter notes.  I, Brendell Tyus, RMA, am acting as transcriptionist for William Hamburger, NP.  I have reviewed the above documentation for accuracy and completeness, and I agree with the above. -  Natania Finigan Tat d. Luwanna Brossman, NP-C

## 2021-09-19 ENCOUNTER — Encounter (INDEPENDENT_AMBULATORY_CARE_PROVIDER_SITE_OTHER): Payer: Self-pay

## 2021-10-10 ENCOUNTER — Ambulatory Visit (INDEPENDENT_AMBULATORY_CARE_PROVIDER_SITE_OTHER): Payer: Medicaid Other | Admitting: Adult Health

## 2021-11-20 ENCOUNTER — Ambulatory Visit (INDEPENDENT_AMBULATORY_CARE_PROVIDER_SITE_OTHER): Payer: Medicaid Other | Admitting: Adult Health

## 2021-11-20 ENCOUNTER — Encounter (INDEPENDENT_AMBULATORY_CARE_PROVIDER_SITE_OTHER): Payer: Self-pay | Admitting: Adult Health

## 2021-11-20 VITALS — BP 125/87 | HR 67 | Temp 98.3°F | Ht 68.0 in | Wt 285.0 lb

## 2021-11-20 DIAGNOSIS — G43809 Other migraine, not intractable, without status migrainosus: Secondary | ICD-10-CM | POA: Diagnosis not present

## 2021-11-20 DIAGNOSIS — Z6841 Body Mass Index (BMI) 40.0 and over, adult: Secondary | ICD-10-CM | POA: Diagnosis not present

## 2021-11-20 DIAGNOSIS — F3289 Other specified depressive episodes: Secondary | ICD-10-CM

## 2021-11-20 DIAGNOSIS — E559 Vitamin D deficiency, unspecified: Secondary | ICD-10-CM | POA: Diagnosis not present

## 2021-11-20 DIAGNOSIS — R7303 Prediabetes: Secondary | ICD-10-CM | POA: Diagnosis not present

## 2021-11-20 DIAGNOSIS — E669 Obesity, unspecified: Secondary | ICD-10-CM | POA: Diagnosis not present

## 2021-11-20 DIAGNOSIS — K219 Gastro-esophageal reflux disease without esophagitis: Secondary | ICD-10-CM | POA: Diagnosis not present

## 2021-11-20 MED ORDER — ONDANSETRON HCL 4 MG PO TABS: 4.0000 mg | ORAL_TABLET | Freq: Three times a day (TID) | ORAL | 1 refills | Status: DC | PRN

## 2021-11-20 MED ORDER — SUMATRIPTAN SUCCINATE 50 MG PO TABS: 50.0000 mg | ORAL_TABLET | ORAL | 0 refills | Status: DC | PRN

## 2021-11-20 MED ORDER — SERTRALINE HCL 100 MG PO TABS: 100.0000 mg | ORAL_TABLET | Freq: Every day | ORAL | 0 refills | Status: DC

## 2021-11-20 MED ORDER — PANTOPRAZOLE SODIUM 40 MG PO TBEC: 40.0000 mg | DELAYED_RELEASE_TABLET | Freq: Every day | ORAL | 0 refills | Status: DC

## 2021-11-20 MED ORDER — BUPROPION HCL ER (SR) 200 MG PO TB12: 200.0000 mg | ORAL_TABLET | Freq: Every day | ORAL | 0 refills | Status: DC

## 2021-11-21 LAB — HEMOGLOBIN A1C
Est. average glucose Bld gHb Est-mCnc: 111 mg/dL
Hgb A1c MFr Bld: 5.5 % (ref 4.8–5.6)

## 2021-11-21 LAB — VITAMIN D 25 HYDROXY (VIT D DEFICIENCY, FRACTURES): Vit D, 25-Hydroxy: 25.2 ng/mL — ABNORMAL LOW (ref 30.0–100.0)

## 2021-11-21 NOTE — Telephone Encounter (Signed)
Note not needed 

## 2021-11-27 NOTE — Progress Notes (Unsigned)
Chief Complaint:   OBESITY Cindy Dixon is here to discuss her progress with her obesity treatment plan along with follow-up of her obesity related diagnoses. Cindy Dixon is on the Category 3 Plan and states she is following her eating plan approximately 50% of the time. Cindy Dixon states she is walking 30 minutes 5 times per week.  Today's visit was #: 59 Starting weight: 321 lbs Starting date: 10/11/2019 Today's weight: 285 lbs Today's date: 11/20/2021 Total lbs lost to date: 36 lbs Total lbs lost since last in-office visit: +1 lb  Interim History:  Cindy Dixon provided the following intake that is typical of a day: Breakfast:  oatmeal or eggs. Lunch:  salad with chicken. Dinner:  often skips due to low appetite.  Of note:  [place] interpretor at bedside.   Subjective:   1. Gastroesophageal reflux disease, unspecified whether esophagitis present GERD symptoms will subside with Protonix dose.   2. Other migraine without status migrainosus, not intractable She estimates to experience daily headaches.   3. Pre-diabetes Currently on metformin 1000 mg BID.   She experienced an episode of hypoglycemia.   She does not check CBG at home.  4. Vitamin D deficiency 07/17/21 Vit D Level- 28.4- well below goal of 50-70.  5. Other depression, with emotional eating She reports stable mood, denies SI/HI. She is on sertraline (ZOLOFT) 100 MG tablet QD, buPROPion (WELLBUTRIN SR) 200 MG 12 hr tablet QD.  Assessment/Plan:   1. Gastroesophageal reflux disease, unspecified whether esophagitis present Refill - pantoprazole (PROTONIX) 40 MG tablet; Take 1 tablet (40 mg total) by mouth daily.  Dispense: 90 tablet; Refill: 0  2. Other migraine without status migrainosus, not intractable Refill - SUMAtriptan (IMITREX) 50 MG tablet; Take 1 tablet (50 mg total) by mouth as needed for migraine. May repeat in 2 hours if headache persists or recurs. Do not exceed 2 tablets in 24 hours.   Dispense: 30 tablet; Refill: 0  Refill - ondansetron (ZOFRAN) 4 MG tablet; Take 1 tablet (4 mg total) by mouth every 8 (eight) hours as needed for nausea or vomiting.  Dispense: 30 tablet; Refill: 1  3. Pre-diabetes Check labs today and then mychart message her with level and refill metformin as appropriate.   - Hemoglobin A1c  4. Vitamin D deficiency Check labs today.   - VITAMIN D 25 Hydroxy (Vit-D Deficiency, Fractures)  5. Other depression, with emotional eating Refill - sertraline (ZOLOFT) 100 MG tablet; Take 1 tablet (100 mg total) by mouth daily.  Dispense: 90 tablet; Refill: 0  Refill - buPROPion (WELLBUTRIN SR) 200 MG 12 hr tablet; Take 1 tablet (200 mg total) by mouth daily.  Dispense: 90 tablet; Refill: 0  6. Obesity, with current BMI of 43.4 1) Dinner protein at lunch. 2) Fruit at night.   Cindy Dixon is currently in the action stage of change. As such, her goal is to continue with weight loss efforts. She has agreed to the Category 3 Plan.   Exercise goals:  As is.   Behavioral modification strategies: increasing lean protein intake, decreasing simple carbohydrates, meal planning and cooking strategies, keeping healthy foods in the home, and planning for success.  Cindy Dixon has agreed to follow-up with our clinic in 4 weeks. She was informed of the importance of frequent follow-up visits to maximize her success with intensive lifestyle modifications for her multiple health conditions.   Objective:   Blood pressure 125/87, pulse 67, temperature 98.3 F (36.8 C), height 5\' 8"  (1.727 m), weight 285  lb (129.3 kg), SpO2 96 %. Body mass index is 43.33 kg/m.  General: Cooperative, alert, well developed, in no acute distress. HEENT: Conjunctivae and lids unremarkable. Cardiovascular: Regular rhythm.  Lungs: Normal work of breathing. Neurologic: No focal deficits.   Lab Results  Component Value Date   CREATININE 0.66 07/17/2021   BUN 10 07/17/2021   NA 138  07/17/2021   K 4.5 07/17/2021   CL 101 07/17/2021   CO2 23 07/17/2021   Lab Results  Component Value Date   ALT 17 07/17/2021   AST 15 07/17/2021   ALKPHOS 72 07/17/2021   BILITOT 0.4 07/17/2021   Lab Results  Component Value Date   HGBA1C 5.5 11/20/2021   HGBA1C 5.5 07/17/2021   HGBA1C 5.8 (H) 04/17/2020   HGBA1C 6.0 (H) 10/11/2019   HGBA1C 5.7 (H) 11/12/2018   Lab Results  Component Value Date   INSULIN 20.5 07/17/2021   INSULIN 25.8 (H) 04/17/2020   INSULIN 34.1 (H) 10/11/2019   Lab Results  Component Value Date   TSH 2.540 10/11/2019   Lab Results  Component Value Date   CHOL 172 10/11/2019   HDL 36 (L) 10/11/2019   LDLCALC 108 (H) 10/11/2019   TRIG 155 (H) 10/11/2019   CHOLHDL 4.0 11/12/2018   Lab Results  Component Value Date   VD25OH 25.2 (L) 11/20/2021   VD25OH 28.4 (L) 07/17/2021   VD25OH 25.2 (L) 06/07/2021   Lab Results  Component Value Date   WBC 10.9 (H) 04/05/2020   HGB 13.0 04/05/2020   HCT 39.1 04/05/2020   MCV 84.8 04/05/2020   PLT 322 04/05/2020   No results found for: "IRON", "TIBC", "FERRITIN"  Attestation Statements:   Reviewed by clinician on day of visit: allergies, medications, problem list, medical history, surgical history, family history, social history, and previous encounter notes.  I, Malcolm Metro, RMA, am acting as Energy manager for William Hamburger, NP.  I have reviewed the above documentation for accuracy and completeness, and I agree with the above. -  Katy d. Danford, NP-C

## 2021-12-09 ENCOUNTER — Other Ambulatory Visit (INDEPENDENT_AMBULATORY_CARE_PROVIDER_SITE_OTHER): Payer: Self-pay | Admitting: Adult Health

## 2021-12-09 DIAGNOSIS — E559 Vitamin D deficiency, unspecified: Secondary | ICD-10-CM

## 2021-12-18 ENCOUNTER — Encounter (INDEPENDENT_AMBULATORY_CARE_PROVIDER_SITE_OTHER): Payer: Medicaid Other | Admitting: Family Medicine

## 2021-12-18 ENCOUNTER — Ambulatory Visit (INDEPENDENT_AMBULATORY_CARE_PROVIDER_SITE_OTHER): Payer: Medicaid Other | Admitting: Internal Medicine

## 2021-12-18 ENCOUNTER — Encounter (INDEPENDENT_AMBULATORY_CARE_PROVIDER_SITE_OTHER): Payer: Self-pay | Admitting: Family Medicine

## 2021-12-18 ENCOUNTER — Encounter (INDEPENDENT_AMBULATORY_CARE_PROVIDER_SITE_OTHER): Payer: Self-pay | Admitting: Internal Medicine

## 2021-12-18 VITALS — BP 127/85 | HR 72 | Temp 98.7°F | Wt 289.0 lb

## 2021-12-18 DIAGNOSIS — E669 Obesity, unspecified: Secondary | ICD-10-CM

## 2021-12-18 DIAGNOSIS — F3289 Other specified depressive episodes: Secondary | ICD-10-CM | POA: Diagnosis not present

## 2021-12-18 DIAGNOSIS — K219 Gastro-esophageal reflux disease without esophagitis: Secondary | ICD-10-CM

## 2021-12-18 DIAGNOSIS — R7303 Prediabetes: Secondary | ICD-10-CM

## 2021-12-18 DIAGNOSIS — E559 Vitamin D deficiency, unspecified: Secondary | ICD-10-CM | POA: Diagnosis not present

## 2021-12-18 DIAGNOSIS — Z6841 Body Mass Index (BMI) 40.0 and over, adult: Secondary | ICD-10-CM | POA: Diagnosis not present

## 2021-12-18 MED ORDER — PANTOPRAZOLE SODIUM 40 MG PO TBEC: 40.0000 mg | DELAYED_RELEASE_TABLET | Freq: Every day | ORAL | 0 refills | Status: DC

## 2021-12-18 MED ORDER — METFORMIN HCL 1000 MG PO TABS: 1000.0000 mg | ORAL_TABLET | Freq: Two times a day (BID) | ORAL | 0 refills | Status: DC

## 2021-12-18 MED ORDER — CHOLECALCIFEROL 1.25 MG (50000 UT) PO TABS: ORAL_TABLET | ORAL | 0 refills | Status: DC

## 2021-12-18 MED ORDER — SERTRALINE HCL 100 MG PO TABS: 100.0000 mg | ORAL_TABLET | Freq: Every day | ORAL | 0 refills | Status: DC

## 2021-12-18 MED ORDER — BUPROPION HCL ER (SR) 200 MG PO TB12: 200.0000 mg | ORAL_TABLET | Freq: Every day | ORAL | 0 refills | Status: DC

## 2022-01-01 ENCOUNTER — Other Ambulatory Visit (INDEPENDENT_AMBULATORY_CARE_PROVIDER_SITE_OTHER): Payer: Self-pay | Admitting: Internal Medicine

## 2022-01-01 DIAGNOSIS — E559 Vitamin D deficiency, unspecified: Secondary | ICD-10-CM

## 2022-01-06 NOTE — Progress Notes (Signed)
Chief Complaint:   OBESITY Cindy Dixon is here to discuss her progress with her obesity treatment plan along with follow-up of her obesity related diagnoses. Cindy Dixon is on the Category 3 Plan and states she is following her eating plan approximately 50% of the time. Cindy Dixon states she is walking 30 minutes 5 times per week.  Today's visit was #: 28 Starting weight: 321 lbs Starting date: 10/11/2019 Today's weight: 289 lbs Today's date: 12/18/2021 Total lbs lost to date: 32 Total lbs lost since last in-office visit: +4  Interim History: This is my first encounter with patient. Pt has gained weight due to increased stress/anxiety driven unhealthy snacking. She is on Bupropion and Zoloft, and also Metformin without side effects.   Subjective:   1. Pre-diabetes Discussed labs with patient today. Pt's A1c is 5.5 and improved from 6.0.  2. Vitamin D deficiency Discussed labs with patient today. Vit D level is 25.2. Pt reports taking supplement but levels are unchanged.  3. Other depression, with emotional eating Pt is on sertraline and bupropion.  4. Gastroesophageal reflux disease, unspecified whether esophagitis present Pt's symptoms are associated with obesity. Symptoms improved on PPI, and she denies red flag symptoms.  Assessment/Plan:   1. Pre-diabetes Continue weight loss therapy.  Refill- metFORMIN (GLUCOPHAGE) 1000 MG tablet; Take 1 tablet (1,000 mg total) by mouth 2 (two) times daily with a meal.  Dispense: 180 tablet; Refill: 0  2. Vitamin D deficiency Continue high dose Vit D once a week. Recheck levels in 3 months, and if level is still low, will increase to twice a week.  Refill- Cholecalciferol 1.25 MG (50000 UT) TABS; Take one capsule twice per week  Dispense: 12 tablet; Refill: 0  3. Other depression, with emotional eating Pt counseled on behavioral strategies. Pt given information on relaxation techniques in Spanish.  Refill- buPROPion (WELLBUTRIN  SR) 200 MG 12 hr tablet; Take 1 tablet (200 mg total) by mouth daily.  Dispense: 90 tablet; Refill: 0 Refill- sertraline (ZOLOFT) 100 MG tablet; Take 1 tablet (100 mg total) by mouth daily.  Dispense: 90 tablet; Refill: 0  4. Gastroesophageal reflux disease, unspecified whether esophagitis present Check B12 and magnesium levels with next labs, due to long term use of pantoprazole.  Refill- pantoprazole (PROTONIX) 40 MG tablet; Take 1 tablet (40 mg total) by mouth daily.  Dispense: 90 tablet; Refill: 0  5. Obesity, with current BMI of 43.9 Cindy Dixon is currently in the action stage of change. As such, her goal is to continue with weight loss efforts. She has agreed to the Category 3 Plan.   Exercise goals:  As is  Behavioral modification strategies: better snacking choices, emotional eating strategies, avoiding temptations, and planning for success.  Cindy Dixon has agreed to follow-up with our clinic in 4 weeks. She was informed of the importance of frequent follow-up visits to maximize her success with intensive lifestyle modifications for her multiple health conditions.   Objective:   There were no vitals taken for this visit. There is no height or weight on file to calculate BMI.  General: Cooperative, alert, well developed, in no acute distress. HEENT: Conjunctivae and lids unremarkable. Cardiovascular: Regular rhythm.  Lungs: Normal work of breathing. Neurologic: No focal deficits.   Lab Results  Component Value Date   CREATININE 0.66 07/17/2021   BUN 10 07/17/2021   NA 138 07/17/2021   K 4.5 07/17/2021   CL 101 07/17/2021   CO2 23 07/17/2021   Lab Results  Component Value Date  ALT 17 07/17/2021   AST 15 07/17/2021   ALKPHOS 72 07/17/2021   BILITOT 0.4 07/17/2021   Lab Results  Component Value Date   HGBA1C 5.5 11/20/2021   HGBA1C 5.5 07/17/2021   HGBA1C 5.8 (H) 04/17/2020   HGBA1C 6.0 (H) 10/11/2019   HGBA1C 5.7 (H) 11/12/2018   Lab Results  Component  Value Date   INSULIN 20.5 07/17/2021   INSULIN 25.8 (H) 04/17/2020   INSULIN 34.1 (H) 10/11/2019   Lab Results  Component Value Date   TSH 2.540 10/11/2019   Lab Results  Component Value Date   CHOL 172 10/11/2019   HDL 36 (L) 10/11/2019   LDLCALC 108 (H) 10/11/2019   TRIG 155 (H) 10/11/2019   CHOLHDL 4.0 11/12/2018   Lab Results  Component Value Date   VD25OH 25.2 (L) 11/20/2021   VD25OH 28.4 (L) 07/17/2021   VD25OH 25.2 (L) 06/07/2021   Lab Results  Component Value Date   WBC 10.9 (H) 04/05/2020   HGB 13.0 04/05/2020   HCT 39.1 04/05/2020   MCV 84.8 04/05/2020   PLT 322 04/05/2020    Attestation Statements:   Reviewed by clinician on day of visit: allergies, medications, problem list, medical history, surgical history, family history, social history, and previous encounter notes.  Time spent on visit including pre-visit chart review and post-visit care and charting was 20 minutes.   I, Kyung Rudd, BS, CMA, am acting as transcriptionist for Worthy Rancher, MD.  I have reviewed the above documentation for accuracy and completeness, and I agree with the above. -Worthy Rancher, MD

## 2022-01-15 ENCOUNTER — Ambulatory Visit (INDEPENDENT_AMBULATORY_CARE_PROVIDER_SITE_OTHER): Payer: Medicaid Other | Admitting: Internal Medicine

## 2022-01-15 ENCOUNTER — Encounter (INDEPENDENT_AMBULATORY_CARE_PROVIDER_SITE_OTHER): Payer: Self-pay | Admitting: Internal Medicine

## 2022-01-15 VITALS — BP 125/82 | HR 71 | Temp 98.1°F | Ht 68.0 in | Wt 289.0 lb

## 2022-01-15 DIAGNOSIS — Z6841 Body Mass Index (BMI) 40.0 and over, adult: Secondary | ICD-10-CM

## 2022-01-15 DIAGNOSIS — E559 Vitamin D deficiency, unspecified: Secondary | ICD-10-CM | POA: Diagnosis not present

## 2022-01-15 DIAGNOSIS — F419 Anxiety disorder, unspecified: Secondary | ICD-10-CM

## 2022-01-15 DIAGNOSIS — R632 Polyphagia: Secondary | ICD-10-CM | POA: Diagnosis not present

## 2022-01-15 DIAGNOSIS — R7303 Prediabetes: Secondary | ICD-10-CM | POA: Diagnosis not present

## 2022-01-15 DIAGNOSIS — E669 Obesity, unspecified: Secondary | ICD-10-CM

## 2022-01-15 DIAGNOSIS — K219 Gastro-esophageal reflux disease without esophagitis: Secondary | ICD-10-CM

## 2022-01-15 MED ORDER — SERTRALINE HCL 100 MG PO TABS: 100.0000 mg | ORAL_TABLET | Freq: Every day | ORAL | 0 refills | Status: DC

## 2022-01-15 MED ORDER — METFORMIN HCL 1000 MG PO TABS: 1000.0000 mg | ORAL_TABLET | Freq: Two times a day (BID) | ORAL | 0 refills | Status: DC

## 2022-01-15 MED ORDER — PANTOPRAZOLE SODIUM 40 MG PO TBEC: 40.0000 mg | DELAYED_RELEASE_TABLET | Freq: Every day | ORAL | 0 refills | Status: DC

## 2022-01-15 MED ORDER — SERTRALINE HCL 50 MG PO TABS: 50.0000 mg | ORAL_TABLET | Freq: Every day | ORAL | 0 refills | Status: DC

## 2022-01-15 MED ORDER — CHOLECALCIFEROL 1.25 MG (50000 UT) PO TABS: ORAL_TABLET | ORAL | 0 refills | Status: DC

## 2022-01-27 NOTE — Progress Notes (Signed)
Chief Complaint:   OBESITY Cindy Dixon is here to discuss her progress with her obesity treatment plan along with follow-up of her obesity related diagnoses. Cindy Dixon is on the Category 3 Plan and states she is following her eating plan approximately 50% of the time. Cindy Dixon states she is walking 30 minutes 5 times per week.  Today's visit was #: 29 Starting weight: 321 lbs Starting date: 10/11/2019 Today's weight: 289 lbs Today's date: 01/15/2022 Total lbs lost to date: 32 Total lbs lost since last in-office visit: 0  Interim History: Patient is new to me. On medication review, pt has not been taking several of her medications. She states she never took sertraline, and her last prescription and also did not go through. She notes increased hunger, anxiety, and cravings since running out of medication.  Subjective:   1. Polyphagia Multifactorial.   2. Gastroesophageal reflux disease, unspecified whether esophagitis present Pt is symptomatic but has no abnormal symptoms.she had an EGD 04/2019. Pathology 1 out 3 minimal inflammation.  3. Vitamin D deficiency Discussed labs with patient today. Vitamin D level 26.3. Pt did not receive refill last OV.  4. Anxiety Pt is not taking any of her prescriptions. She reports she never received the sertraline.  5. Pre-diabetes Cindy Dixon has a diagnosis of prediabetes based on her elevated HgA1c and was informed this puts her at greater risk of developing diabetes. She continues to work on diet and exercise to decrease her risk of diabetes. She denies nausea or hypoglycemia.  Assessment/Plan:   1. Polyphagia Continue meal plan. Start sertraline 25 mg for 7 days, then 50 mg in the evening. Refill printed.  Start- sertraline (ZOLOFT) 50 MG tablet; Take 1 tablet (50 mg total) by mouth daily.  Dispense: 30 tablet; Refill: 0  2. Gastroesophageal reflux disease, unspecified whether esophagitis present Continue to monitor.  Refill-  pantoprazole (PROTONIX) 40 MG tablet; Take 1 tablet (40 mg total) by mouth daily.  Dispense: 90 tablet; Refill: 0  3. Vitamin D deficiency She agrees to continue to take prescription Vitamin D @50 ,000 IU every week and will follow-up for routine testing of Vitamin D, at least 2-3 times per year to avoid over-replacement.  Refill- Cholecalciferol 1.25 MG (50000 UT) TABS; Take one capsule twice per week  Dispense: 12 tablet; Refill: 0  4. Anxiety Discontinue bupropion due to anxiety. Start SSRI as above.  Start- sertraline (ZOLOFT) 50 MG tablet; Take 1 tablet (50 mg total) by mouth daily.  Dispense: 30 tablet; Refill: 0  5. Pre-diabetes Cindy Dixon will continue to work on weight loss, exercise, and decreasing simple carbohydrates to help decrease the risk of diabetes.   Refill- metFORMIN (GLUCOPHAGE) 1000 MG tablet; Take 1 tablet (1,000 mg total) by mouth 2 (two) times daily with a meal.  Dispense: 180 tablet; Refill: 0  6. Obesity, with current BMI of 44 Cindy Dixon is currently in the action stage of change. As such, her goal is to continue with weight loss efforts. She has agreed to the Category 3 Plan.   Resume Metformin for incretin effect.  Exercise goals:  As is  Behavioral modification strategies: increasing lean protein intake, increasing water intake, emotional eating strategies, avoiding temptations, and planning for success.  Cindy Dixon has agreed to follow-up with our clinic in 2 weeks. She was informed of the importance of frequent follow-up visits to maximize her success with intensive lifestyle modifications for her multiple health conditions.   Objective:   Blood pressure 125/82, pulse 71, temperature 98.1 F (  36.7 C), height 5\' 8"  (1.727 m), weight 289 lb (131.1 kg), SpO2 100 %. Body mass index is 43.94 kg/m.  General: Cooperative, alert, well developed, in no acute distress. HEENT: Conjunctivae and lids unremarkable. Cardiovascular: Regular rhythm.  Lungs: Normal  work of breathing. Neurologic: No focal deficits.   Lab Results  Component Value Date   CREATININE 0.66 07/17/2021   BUN 10 07/17/2021   NA 138 07/17/2021   K 4.5 07/17/2021   CL 101 07/17/2021   CO2 23 07/17/2021   Lab Results  Component Value Date   ALT 17 07/17/2021   AST 15 07/17/2021   ALKPHOS 72 07/17/2021   BILITOT 0.4 07/17/2021   Lab Results  Component Value Date   HGBA1C 5.5 11/20/2021   HGBA1C 5.5 07/17/2021   HGBA1C 5.8 (H) 04/17/2020   HGBA1C 6.0 (H) 10/11/2019   HGBA1C 5.7 (H) 11/12/2018   Lab Results  Component Value Date   INSULIN 20.5 07/17/2021   INSULIN 25.8 (H) 04/17/2020   INSULIN 34.1 (H) 10/11/2019   Lab Results  Component Value Date   TSH 2.540 10/11/2019   Lab Results  Component Value Date   CHOL 172 10/11/2019   HDL 36 (L) 10/11/2019   LDLCALC 108 (H) 10/11/2019   TRIG 155 (H) 10/11/2019   CHOLHDL 4.0 11/12/2018   Lab Results  Component Value Date   VD25OH 25.2 (L) 11/20/2021   VD25OH 28.4 (L) 07/17/2021   VD25OH 25.2 (L) 06/07/2021   Lab Results  Component Value Date   WBC 10.9 (H) 04/05/2020   HGB 13.0 04/05/2020   HCT 39.1 04/05/2020   MCV 84.8 04/05/2020   PLT 322 04/05/2020     Attestation Statements:   Reviewed by clinician on day of visit: allergies, medications, problem list, medical history, surgical history, family history, social history, and previous encounter notes.  Time spent on visit including pre-visit chart review and post-visit care and charting was 20 minutes.   I, 04/07/2020, BS, CMA, am acting as transcriptionist for Kyung Rudd, MD.  I have reviewed the above documentation for accuracy and completeness, and I agree with the above. -Worthy Rancher, MD

## 2022-02-11 NOTE — Progress Notes (Signed)
Not seen; erroneous encounter 

## 2022-02-12 ENCOUNTER — Ambulatory Visit (INDEPENDENT_AMBULATORY_CARE_PROVIDER_SITE_OTHER): Payer: Medicaid Other | Admitting: Internal Medicine

## 2022-02-12 ENCOUNTER — Encounter (INDEPENDENT_AMBULATORY_CARE_PROVIDER_SITE_OTHER): Payer: Self-pay | Admitting: Internal Medicine

## 2022-02-12 VITALS — BP 120/81 | HR 69 | Temp 97.9°F | Ht 68.0 in | Wt 292.4 lb

## 2022-02-12 DIAGNOSIS — Z6841 Body Mass Index (BMI) 40.0 and over, adult: Secondary | ICD-10-CM

## 2022-02-12 DIAGNOSIS — F419 Anxiety disorder, unspecified: Secondary | ICD-10-CM | POA: Diagnosis not present

## 2022-02-12 DIAGNOSIS — R7303 Prediabetes: Secondary | ICD-10-CM | POA: Diagnosis not present

## 2022-02-12 DIAGNOSIS — E669 Obesity, unspecified: Secondary | ICD-10-CM | POA: Diagnosis not present

## 2022-02-12 DIAGNOSIS — R632 Polyphagia: Secondary | ICD-10-CM | POA: Diagnosis not present

## 2022-02-12 MED ORDER — SERTRALINE HCL 50 MG PO TABS: 50.0000 mg | ORAL_TABLET | Freq: Every day | ORAL | 0 refills | Status: DC

## 2022-02-12 NOTE — Progress Notes (Signed)
Chief Complaint:   OBESITY Cindy Dixon is here to discuss her progress with her obesity treatment plan along with follow-up of her obesity related diagnoses. Cindy Dixon is on the Category 3 Plan and states she is following her eating plan approximately 50% of the time. Cindy Dixon states she is walking for 30 minutes 5 times per week.  Today's visit was #: 30 Starting weight: 321 lbs Starting date: 10/11/2019 Today's weight: 292 lbs Today's date: 02/12/2022 Total lbs lost to date: 29 Total lbs lost since last in-office visit: 0  Interim History: Cindy Dixon presents today for follow-up.  Since last office visit she has increased 3 pounds.  She acknowledges dietary indiscretion during the holidays.  She reports eating larger portions and highly palatable foods.  She has now started to get back on track and feels pretty confident about her ability to do this.  She continues to have problems obtaining prescription drugs.  She states they did not dispense sertraline even though she had a paper prescription.  She has limited [place] proficiency so has a hard time navigating healthcare.  She denies consumption of liquid calories.  She will be traveling end of this month.  Subjective:   1. Polyphagia Multifactorial she has anxiety and insulin resistance.  2. Anxiety Unchanged. Was not able to obtain setraline, reasons unknown.   3. Pre-diabetes Associated with polyphagia. On metfomrin without SE.  Assessment/Plan:   1. Polyphagia Patient counseled on behavioral approaches.  She will continue metformin at current dose.  No side effects reported.  - sertraline (ZOLOFT) 50 MG tablet; Take 1 tablet (50 mg total) by mouth daily.  Dispense: 30 tablet; Refill: 0  2. Anxiety Given rx again, will take to [facility] as they have bilingual staff.   - sertraline (ZOLOFT) 50 MG tablet; Take 1 tablet (50 mg total) by mouth daily.  Dispense: 30 tablet; Refill: 0  3. Pre-diabetes Continue WLT,  counseled on reducing simple carbs. Continue metformin for diabetes prevention.   4. Obesity, with current BMI of 44.5 Cindy Dixon is currently in the action stage of change. As such, her goal is to continue with weight loss efforts. She has agreed to the Category 3 Plan.   Exercise goals: As is.   Behavioral modification strategies: increasing lean protein intake, decreasing simple carbohydrates, increasing water intake, increasing high fiber foods, no skipping meals, better snacking choices, avoiding temptations, and planning for success.  Cindy Dixon has agreed to follow-up with our clinic in 3 weeks. She was informed of the importance of frequent follow-up visits to maximize her success with intensive lifestyle modifications for her multiple health conditions.   Objective:   Blood pressure 120/81, pulse 69, temperature 97.9 F (36.6 C), height 5\' 8"  (1.727 m), weight 292 lb 6.4 oz (132.6 kg), SpO2 95 %. Body mass index is 44.46 kg/m.  General: Cooperative, alert, well developed, in no acute distress. HEENT: Conjunctivae and lids unremarkable. Cardiovascular: Regular rhythm.  Lungs: Normal work of breathing. Neurologic: No focal deficits.   Lab Results  Component Value Date   CREATININE 0.66 07/17/2021   BUN 10 07/17/2021   NA 138 07/17/2021   K 4.5 07/17/2021   CL 101 07/17/2021   CO2 23 07/17/2021   Lab Results  Component Value Date   ALT 17 07/17/2021   AST 15 07/17/2021   ALKPHOS 72 07/17/2021   BILITOT 0.4 07/17/2021   Lab Results  Component Value Date   HGBA1C 5.5 11/20/2021   HGBA1C 5.5 07/17/2021   HGBA1C 5.8 (  H) 04/17/2020   HGBA1C 6.0 (H) 10/11/2019   HGBA1C 5.7 (H) 11/12/2018   Lab Results  Component Value Date   INSULIN 20.5 07/17/2021   INSULIN 25.8 (H) 04/17/2020   INSULIN 34.1 (H) 10/11/2019   Lab Results  Component Value Date   TSH 2.540 10/11/2019   Lab Results  Component Value Date   CHOL 172 10/11/2019   HDL 36 (L) 10/11/2019   LDLCALC  108 (H) 10/11/2019   TRIG 155 (H) 10/11/2019   CHOLHDL 4.0 11/12/2018   Lab Results  Component Value Date   VD25OH 25.2 (L) 11/20/2021   VD25OH 28.4 (L) 07/17/2021   VD25OH 25.2 (L) 06/07/2021   Lab Results  Component Value Date   WBC 10.9 (H) 04/05/2020   HGB 13.0 04/05/2020   HCT 39.1 04/05/2020   MCV 84.8 04/05/2020   PLT 322 04/05/2020   No results found for: "IRON", "TIBC", "FERRITIN"  Attestation Statements:   Reviewed by clinician on day of visit: allergies, medications, problem list, medical history, surgical history, family history, social history, and previous encounter notes.   Wilhemena Durie, am acting as transcriptionist for Thomes Dinning, MD.  I have reviewed the above documentation for accuracy and completeness, and I agree with the above. -Thomes Dinning, MD

## 2022-03-06 ENCOUNTER — Encounter (INDEPENDENT_AMBULATORY_CARE_PROVIDER_SITE_OTHER): Payer: Self-pay | Admitting: Internal Medicine

## 2022-03-06 ENCOUNTER — Ambulatory Visit (INDEPENDENT_AMBULATORY_CARE_PROVIDER_SITE_OTHER): Payer: Medicaid Other | Admitting: Internal Medicine

## 2022-03-06 VITALS — BP 115/76 | Temp 98.1°F | Ht 68.0 in

## 2022-03-06 DIAGNOSIS — F419 Anxiety disorder, unspecified: Secondary | ICD-10-CM

## 2022-03-06 DIAGNOSIS — E669 Obesity, unspecified: Secondary | ICD-10-CM

## 2022-03-06 DIAGNOSIS — E559 Vitamin D deficiency, unspecified: Secondary | ICD-10-CM | POA: Diagnosis not present

## 2022-03-06 DIAGNOSIS — K219 Gastro-esophageal reflux disease without esophagitis: Secondary | ICD-10-CM

## 2022-03-06 DIAGNOSIS — Z6841 Body Mass Index (BMI) 40.0 and over, adult: Secondary | ICD-10-CM | POA: Diagnosis not present

## 2022-03-06 MED ORDER — CHOLECALCIFEROL 1.25 MG (50000 UT) PO TABS: ORAL_TABLET | ORAL | 0 refills | Status: DC

## 2022-03-06 MED ORDER — PANTOPRAZOLE SODIUM 40 MG PO TBEC: 40.0000 mg | DELAYED_RELEASE_TABLET | Freq: Every day | ORAL | 0 refills | Status: DC

## 2022-03-06 NOTE — Assessment & Plan Note (Signed)
Improved.  She has only been taking 25 mg of sertraline as she feels the 50 mg gives her heartburn.  She will continue current medication.  This is also being used to assist with emotional eating.

## 2022-03-06 NOTE — Assessment & Plan Note (Signed)
I reviewed records as patient is relatively new to me and does not have a primary care physician.  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  She continues to request refills on pantoprazole but has never been on a TCA.  She also has not followed up with gastroenterology and this is likely why.  I do not have this information at the time of appointment.  We discussed environmental measures.  We are also working on the management of her anxiety.  She will continue with PPI, medication refilled today, until seen by either GI or myself to discuss the etiology of her discomfort.

## 2022-03-06 NOTE — Progress Notes (Unsigned)
Chief Complaint:   OBESITY Cindy Dixon is here to discuss her progress with her obesity treatment plan along with follow-up of her obesity related diagnoses. Cindy Dixon is on the Category 3 Plan and states she is following her eating plan approximately 50% of the time. Cindy Dixon states she is walking for 30 minutes 5 times per week.  Today's visit was #: 50 Starting weight: 321 lbs Starting date: 10/11/2019 Today's weight: 289 lbs Today's date: 03/06/2022 Total lbs lost to date: 32 Total lbs lost since last in-office visit: 3  Interim History: Cindy Dixon presents today for follow-up.  Since last office visit she has lost 3 pounds.  She reports good adherence to nutritional plan but upon 24-hour recall she is really not getting enough protein and is also skipping lunch most days.  She has eliminated soda's from her diet and denies snacking on calorie dense foods.  She denies abnormal cravings, increased hunger signals or lack of satiety.  She finds metformin and centrally helpful.  Her anxiety has also improved.  She continues to struggle with symptoms of acid reflux.  Denies any red flag symptoms.  Has had an EGD in the past.  Subjective:   1. Anxiety Improved.  She has only been taking 25 mg of sertraline as she feels the 50 mg gives her heartburn.    2. Gastroesophageal reflux disease, unspecified whether esophagitis present She had an EGD in the past that showed a 1 cm hiatal hernia.  She had normal esophageal motility study in April 2022 and also pH probe.  This suggested the possibility of esophageal hypersensitivity but has never been treated with a TCA.  3. Vitamin D deficiency Associated with excess adiposity and may result in leptin resistance and adipogenesis.  Currently on high-dose vitamin D supplementation without any adverse effects.    Assessment/Plan:   1. Anxiety She will continue current medication.  This is also being used to assist with emotional eating.  2.  Gastroesophageal reflux disease, unspecified whether esophagitis present I reviewed records as patient is relatively new to me and does not have a primary care physician.  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  She continues to request refills on pantoprazole but has never been on a TCA.  She also has not followed up with gastroenterology and this is likely why.  I do not have this information at the time of appointment.  We discussed environmental measures.  We are also working on the management of her anxiety.  She will continue with PPI, medication refilled today, until seen by either GI or myself to discuss the etiology of her discomfort.  - pantoprazole (PROTONIX) 40 MG tablet; Take 1 tablet (40 mg total) by mouth daily.  Dispense: 90 tablet; Refill: 0  3. Vitamin D deficiency We will check levels in 3 months.  - Cholecalciferol 1.25 MG (50000 UT) TABS; Take one capsule twice per week  Dispense: 12 tablet; Refill: 0  4. Obesity, current BMI 44.1 We again reviewed the components of her weight loss plan.  She is not getting adequate nutrition.  She was counseled on breakfast options and ways to increase protein intake in the morning.  She was also given lunch ideas as has been skipping lunch.  She will consider monitoring her steps.  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  I anticipate she is getting an adequate amount of NEAT.  Patient also advised to find  a PCP so she could be updated on age and gender specific preventive measures.  Cindy Dixon is currently in the action stage of change. As such, her goal is to continue with weight loss efforts. She has agreed to the Category 3 Plan.   Exercise goals: As is.   Behavioral modification strategies: increasing lean protein intake, increasing vegetables, no skipping meals, meal planning and cooking strategies, and planning for success.  Cindy Dixon has agreed to  follow-up with our clinic in 3 weeks. She was informed of the importance of frequent follow-up visits to maximize her success with intensive lifestyle modifications for her multiple health conditions.   Objective:   Blood pressure 115/76, temperature 98.1 F (36.7 C), height 5\' 8"  (1.727 m), SpO2 97 %. Body mass index is 44.46 kg/m.  General: Cooperative, alert, well developed, in no acute distress. HEENT: Conjunctivae and lids unremarkable. Cardiovascular: Regular rhythm.  Lungs: Normal work of breathing. Neurologic: No focal deficits.   Lab Results  Component Value Date   CREATININE 0.66 07/17/2021   BUN 10 07/17/2021   NA 138 07/17/2021   K 4.5 07/17/2021   CL 101 07/17/2021   CO2 23 07/17/2021   Lab Results  Component Value Date   ALT 17 07/17/2021   AST 15 07/17/2021   ALKPHOS 72 07/17/2021   BILITOT 0.4 07/17/2021   Lab Results  Component Value Date   HGBA1C 5.5 11/20/2021   HGBA1C 5.5 07/17/2021   HGBA1C 5.8 (H) 04/17/2020   HGBA1C 6.0 (H) 10/11/2019   HGBA1C 5.7 (H) 11/12/2018   Lab Results  Component Value Date   INSULIN 20.5 07/17/2021   INSULIN 25.8 (H) 04/17/2020   INSULIN 34.1 (H) 10/11/2019   Lab Results  Component Value Date   TSH 2.540 10/11/2019   Lab Results  Component Value Date   CHOL 172 10/11/2019   HDL 36 (L) 10/11/2019   LDLCALC 108 (H) 10/11/2019   TRIG 155 (H) 10/11/2019   CHOLHDL 4.0 11/12/2018   Lab Results  Component Value Date   VD25OH 25.2 (L) 11/20/2021   VD25OH 28.4 (L) 07/17/2021   VD25OH 25.2 (L) 06/07/2021   Lab Results  Component Value Date   WBC 10.9 (H) 04/05/2020   HGB 13.0 04/05/2020   HCT 39.1 04/05/2020   MCV 84.8 04/05/2020   PLT 322 04/05/2020   No results found for: "IRON", "TIBC", "FERRITIN"  Attestation Statements:   Reviewed by clinician on day of visit: allergies, medications, problem list, medical history, surgical history, family history, social history, and previous encounter notes.   Wilhemena Durie, am acting as transcriptionist for Thomes Dinning, MD.  I have reviewed the above documentation for accuracy and completeness, and I agree with the above. -Thomes Dinning, MD

## 2022-03-06 NOTE — Assessment & Plan Note (Signed)
Associated with excess adiposity and may result in leptin resistance and adipogenesis.  Currently on high-dose vitamin D supplementation without any adverse effects.  We will check levels in 3 months

## 2022-03-27 ENCOUNTER — Encounter (INDEPENDENT_AMBULATORY_CARE_PROVIDER_SITE_OTHER): Payer: Self-pay | Admitting: Internal Medicine

## 2022-03-27 ENCOUNTER — Ambulatory Visit (INDEPENDENT_AMBULATORY_CARE_PROVIDER_SITE_OTHER): Payer: Medicaid Other | Admitting: Internal Medicine

## 2022-03-27 VITALS — BP 117/81 | HR 72 | Temp 98.2°F | Ht 68.0 in | Wt 291.0 lb

## 2022-03-27 DIAGNOSIS — Z6841 Body Mass Index (BMI) 40.0 and over, adult: Secondary | ICD-10-CM | POA: Diagnosis not present

## 2022-03-27 DIAGNOSIS — K219 Gastro-esophageal reflux disease without esophagitis: Secondary | ICD-10-CM | POA: Diagnosis not present

## 2022-03-27 DIAGNOSIS — E669 Obesity, unspecified: Secondary | ICD-10-CM

## 2022-03-27 DIAGNOSIS — R1013 Epigastric pain: Secondary | ICD-10-CM | POA: Diagnosis not present

## 2022-03-27 MED ORDER — PANTOPRAZOLE SODIUM 40 MG PO TBEC: 40.0000 mg | DELAYED_RELEASE_TABLET | Freq: Every day | ORAL | 0 refills | Status: DC

## 2022-03-27 NOTE — Progress Notes (Signed)
Office: [phone]  /  Fax: [facility] Height: 5' 8"$  (1.727 m) Weight: 291 lb (132 kg) Temp: 98.2 F (36.8 C) Pulse Rate: 72 BP: 117/81 SpO2: 99 % Fasting: n Labs: n Today's Visit #: 33 Weight at Last VIsit: 289 lb Weight Lost Since Last Visit: +2  Body Fat %: 50.2 % Fat Mass (lbs): 146.2 lbs Muscle Mass (lbs): 137.8 lbs Total Body Water (lbs): 1028 lbs Visceral Fat Rating : 14 Starting Date: 10/11/19 Starting Weight: 321 lb Total Weight Loss (lbs): 30 lb (13.6 kg)    HPI  Chief Complaint: OBESITY  Cindy Dixon is here to discuss her progress with her obesity treatment plan. She is on the the Category 3 Plan and states she is following her eating plan approximately 50 % of the time. She states she is exercising 30 minutes 5 times per week.   Interval History:  Since last office visit she has gained 2 pounds.  She has not been feeling well she reports having abdominal distention and epigastric pain for the last several weeks.  No nausea vomiting or diarrhea.  She thinks it may be related to the increase in sertraline.  She is also on metformin 1000 mg twice a day but does not feel it is related to this medication.  She is been trying to find a PCP but has not been able to find one yet.  She has run out of PPI.  She has been taking NSAIDs for molar pain.   Pharmacotherapy: Metformin twice a day  PHYSICAL EXAM:  Blood pressure 117/81, pulse 72, temperature 98.2 F (36.8 C), height 5' 8"$  (1.727 m), weight 291 lb (132 kg), SpO2 99 %. Body mass index is 44.25 kg/m.  General: She is overweight, cooperative, alert, well developed, and in no acute distress. PSYCH: Has normal mood, affect and thought process.   HEENT: EOMI, sclerae are anicteric. Lungs: Normal breathing effort, no conversational dyspnea. Abdomen: Central adiposity is noted limiting exam.  There is some tenderness in the epigastric region without any  rebound or guarding.  There are no palpable masses.  Negative Murphy Extremities: No edema.  Neurologic: No gross sensory or motor deficits. No tremors or fasciculations noted.    DIAGNOSTIC DATA REVIEWED:  BMET    Component Value Date/Time   NA 138 07/17/2021 1033   K 4.5 07/17/2021 1033   CL 101 07/17/2021 1033   CO2 23 07/17/2021 1033   GLUCOSE 81 07/17/2021 1033   GLUCOSE 115 (H) 04/05/2020 1632   BUN 10 07/17/2021 1033   CREATININE 0.66 07/17/2021 1033   CALCIUM 9.5 07/17/2021 1033   GFRNONAA >60 04/05/2020 1632   GFRAA 137 10/11/2019 1040   Lab Results  Component Value Date   HGBA1C 5.5 11/20/2021   HGBA1C 5.7 (H) 11/12/2018   Lab Results  Component Value Date   INSULIN 20.5 07/17/2021   INSULIN 34.1 (H) 10/11/2019   Lab Results  Component Value Date   TSH 2.540 10/11/2019   CBC    Component Value Date/Time   WBC 10.9 (H) 04/05/2020 1632   RBC 4.61 04/05/2020 1632   HGB 13.0 04/05/2020 1632   HGB 14.3 10/11/2019 1040   HCT 39.1 04/05/2020 1632   HCT 43.9 10/11/2019 1040   PLT 322 04/05/2020 1632   PLT 319 10/11/2019 1040   MCV 84.8 04/05/2020 1632   MCV 84 10/11/2019 1040   MCH 28.2 04/05/2020 1632   MCHC 33.2 04/05/2020 1632  RDW 13.5 04/05/2020 1632   RDW 13.8 10/11/2019 1040   Iron Studies No results found for: "IRON", "TIBC", "FERRITIN", "IRONPCTSAT" Lipid Panel     Component Value Date/Time   CHOL 172 10/11/2019 1040   TRIG 155 (H) 10/11/2019 1040   HDL 36 (L) 10/11/2019 1040   CHOLHDL 4.0 11/12/2018 1508   LDLCALC 108 (H) 10/11/2019 1040   Hepatic Function Panel     Component Value Date/Time   PROT 7.4 07/17/2021 1033   ALBUMIN 4.7 07/17/2021 1033   AST 15 07/17/2021 1033   ALT 17 07/17/2021 1033   ALKPHOS 72 07/17/2021 1033   BILITOT 0.4 07/17/2021 1033      Component Value Date/Time   TSH 2.540 10/11/2019 1040   Nutritional Lab Results  Component Value Date   VD25OH 25.2 (L) 11/20/2021   VD25OH 28.4 (L) 07/17/2021    VD25OH 25.2 (L) 06/07/2021     ASSESSMENT AND PLAN  TREATMENT PLAN FOR OBESITY:  Recommended Dietary Goals  Cindy Dixon is currently in the action stage of change. As such, her goal is to continue weight management plan. She has agreed to the Category 3 Plan.  Behavioral Intervention  We discussed the following Behavioral Modification Strategies today: increasing lean protein intake and increase water intake.  Additional resources provided today: NA  Recommended Physical Activity Goals  Cindy Dixon has been advised to work up to 150 minutes of moderate intensity aerobic activity a week and strengthening exercises 2-3 times per week for cardiovascular health, weight loss maintenance and preservation of muscle mass.   She has agreed to increase physical activity in their day and reduce sedentary time (increase NEAT).    Pharmacotherapy We discussed various medication options to help Cindy Dixon with her weight loss efforts and we both agreed to continue metformin twice a day.  ASSOCIATED CONDITIONS ADDRESSED TODAY  Epigastric pain Assessment & Plan: She will reduce sertraline to 25 mg once a day.  She will also start PPI and avoid NSAIDs.  Differential includes gastritis, PUD, hiatal hernia with esophagitis, she is also at risk for gallstones because of her weight loss.  I have encouraged her multiple times to find a primary care physician and tells me is in the process of doing that.  We will check a CMP, lipase as well as lactic acid as she is on metformin.  She has had a tubal ligation but because she is of reproductive age we will check a beta hCG.  Patient also advised that she develops severe abdominal pain with fever nausea vomiting or bloody stools she is to go to the emergency room.  Orders: -     CMP14+EGFR -     Lipase, blood -     Lactic acid, plasma -     hCG, quantitative, pregnancy -     CBC with Differential/Platelet  Gastroesophageal reflux disease, unspecified  whether esophagitis present -     Pantoprazole Sodium; Take 1 tablet (40 mg total) by mouth daily.  Dispense: 90 tablet; Refill: 0      Return in about 2 weeks (around 04/10/2022) for For Weight Mangement with Dr. Gerarda Fraction, patient will be coming for labs tomorrow AM as lab closed.Marland Kitchen She was informed of the importance of frequent follow up visits to maximize her success with intensive lifestyle modifications for her multiple health conditions.   ATTESTASTION STATEMENTS:  Reviewed by clinician on day of visit: allergies, medications, problem list, medical history, surgical history, family history, social history, and previous encounter notes.  Time spent on visit including pre-visit chart review and post-visit care and charting was 30 minutes.    Thomes Dinning, MD

## 2022-03-27 NOTE — Assessment & Plan Note (Signed)
She will reduce sertraline to 25 mg once a day.  She will also start PPI and avoid NSAIDs.  Differential includes gastritis, PUD, hiatal hernia with esophagitis, she is also at risk for gallstones because of her weight loss.  I have encouraged her multiple times to find a primary care physician and tells me is in the process of doing that.  We will check a CMP, lipase as well as lactic acid as she is on metformin.  She has had a tubal ligation but because she is of reproductive age we will check a beta hCG.  Patient also advised that she develops severe abdominal pain with fever nausea vomiting or bloody stools she is to go to the emergency room.

## 2022-03-28 DIAGNOSIS — R1084 Generalized abdominal pain: Secondary | ICD-10-CM | POA: Diagnosis not present

## 2022-03-29 LAB — CBC WITH DIFFERENTIAL/PLATELET
Basophils Absolute: 0 10*3/uL (ref 0.0–0.2)
Basos: 1 %
EOS (ABSOLUTE): 0.2 10*3/uL (ref 0.0–0.4)
Eos: 2 %
Hematocrit: 41.2 % (ref 34.0–46.6)
Hemoglobin: 13.5 g/dL (ref 11.1–15.9)
Immature Grans (Abs): 0 10*3/uL (ref 0.0–0.1)
Immature Granulocytes: 0 %
Lymphocytes Absolute: 2.8 10*3/uL (ref 0.7–3.1)
Lymphs: 31 %
MCH: 27.4 pg (ref 26.6–33.0)
MCHC: 32.8 g/dL (ref 31.5–35.7)
MCV: 84 fL (ref 79–97)
Monocytes Absolute: 0.5 10*3/uL (ref 0.1–0.9)
Monocytes: 5 %
Neutrophils Absolute: 5.4 10*3/uL (ref 1.4–7.0)
Neutrophils: 61 %
Platelets: 319 10*3/uL (ref 150–450)
RBC: 4.93 x10E6/uL (ref 3.77–5.28)
RDW: 13.4 % (ref 11.7–15.4)
WBC: 8.8 10*3/uL (ref 3.4–10.8)

## 2022-03-29 LAB — LACTIC ACID, PLASMA: Lactate, Ven: 11.4 mg/dL (ref 4.8–25.7)

## 2022-03-29 LAB — CMP14+EGFR
ALT: 19 IU/L (ref 0–32)
AST: 16 IU/L (ref 0–40)
Albumin/Globulin Ratio: 1.6 (ref 1.2–2.2)
Albumin: 4.4 g/dL (ref 3.9–4.9)
Alkaline Phosphatase: 73 IU/L (ref 44–121)
BUN/Creatinine Ratio: 21 (ref 9–23)
BUN: 13 mg/dL (ref 6–20)
Bilirubin Total: <0.2 mg/dL (ref 0.0–1.2)
CO2: 23 mmol/L (ref 20–29)
Calcium: 9.2 mg/dL (ref 8.7–10.2)
Chloride: 103 mmol/L (ref 96–106)
Creatinine, Ser: 0.61 mg/dL (ref 0.57–1.00)
Globulin, Total: 2.7 g/dL (ref 1.5–4.5)
Glucose: 90 mg/dL (ref 70–99)
Potassium: 4.6 mmol/L (ref 3.5–5.2)
Sodium: 139 mmol/L (ref 134–144)
Total Protein: 7.1 g/dL (ref 6.0–8.5)
eGFR: 121 mL/min/{1.73_m2} (ref >59)

## 2022-04-11 ENCOUNTER — Ambulatory Visit (INDEPENDENT_AMBULATORY_CARE_PROVIDER_SITE_OTHER): Payer: Medicaid Other | Admitting: Internal Medicine

## 2022-04-11 ENCOUNTER — Encounter (INDEPENDENT_AMBULATORY_CARE_PROVIDER_SITE_OTHER): Payer: Self-pay | Admitting: Internal Medicine

## 2022-04-11 VITALS — BP 125/84 | HR 76 | Temp 98.3°F | Ht 68.0 in | Wt 290.0 lb

## 2022-04-11 DIAGNOSIS — R632 Polyphagia: Secondary | ICD-10-CM | POA: Insufficient documentation

## 2022-04-11 DIAGNOSIS — Z6841 Body Mass Index (BMI) 40.0 and over, adult: Secondary | ICD-10-CM

## 2022-04-11 DIAGNOSIS — F419 Anxiety disorder, unspecified: Secondary | ICD-10-CM

## 2022-04-11 DIAGNOSIS — R7303 Prediabetes: Secondary | ICD-10-CM

## 2022-04-11 DIAGNOSIS — E669 Obesity, unspecified: Secondary | ICD-10-CM | POA: Diagnosis not present

## 2022-04-11 DIAGNOSIS — K219 Gastro-esophageal reflux disease without esophagitis: Secondary | ICD-10-CM | POA: Diagnosis not present

## 2022-04-11 MED ORDER — ESCITALOPRAM OXALATE 10 MG PO TABS: 10.0000 mg | ORAL_TABLET | Freq: Every day | ORAL | 0 refills | Status: DC

## 2022-04-11 MED ORDER — SERTRALINE HCL 25 MG PO TABS: 25.0000 mg | ORAL_TABLET | Freq: Every day | ORAL | 0 refills | Status: DC

## 2022-04-11 NOTE — Assessment & Plan Note (Signed)
Improved on PPI.  Continue current dose for now considering reduction in the future.  We need to monitor B12 levels as she is also on metformin.

## 2022-04-11 NOTE — Assessment & Plan Note (Signed)
Neurohormonal and psychological.  She has high levels of stress and anxiety which drive some of her eating patterns.  She also has insulin resistance.  We are starting a new SSRI.  She will continue metformin.

## 2022-04-11 NOTE — Progress Notes (Signed)
Office: [phone]  /  Fax: [phone]  WEIGHT SUMMARY AND BIOMETRICS  Vitals Temp: 98.3 F (36.8 C) BP: 125/84 Pulse Rate: 76 SpO2: 96 %   Anthropometric Measurements Height: '5\' 8"'$  (1.727 m) Weight: 290 lb (131.5 kg) BMI (Calculated): 44.1 Weight at Last Visit: 291 lb Weight Lost Since Last Visit: 1 lb Starting Weight: 321 lb Total Weight Loss (lbs): 31 lb (14.1 kg)   Body Composition  Body Fat %: 49.2 % Fat Mass (lbs): 143 lbs Muscle Mass (lbs): 140.4 lbs Total Body Water (lbs): 100.2 lbs Visceral Fat Rating : 14   Other Clinical Data Fasting: No Labs: No Today's Visit #: 17    HPI  Chief Complaint: OBESITY  Cindy Dixon is here to discuss her progress with her obesity treatment plan. She is on the the Category 3 Plan and states she is following her eating plan approximately 20 % of the time.  Interval History:  Since last office visit she has has lost 1 pound. She reports suboptimal adherence to prescribed reduced calorie nutrition plan due to death in the family and high levels of stress.  This has resulted in cravings and eating sugary foods which her daughter purchases. '[]'$ Denies '[x]'$ Reports problems with appetite and hunger signals.  '[x]'$ Denies '[]'$ Reports problems with satiety and satiation.  '[x]'$ Denies '[]'$ Reports problems with eating patterns and portion control.  '[]'$ Denies '[x]'$ Reports abnormal cravings Sleeping approximately 6 hours a day.  Sleep described as: '[x]'$ Restorative '[]'$ Unrestorative '[]'$ Interrupted Stress levels are reported as high and due to Family.   She has found a primary care physician   Pharmacotherapy for weight loss: She is currently taking Metformin (off label use for incretin effect and / or insulin resistance and / or diabetes prevention) .   Weight promoting medications identified: None.  ASSESSMENT AND PLAN  TREATMENT PLAN FOR OBESITY:  Recommended Dietary Goals  Cindy Dixon is currently in the action stage of change. As  such, her goal is to continue weight management plan. She has agreed to: the Category 3 Plan.  Behavioral Intervention  We discussed the following Behavioral Modification Strategies today: increasing lean protein intake, decreasing simple carbohydrates , increasing vegetables, increasing fiber rich foods, avoiding skipping meals, increasing water intake, work on meal planning and easy cooking plans, work on tracking and journaling calories using tracking App, decreasing eating out, consumption of processed foods, and making healthy choices when eating convenient foods, reading food labels , emotional eating strategies and understanding the difference between hunger signals and cravings, and work on managing stress, creating time for self-care and relaxation measures.  Additional resources provided today: NA  Recommended Physical Activity Goals  Cindy Dixon has been advised to work up to 150 minutes of moderate intensity aerobic activity a week and strengthening exercises 2-3 times per week for cardiovascular health, weight loss maintenance and preservation of muscle mass.    Pharmacotherapy We discussed various medication options to help Cindy Dixon with her weight loss efforts and we both agreed to : '[]'$  Continue with nutritional and behavioral strategies '[x]'$  Continue current antiobesity medication (AOM). '[]'$ Start AOM   ASSOCIATED CONDITIONS ADDRESSED TODAY  Obesity, current BMI 44.1  Anxiety Assessment & Plan: Her anxiety levels have recently increased.  She did not tolerate a higher dose of sertraline due to GI side effects.  We will discontinue sertraline and start Lexapro 10 mg once daily.  She has responded favorably to SSRIs.  She will be establishing care with a primary care physician in the near future.  We will continue to support  her in the meantime.  Orders: -     Escitalopram Oxalate; Take 1 tablet (10 mg total) by mouth daily.  Dispense: 30 tablet; Refill:  0  Polyphagia Assessment & Plan: Neurohormonal and psychological.  She has high levels of stress and anxiety which drive some of her eating patterns.  She also has insulin resistance.  We are starting a new SSRI.  She will continue metformin.  Orders: -     Escitalopram Oxalate; Take 1 tablet (10 mg total) by mouth daily.  Dispense: 30 tablet; Refill: 0  Gastroesophageal reflux disease, unspecified whether esophagitis present Assessment & Plan: Improved on PPI.  Continue current dose for now considering reduction in the future.  We need to monitor B12 levels as she is also on metformin.   Pre-diabetes Assessment & Plan: On metformin without any adverse effects.  She will continue current regimen as well as nutrition strategies for weight loss.      PHYSICAL EXAM:  Blood pressure 125/84, pulse 76, temperature 98.3 F (36.8 C), height '5\' 8"'$  (1.727 m), weight 290 lb (131.5 kg), SpO2 96 %. Body mass index is 44.09 kg/m.  General: She is overweight, cooperative, alert, well developed, and in no acute distress. PSYCH: Has normal mood, affect and thought process.   HEENT: EOMI, sclerae are anicteric. Lungs: Normal breathing effort, no conversational dyspnea. Extremities: No edema.  Neurologic: No gross sensory or motor deficits. No tremors or fasciculations noted.    DIAGNOSTIC DATA REVIEWED:  BMET    Component Value Date/Time   NA 139 03/28/2022 0829   K 4.6 03/28/2022 0829   CL 103 03/28/2022 0829   CO2 23 03/28/2022 0829   GLUCOSE 90 03/28/2022 0829   GLUCOSE 115 (H) 04/05/2020 1632   BUN 13 03/28/2022 0829   CREATININE 0.61 03/28/2022 0829   CALCIUM 9.2 03/28/2022 0829   GFRNONAA >60 04/05/2020 1632   GFRAA 137 10/11/2019 1040   Lab Results  Component Value Date   HGBA1C 5.5 11/20/2021   HGBA1C 5.7 (H) 11/12/2018   Lab Results  Component Value Date   INSULIN 20.5 07/17/2021   INSULIN 34.1 (H) 10/11/2019   Lab Results  Component Value Date   TSH 2.540  10/11/2019   CBC    Component Value Date/Time   WBC 8.8 03/28/2022 0829   WBC 10.9 (H) 04/05/2020 1632   RBC 4.93 03/28/2022 0829   RBC 4.61 04/05/2020 1632   HGB 13.5 03/28/2022 0829   HCT 41.2 03/28/2022 0829   PLT 319 03/28/2022 0829   MCV 84 03/28/2022 0829   MCH 27.4 03/28/2022 0829   MCH 28.2 04/05/2020 1632   MCHC 32.8 03/28/2022 0829   MCHC 33.2 04/05/2020 1632   RDW 13.4 03/28/2022 0829   Iron Studies No results found for: "IRON", "TIBC", "FERRITIN", "IRONPCTSAT" Lipid Panel     Component Value Date/Time   CHOL 172 10/11/2019 1040   TRIG 155 (H) 10/11/2019 1040   HDL 36 (L) 10/11/2019 1040   CHOLHDL 4.0 11/12/2018 1508   LDLCALC 108 (H) 10/11/2019 1040   Hepatic Function Panel     Component Value Date/Time   PROT 7.1 03/28/2022 0829   ALBUMIN 4.4 03/28/2022 0829   AST 16 03/28/2022 0829   ALT 19 03/28/2022 0829   ALKPHOS 73 03/28/2022 0829   BILITOT <0.2 03/28/2022 0829      Component Value Date/Time   TSH 2.540 10/11/2019 1040   Nutritional Lab Results  Component Value Date   VD25OH 25.2 (L) 11/20/2021  VD25OH 28.4 (L) 07/17/2021   VD25OH 25.2 (L) 06/07/2021     Return in about 2 weeks (around 04/25/2022) for For Weight Mangement with Dr. Gerarda Fraction.Marland Kitchen She was informed of the importance of frequent follow up visits to maximize her success with intensive lifestyle modifications for her multiple health conditions.   ATTESTASTION STATEMENTS:  Reviewed by clinician on day of visit: allergies, medications, problem list, medical history, surgical history, family history, social history, and previous encounter notes.   Time spent on visit including pre-visit chart review and post-visit care and charting was 30 minutes.    Thomes Dinning, MD

## 2022-04-11 NOTE — Assessment & Plan Note (Signed)
Her anxiety levels have recently increased.  She did not tolerate a higher dose of sertraline due to GI side effects.  We will discontinue sertraline and start Lexapro 10 mg once daily.  She has responded favorably to SSRIs.  She will be establishing care with a primary care physician in the near future.  We will continue to support her in the meantime.

## 2022-04-11 NOTE — Assessment & Plan Note (Signed)
On metformin without any adverse effects.  She will continue current regimen as well as nutrition strategies for weight loss.

## 2022-04-29 ENCOUNTER — Ambulatory Visit (INDEPENDENT_AMBULATORY_CARE_PROVIDER_SITE_OTHER): Payer: Medicaid Other | Admitting: Internal Medicine

## 2022-04-29 ENCOUNTER — Encounter (INDEPENDENT_AMBULATORY_CARE_PROVIDER_SITE_OTHER): Payer: Self-pay | Admitting: Internal Medicine

## 2022-04-29 DIAGNOSIS — E669 Obesity, unspecified: Secondary | ICD-10-CM

## 2022-04-29 DIAGNOSIS — R7303 Prediabetes: Secondary | ICD-10-CM

## 2022-04-29 DIAGNOSIS — R632 Polyphagia: Secondary | ICD-10-CM

## 2022-04-29 DIAGNOSIS — Z6841 Body Mass Index (BMI) 40.0 and over, adult: Secondary | ICD-10-CM

## 2022-04-29 DIAGNOSIS — E559 Vitamin D deficiency, unspecified: Secondary | ICD-10-CM

## 2022-04-29 DIAGNOSIS — F419 Anxiety disorder, unspecified: Secondary | ICD-10-CM

## 2022-04-29 MED ORDER — ESCITALOPRAM OXALATE 10 MG PO TABS: 10.0000 mg | ORAL_TABLET | Freq: Every day | ORAL | 0 refills | Status: DC

## 2022-04-29 MED ORDER — METFORMIN HCL 1000 MG PO TABS: 1000.0000 mg | ORAL_TABLET | Freq: Two times a day (BID) | ORAL | 0 refills | Status: DC

## 2022-04-29 MED ORDER — CHOLECALCIFEROL 1.25 MG (50000 UT) PO TABS: ORAL_TABLET | ORAL | 0 refills | Status: DC

## 2022-04-29 NOTE — Assessment & Plan Note (Signed)
Most recent vitamin D levels  Lab Results  Component Value Date   VD25OH 25.2 (L) 11/20/2021   VD25OH 28.4 (L) 07/17/2021   VD25OH 25.2 (L) 06/07/2021     Deficiency state associated with adiposity and may result in leptin resistance, weight gain and fatigue. Currently on vitamin D supplementation without any adverse effects.  Plan: Adherence has been suboptimal.  She was given refills to take to a different pharmacy.

## 2022-04-29 NOTE — Assessment & Plan Note (Signed)
Improved.  Her most recent A1c was 5.5.  She is on metformin twice a day and will continue medication.  She will also work on reducing simple and added sugars in her diet.

## 2022-04-29 NOTE — Assessment & Plan Note (Signed)
Patient acknowledges having high levels of stress.  No SI or or HI.  We discussed the importance of establishing care with a behavioral health counselor.  She was provided with a list of resources.  She will get assistance from her family to make an appointment.  Language is a barrier

## 2022-04-29 NOTE — Progress Notes (Signed)
Office: [phone]  /  Fax: [phone]  WEIGHT SUMMARY AND BIOMETRICS  Vitals Temp: 98.2 F (36.8 C) BP: 129/85 Pulse Rate: 71 SpO2: 97 %   Anthropometric Measurements Height: 5\' 8"  (1.727 m) Weight: 297 lb (134.7 kg) BMI (Calculated): 45.17 Weight at Last Visit: 290 lb Weight Gained Since Last Visit: 7 lb Starting Weight: 321 lb Total Weight Loss (lbs): 24 lb (10.9 kg)   Body Composition  Body Fat %: 47.3 % Fat Mass (lbs): 140.8 lbs Muscle Mass (lbs): 149.2 lbs Total Body Water (lbs): 107 lbs Visceral Fat Rating : 14    HPI  Chief Complaint: OBESITY  Cindy Dixon is here to discuss her progress with her obesity treatment plan. She is on the the Category 3 Plan and states she is following her eating plan approximately 40 % of the time. She states she is exercising 30 minutes 5 times per week.  Interval History:  Since last office visit she has  gained 7 pounds.  She has had a lapse in nutrition and has stopped all of her medications.  She has been having a lot of family problems and high levels of anxiety.  Stopping SSRI probably did not help.  She acknowledges indulging in high calorie foods to deal with her stress.  She has a daughter who has behavioral problems.  She continues to have difficulties also obtaining refills mostly due to language barrier and low health literacy.  Pharmacotherapy for weight loss: She is currently taking Metformin (off label use for incretin effect and / or insulin resistance and / or diabetes prevention) .    ASSESSMENT AND PLAN  TREATMENT PLAN FOR OBESITY:  Recommended Dietary Goals  Cindy Dixon is currently in the action stage of change. As such, her goal is to continue weight management plan. She has agreed to: the Category 3 Plan.  Behavioral Intervention  We discussed the following Behavioral Modification Strategies today: decreasing eating out, consumption of processed foods, and making healthy choices when eating  convenient foods and work on managing stress, creating time for self-care and relaxation measures.  Additional resources provided today:  Handout on local behavioral health resources.  She needs an interpreter  Recommended Physical Activity Goals  Cindy Dixon has been advised to work up to 150 minutes of moderate intensity aerobic activity a week and strengthening exercises 2-3 times per week for cardiovascular health, weight loss maintenance and preservation of muscle mass.   She has agreed to :  Think about ways to increase physical activity  Pharmacotherapy We discussed various medication options to help Cindy Dixon with her weight loss efforts and we both agreed to : continue current anti-obesity medication regimen  ASSOCIATED CONDITIONS ADDRESSED TODAY  Vitamin D deficiency Assessment & Plan: Most recent vitamin D levels  Lab Results  Component Value Date   VD25OH 25.2 (L) 11/20/2021   VD25OH 28.4 (L) 07/17/2021   VD25OH 25.2 (L) 06/07/2021     Deficiency state associated with adiposity and may result in leptin resistance, weight gain and fatigue. Currently on vitamin D supplementation without any adverse effects.  Plan: Adherence has been suboptimal.  She was given refills to take to a different pharmacy.   Orders: -     Cholecalciferol; Take one capsule twice per week  Dispense: 12 tablet; Refill: 0  Anxiety Assessment & Plan: Patient acknowledges having high levels of stress.  No SI or or HI.  We discussed the importance of establishing care with a behavioral health counselor.  She was provided with a  list of resources.  She will get assistance from her family to make an appointment.  Language is a barrier  Orders: -     Escitalopram Oxalate; Take 1 tablet (10 mg total) by mouth daily.  Dispense: 30 tablet; Refill: 0  Polyphagia -     Escitalopram Oxalate; Take 1 tablet (10 mg total) by mouth daily.  Dispense: 30 tablet; Refill: 0  Pre-diabetes Assessment &  Plan: Improved.  Her most recent A1c was 5.5.  She is on metformin twice a day and will continue medication.  She will also work on reducing simple and added sugars in her diet.   Orders: -     metFORMIN HCl; Take 1 tablet (1,000 mg total) by mouth 2 (two) times daily with a meal.  Dispense: 60 tablet; Refill: 0     PHYSICAL EXAM:  Blood pressure 129/85, pulse 71, temperature 98.2 F (36.8 C), height 5\' 8"  (1.727 m), weight 297 lb (134.7 kg), SpO2 97 %. Body mass index is 45.16 kg/m.  General: She is overweight, cooperative, alert, well developed, and in no acute distress. PSYCH: Has normal mood, affect and thought process.   HEENT: EOMI, sclerae are anicteric. Lungs: Normal breathing effort, no conversational dyspnea. Extremities: No edema.  Neurologic: No gross sensory or motor deficits. No tremors or fasciculations noted.    DIAGNOSTIC DATA REVIEWED:  BMET    Component Value Date/Time   NA 139 03/28/2022 0829   K 4.6 03/28/2022 0829   CL 103 03/28/2022 0829   CO2 23 03/28/2022 0829   GLUCOSE 90 03/28/2022 0829   GLUCOSE 115 (H) 04/05/2020 1632   BUN 13 03/28/2022 0829   CREATININE 0.61 03/28/2022 0829   CALCIUM 9.2 03/28/2022 0829   GFRNONAA >60 04/05/2020 1632   GFRAA 137 10/11/2019 1040   Lab Results  Component Value Date   HGBA1C 5.5 11/20/2021   HGBA1C 5.7 (H) 11/12/2018   Lab Results  Component Value Date   INSULIN 20.5 07/17/2021   INSULIN 34.1 (H) 10/11/2019   Lab Results  Component Value Date   TSH 2.540 10/11/2019   CBC    Component Value Date/Time   WBC 8.8 03/28/2022 0829   WBC 10.9 (H) 04/05/2020 1632   RBC 4.93 03/28/2022 0829   RBC 4.61 04/05/2020 1632   HGB 13.5 03/28/2022 0829   HCT 41.2 03/28/2022 0829   PLT 319 03/28/2022 0829   MCV 84 03/28/2022 0829   MCH 27.4 03/28/2022 0829   MCH 28.2 04/05/2020 1632   MCHC 32.8 03/28/2022 0829   MCHC 33.2 04/05/2020 1632   RDW 13.4 03/28/2022 0829   Iron Studies No results found for:  "IRON", "TIBC", "FERRITIN", "IRONPCTSAT" Lipid Panel     Component Value Date/Time   CHOL 172 10/11/2019 1040   TRIG 155 (H) 10/11/2019 1040   HDL 36 (L) 10/11/2019 1040   CHOLHDL 4.0 11/12/2018 1508   LDLCALC 108 (H) 10/11/2019 1040   Hepatic Function Panel     Component Value Date/Time   PROT 7.1 03/28/2022 0829   ALBUMIN 4.4 03/28/2022 0829   AST 16 03/28/2022 0829   ALT 19 03/28/2022 0829   ALKPHOS 73 03/28/2022 0829   BILITOT <0.2 03/28/2022 0829      Component Value Date/Time   TSH 2.540 10/11/2019 1040   Nutritional Lab Results  Component Value Date   VD25OH 25.2 (L) 11/20/2021   VD25OH 28.4 (L) 07/17/2021   VD25OH 25.2 (L) 06/07/2021     Return in about 3 weeks (  around 05/20/2022) for For Weight Mangement with Dr. Gerarda Fraction.Marland Kitchen She was informed of the importance of frequent follow up visits to maximize her success with intensive lifestyle modifications for her multiple health conditions.   ATTESTASTION STATEMENTS:  Reviewed by clinician on day of visit: allergies, medications, problem list, medical history, surgical history, family history, social history, and previous encounter notes.     Thomes Dinning, MD

## 2022-05-11 ENCOUNTER — Emergency Department (HOSPITAL_COMMUNITY)
Admission: EM | Admit: 2022-05-11 | Discharge: 2022-05-11 | Disposition: A | Discharge status: Home / Self Care | Payer: Medicaid Other | Attending: Emergency Medicine | Admitting: Emergency Medicine

## 2022-05-11 DIAGNOSIS — K0889 Other specified disorders of teeth and supporting structures: Secondary | ICD-10-CM

## 2022-05-11 MED ORDER — HYDROCODONE-ACETAMINOPHEN 5-325 MG PO TABS
1.0000 | ORAL_TABLET | Freq: Once | ORAL | Status: AC
  Administered 2022-05-11: 1 via ORAL
  Filled 2022-05-11: qty 1

## 2022-05-11 MED ORDER — HYDROCODONE-ACETAMINOPHEN 5-325 MG PO TABS: 2.0000 | ORAL_TABLET | Freq: Four times a day (QID) | ORAL | 0 refills | Status: DC | PRN

## 2022-05-11 MED ORDER — ONDANSETRON 4 MG PO TBDP
8.0000 mg | ORAL_TABLET | Freq: Once | ORAL | Status: AC
  Administered 2022-05-11: 8 mg via ORAL
  Filled 2022-05-11: qty 2

## 2022-05-11 NOTE — ED Provider Notes (Signed)
[facility] Provider Note   CSN: [id number] Arrival date & time: 05/11/22  1128     History  Chief Complaint  Patient presents with   Facial Swelling   Dental Pain    Cindy Dixon is a 32 y.o. female.  32 year old female presents today for evaluation of dental pain.  This has been ongoing for the past days.  She states she saw her dentist 2 to 3 days ago and had her extraction scheduled for Thursday.  Denies any 2 to fever, difficulty swallowing, drainage, or facial swelling however states the pain is significant.  The history is provided by the patient. No language interpreter was used.       Home Medications Prior to Admission medications   Medication Sig Start Date End Date Taking? Authorizing Provider  Cholecalciferol 1.25 MG (50000 UT) TABS Take one capsule twice per week 04/29/22   Thomes Dinning, MD  escitalopram (LEXAPRO) 10 MG tablet Take 1 tablet (10 mg total) by mouth daily. 04/29/22   Thomes Dinning, MD  metFORMIN (GLUCOPHAGE) 1000 MG tablet Take 1 tablet (1,000 mg total) by mouth 2 (two) times daily with a meal. 04/29/22   Thomes Dinning, MD  ondansetron (ZOFRAN) 4 MG tablet Take 1 tablet (4 mg total) by mouth every 8 (eight) hours as needed for nausea or vomiting. 11/20/21   Dan[place], Valetta Fuller D, NP  pantoprazole (PROTONIX) 40 MG tablet Take 1 tablet (40 mg total) by mouth daily. 03/27/22 06/25/22  Thomes Dinning, MD  SUMAtriptan (IMITREX) 50 MG tablet Take 1 tablet (50 mg total) by mouth as needed for migraine. May repeat in 2 hours if headache persists or recurs. Do not exceed 2 tablets in 24 hours. 11/20/21   Esaw Grandchild, NP      Allergies    Patient has no known allergies.    Review of Systems   Review of Systems  Constitutional:  Negative for fever.  HENT:  Positive for dental problem. Negative for drooling, sore throat, trouble swallowing and voice change.   All other systems reviewed and are  negative.   Physical Exam Updated Vital Signs BP (!) 144/101 (BP Location: Right Arm)   Pulse 70   Temp 98 F (36.7 C)   Resp 20   SpO2 97%  Physical Exam Vitals and nursing note reviewed.  Constitutional:      General: She is not in acute distress.    Appearance: Normal appearance. She is not ill-appearing.  HENT:     Head: Normocephalic and atraumatic.     Nose: Nose normal.     Mouth/Throat:     Mouth: Mucous membranes are moist.     Pharynx: No oropharyngeal exudate or posterior oropharyngeal erythema.     Comments: Without evidence of retropharyngeal abscess, Ludwig's angina, peritonsillar abscess, or dental abscess Eyes:     Conjunctiva/sclera: Conjunctivae normal.  Pulmonary:     Effort: Pulmonary effort is normal. No respiratory distress.  Musculoskeletal:        General: No deformity.  Skin:    Findings: No rash.  Neurological:     Mental Status: She is alert.     ED Results / Procedures / Treatments   Labs (all labs ordered are listed, but only abnormal results are displayed) Labs Reviewed - No data to display  EKG None  Radiology No results found.  Procedures Procedures    Medications Ordered in ED Medications  HYDROcodone-acetaminophen (NORCO/VICODIN) 5-325 MG per tablet 1  tablet (has no administration in time range)  ondansetron (ZOFRAN-ODT) disintegrating tablet 8 mg (has no administration in time range)    ED Course/ Medical Decision Making/ A&P                             Medical Decision Making Risk Prescription drug management.   32 year old female presents today for evaluation of dental pain.  Worse over the past 2 to 3 days.  No concerning features on exam.  Particularly no evidence of RPA, PTA, dental abscess, or Ludwig's angina.  Dose of pain medication given in the emergency department.  Discharged with short course of pain medication.  Discussed use of Tylenol and ibuprofen and reserving the pain medication for severe  breakthrough pain.  Patient is in agreement.  She will follow-up with her dentist.  Patient is appropriate for discharge.  No signs of infection.  Has close follow-up with dentist.   Final Clinical Impression(s) / ED Diagnoses Final diagnoses:  Pain, dental    Rx / DC Orders ED Discharge Orders          Ordered    HYDROcodone-acetaminophen (NORCO/VICODIN) 5-325 MG tablet  Every 6 hours PRN        05/11/22 [place], [place], PA-C 05/11/22 1316    Lennice Sites, DO 05/11/22 1530

## 2022-05-11 NOTE — ED Triage Notes (Addendum)
Patient complains of upper right dental pain that started yesterday. Patient has appointment for extraction on Thursday and is here for pain control.

## 2022-05-11 NOTE — ED Notes (Signed)
DC instructions reviewed with pt via interpreter.. PT verbalized understanding via interpreter.  Pt DC.

## 2022-05-11 NOTE — Discharge Instructions (Addendum)
Your exam today is overall reassuring.  I have sent pain medication into the pharmacy for you.  For any concerning symptoms please return to the emergency department.  Otherwise please call and follow-up with your dentist.  Take Tylenol 1000 mg every 8 hours.  In addition to this take ibuprofen 600 mg every 6-8 hours.  Reserve the pain medication that I have sent into the pharmacy for severe or breakthrough pain.  Su examen de hoy es tranquilizador en general.  He enviado analgsicos a la farmacia para usted.  Para cualquier sntoma preocupante, regrese al departamento de emergencias.  De lo contrario, llame y haga un seguimiento con su dentista.  Tome Tylenol 1000 mg cada 8 horas.  Adems de esto, tome ibuprofeno 600 mg cada 6-8 horas.  Reserve [employer] he enviado a Engineer, building services para el dolor intenso o irruptivo.

## 2022-05-21 ENCOUNTER — Ambulatory Visit (INDEPENDENT_AMBULATORY_CARE_PROVIDER_SITE_OTHER): Payer: Medicaid Other | Admitting: Internal Medicine

## 2022-05-21 ENCOUNTER — Encounter (INDEPENDENT_AMBULATORY_CARE_PROVIDER_SITE_OTHER): Payer: Self-pay | Admitting: Internal Medicine

## 2022-05-21 VITALS — BP 132/92 | HR 71 | Temp 98.4°F | Ht 68.0 in | Wt 286.0 lb

## 2022-05-21 DIAGNOSIS — R7303 Prediabetes: Secondary | ICD-10-CM

## 2022-05-21 DIAGNOSIS — E669 Obesity, unspecified: Secondary | ICD-10-CM

## 2022-05-21 DIAGNOSIS — Z6841 Body Mass Index (BMI) 40.0 and over, adult: Secondary | ICD-10-CM | POA: Diagnosis not present

## 2022-05-21 DIAGNOSIS — R030 Elevated blood-pressure reading, without diagnosis of hypertension: Secondary | ICD-10-CM | POA: Insufficient documentation

## 2022-05-21 DIAGNOSIS — F419 Anxiety disorder, unspecified: Secondary | ICD-10-CM

## 2022-05-21 DIAGNOSIS — R632 Polyphagia: Secondary | ICD-10-CM | POA: Insufficient documentation

## 2022-05-21 NOTE — Progress Notes (Signed)
Office: [phone]  /  Fax: [phone]  WEIGHT SUMMARY AND BIOMETRICS  Vitals Temp: 98.4 F (36.9 C) BP: (!) 132/92 Pulse Rate: 71 SpO2: 96 %   Anthropometric Measurements Height: 5\' 8"  (1.727 m) Weight: 286 lb (129.7 kg) BMI (Calculated): 43.5 Weight at Last Visit: 297 ln Weight Lost Since Last Visit: 11 lb Starting Weight: 321lb Total Weight Loss (lbs): 35 lb (15.9 kg)   Body Composition  Body Fat %: 49.1 % Fat Mass (lbs): 140.8 lbs Muscle Mass (lbs): 138.6 lbs Total Body Water (lbs): 98.4 lbs Visceral Fat Rating : 14    HPI  Chief Complaint: OBESITY  Cindy Dixon is here to discuss her progress with her obesity treatment plan. She is on the the Category 3 Plan and states she is following her eating plan approximately 40 % of the time. She states she is exercising 20 minutes 5 times per week.  Interval History:  Since last office visit she has lost 11 lbs. Had not been eating due to dental problems. Has stopped medications as a results. Family problems have improved.  She reports suboptimal adherence due to poor implementation .  She sometimes eats cream of wheat for breakfast, skips lunch and then for dinner when eating soup.  She denies unhealthy snacking.  She takes her children to buffets on the weekends.  Her anxiety levels and family problems have stabilized. Denies problems with appetite and hunger signals.  Denies problems with satiety and satiation.  Denies problems with eating patterns and portion control.  Denies abnormal cravings. Denies feeling deprived or restricted.   Barriers identified:  Was having dental pain .   Pharmacotherapy for weight loss: She is currently taking Metformin (off label use for incretin effect and / or insulin resistance and / or diabetes prevention) .    ASSESSMENT AND PLAN  TREATMENT PLAN FOR OBESITY:  Recommended Dietary Goals  Anali is currently in the action stage of change. As such, her goal is to  continue weight management plan. She has agreed to: continue current plan  Behavioral Intervention  We discussed the following Behavioral Modification Strategies today: increasing lean protein intake, decreasing simple carbohydrates , increasing vegetables, increasing lower glycemic fruits, avoiding skipping meals, increasing water intake, work on meal planning and preparation, decreasing eating out or consumption of processed foods, and making healthy choices when eating convenient foods, emotional eating strategies and understanding the difference between hunger signals and cravings, work on managing stress, creating time for self-care and relaxation measures, and planning for success.  Additional resources provided today: None  Recommended Physical Activity Goals  Jaeonna has been advised to work up to 150 minutes of moderate intensity aerobic activity a week and strengthening exercises 2-3 times per week for cardiovascular health, weight loss maintenance and preservation of muscle mass.   She has agreed to :  Continue current level of physical activity   Pharmacotherapy We discussed various medication options to help Sonika with her weight loss efforts and we both agreed to :  Continue metformin and SSRI therapy  ASSOCIATED CONDITIONS ADDRESSED TODAY  Anxiety Assessment & Plan: Improved on SSRI.  Continue Lexapro.   Polyphagia Assessment & Plan: Improved.  Continue metformin and SSRI.   Pre-diabetes Assessment & Plan: Improved.  Most recent A1c was 5.5.  Continue metformin for diabetes prevention.  No side effects reported   Obesity, current BMI 44.1  Elevated blood pressure reading without diagnosis of hypertension Assessment & Plan: Blood pressure elevated today she is currently taking Motrin  for dental pain.  She has a blood pressure machine at home and was advised on monitoring blood pressure.  We will recheck at the next office visit      PHYSICAL  EXAM:  Blood pressure (!) 132/92, pulse 71, temperature 98.4 F (36.9 C), height 5\' 8"  (1.727 m), weight 286 lb (129.7 kg), SpO2 96 %. Body mass index is 43.49 kg/m.  General: She is overweight, cooperative, alert, well developed, and in no acute distress. PSYCH: Has normal mood, affect and thought process.   HEENT: EOMI, sclerae are anicteric. Lungs: Normal breathing effort, no conversational dyspnea. Extremities: No edema.  Neurologic: No gross sensory or motor deficits. No tremors or fasciculations noted.    DIAGNOSTIC DATA REVIEWED:  BMET    Component Value Date/Time   NA 139 03/28/2022 0829   K 4.6 03/28/2022 0829   CL 103 03/28/2022 0829   CO2 23 03/28/2022 0829   GLUCOSE 90 03/28/2022 0829   GLUCOSE 115 (H) 04/05/2020 1632   BUN 13 03/28/2022 0829   CREATININE 0.61 03/28/2022 0829   CALCIUM 9.2 03/28/2022 0829   GFRNONAA >60 04/05/2020 1632   GFRAA 137 10/11/2019 1040   Lab Results  Component Value Date   HGBA1C 5.5 11/20/2021   HGBA1C 5.7 (H) 11/12/2018   Lab Results  Component Value Date   INSULIN 20.5 07/17/2021   INSULIN 34.1 (H) 10/11/2019   Lab Results  Component Value Date   TSH 2.540 10/11/2019   CBC    Component Value Date/Time   WBC 8.8 03/28/2022 0829   WBC 10.9 (H) 04/05/2020 1632   RBC 4.93 03/28/2022 0829   RBC 4.61 04/05/2020 1632   HGB 13.5 03/28/2022 0829   HCT 41.2 03/28/2022 0829   PLT 319 03/28/2022 0829   MCV 84 03/28/2022 0829   MCH 27.4 03/28/2022 0829   MCH 28.2 04/05/2020 1632   MCHC 32.8 03/28/2022 0829   MCHC 33.2 04/05/2020 1632   RDW 13.4 03/28/2022 0829   Iron Studies No results found for: "IRON", "TIBC", "FERRITIN", "IRONPCTSAT" Lipid Panel     Component Value Date/Time   CHOL 172 10/11/2019 1040   TRIG 155 (H) 10/11/2019 1040   HDL 36 (L) 10/11/2019 1040   CHOLHDL 4.0 11/12/2018 1508   LDLCALC 108 (H) 10/11/2019 1040   Hepatic Function Panel     Component Value Date/Time   PROT 7.1 03/28/2022 0829    ALBUMIN 4.4 03/28/2022 0829   AST 16 03/28/2022 0829   ALT 19 03/28/2022 0829   ALKPHOS 73 03/28/2022 0829   BILITOT <0.2 03/28/2022 0829      Component Value Date/Time   TSH 2.540 10/11/2019 1040   Nutritional Lab Results  Component Value Date   VD25OH 25.2 (L) 11/20/2021   VD25OH 28.4 (L) 07/17/2021   VD25OH 25.2 (L) 06/07/2021     Return in about 4 weeks (around 06/18/2022) for For Weight Mangement with Dr. Rikki Spearing.Marland Kitchen She was informed of the importance of frequent follow up visits to maximize her success with intensive lifestyle modifications for her multiple health conditions.   ATTESTASTION STATEMENTS:  Reviewed by clinician on day of visit: allergies, medications, problem list, medical history, surgical history, family history, social history, and previous encounter notes.     Worthy Rancher, MD

## 2022-05-21 NOTE — Assessment & Plan Note (Signed)
Improved on SSRI.  Continue Lexapro.

## 2022-05-21 NOTE — Assessment & Plan Note (Signed)
Improved.  Continue metformin and SSRI.

## 2022-05-21 NOTE — Assessment & Plan Note (Signed)
Blood pressure elevated today she is currently taking Motrin for dental pain.  She has a blood pressure machine at home and was advised on monitoring blood pressure.  We will recheck at the next office visit

## 2022-05-21 NOTE — Assessment & Plan Note (Signed)
Improved.  Most recent A1c was 5.5.  Continue metformin for diabetes prevention.  No side effects reported

## 2022-05-22 ENCOUNTER — Emergency Department (HOSPITAL_COMMUNITY)
Admission: EM | Admit: 2022-05-22 | Discharge: 2022-05-22 | Disposition: A | Discharge status: Home / Self Care | Payer: Medicaid Other | Attending: Emergency Medicine | Admitting: Emergency Medicine

## 2022-05-22 ENCOUNTER — Other Ambulatory Visit: Payer: Self-pay

## 2022-05-22 DIAGNOSIS — I10 Essential (primary) hypertension: Secondary | ICD-10-CM | POA: Insufficient documentation

## 2022-05-22 DIAGNOSIS — R519 Headache, unspecified: Secondary | ICD-10-CM | POA: Diagnosis present

## 2022-05-22 MED ORDER — ACETAMINOPHEN 500 MG PO TABS
1000.0000 mg | ORAL_TABLET | Freq: Once | ORAL | Status: AC
  Administered 2022-05-22: 1000 mg via ORAL
  Filled 2022-05-22: qty 2

## 2022-05-22 MED ORDER — IBUPROFEN 400 MG PO TABS
600.0000 mg | ORAL_TABLET | Freq: Once | ORAL | Status: AC
  Administered 2022-05-22: 600 mg via ORAL
  Filled 2022-05-22: qty 1

## 2022-05-22 NOTE — ED Triage Notes (Signed)
Pt show her nutritionist yesterday and her BP was high there and she checked it at home and it was also high, pt states she is having a headache

## 2022-05-22 NOTE — Discharge Instructions (Addendum)
You were evaluated today due to a headache and high blood pressure.  I recommend taking Tylenol or ibuprofen as needed for your headache.  Please follow the instructions on the label.  Your blood pressure was mildly elevated at the emergency department today.  Please follow-up with your primary care provider for further evaluation and management.  If you develop any life-threatening symptoms please return to the emergency department.

## 2022-05-22 NOTE — ED Provider Notes (Signed)
[place] EMERGENCY DEPARTMENT AT [facility] Provider Note   CSN: [id number] Arrival date & time: 05/22/22  2947     History  Chief Complaint  Patient presents with   Hypertension    Cindy Dixon is a 32 y.o. female.  Patient presents to the emergency department complaining of mild, left sided headache and concerns over hypertension.  The headache was gradual in onset, no visual changes, not the worst headache of her life, no known trauma.  She states that she was seen yesterday by her nutritionist who mentioned that she had high blood pressure and recommended that she be seen by primary care.  She was concerned today because her blood pressure was high.  Blood pressure upon arrival was 139/93.Marland Kitchen  Past medical history significant for ulcer disease, gastritis, GERD, class III severe obesity, elevated blood pressure, anxiety  HPI     Home Medications Prior to Admission medications   Medication Sig Start Date End Date Taking? Authorizing Provider  Cholecalciferol 1.25 MG (50000 UT) TABS Take one capsule twice per week 04/29/22   Worthy Rancher, MD  escitalopram (LEXAPRO) 10 MG tablet Take 1 tablet (10 mg total) by mouth daily. 04/29/22   Worthy Rancher, MD  HYDROcodone-acetaminophen (NORCO/VICODIN) 5-325 MG tablet Take 2 tablets by mouth every 6 (six) hours as needed for severe pain. 05/11/22   [name] Kansas, PA-C  metFORMIN (GLUCOPHAGE) 1000 MG tablet Take 1 tablet (1,000 mg total) by mouth 2 (two) times daily with a meal. 04/29/22   Worthy Rancher, MD  ondansetron (ZOFRAN) 4 MG tablet Take 1 tablet (4 mg total) by mouth every 8 (eight) hours as needed for nausea or vomiting. 11/20/21   Danford, Orpha Bur D, NP  pantoprazole (PROTONIX) 40 MG tablet Take 1 tablet (40 mg total) by mouth daily. 03/27/22 06/25/22  Worthy Rancher, MD  SUMAtriptan (IMITREX) 50 MG tablet Take 1 tablet (50 mg total) by mouth as needed for migraine. May repeat in 2 hours if headache persists or  recurs. Do not exceed 2 tablets in 24 hours. 11/20/21   Julaine Fusi, NP      Allergies    Patient has no known allergies.    Review of Systems   Review of Systems  Physical Exam Updated Vital Signs BP (!) 125/90   Pulse 75   Temp 97.8 F (36.6 C) (Oral)   Resp 18   SpO2 97%  Physical Exam Vitals and nursing note reviewed.  Constitutional:      General: She is not in acute distress.    Appearance: She is well-developed. She is obese.  HENT:     Head: Normocephalic and atraumatic.  Eyes:     Conjunctiva/sclera: Conjunctivae normal.  Cardiovascular:     Rate and Rhythm: Normal rate and regular rhythm.     Heart sounds: No murmur heard. Pulmonary:     Effort: Pulmonary effort is normal. No respiratory distress.     Breath sounds: Normal breath sounds.  Abdominal:     Palpations: Abdomen is soft.     Tenderness: There is no abdominal tenderness.  Musculoskeletal:        General: No swelling.     Cervical back: Neck supple.  Skin:    General: Skin is warm and dry.     Capillary Refill: Capillary refill takes less than 2 seconds.  Neurological:     Mental Status: She is alert.  Psychiatric:        Mood and Affect: Mood normal.  ED Results / Procedures / Treatments   Labs (all labs ordered are listed, but only abnormal results are displayed) Labs Reviewed - No data to display  EKG None  Radiology No results found.  Procedures Procedures    Medications Ordered in ED Medications  ibuprofen (ADVIL) tablet 600 mg (600 mg Oral Given 05/22/22 0836)  acetaminophen (TYLENOL) tablet 1,000 mg (1,000 mg Oral Given 05/22/22 7829)    ED Course/ Medical Decision Making/ A&P                             Medical Decision Making Risk OTC drugs.   Patient presents with chief complaint of left-sided headache.  Differential diagnosis includes was not limited to tension headache, migraine, other headache disorder, intracranial abnormalities  Patient has  comorbidities including obesity, high blood pressure, and high blood pressure.  I did review notes from the patient's nutritionist visit from yesterday  I see no indication at this time for laboratory workup.  The patient has no red flag symptoms such as sudden onset or worst headache of life that would necessitate intracranial imaging at this time.  Patient was administered Tylenol and ibuprofen for headache.  Upon reassessment the patient was feeling much better.  Patient's blood pressure after medication administration was noted to be 125/90.  Patient's headache is overall mild in nature.  I see no indication for further emergent workup of the headache at this time.  Plan to have patient resume over-the-counter medications as needed.  She states she took no over-the-counter medication prior to coming to the emergency department.  I discussed the patient's blood pressure and explained that this was not a level that required emergent intervention.  I explained that she needed to see her primary care provider for further evaluation of her hypertension at this time.  Patient voices understanding.  Discharge home.        Final Clinical Impression(s) / ED Diagnoses Final diagnoses:  Acute nonintractable headache, unspecified headache type  Hypertension, unspecified type    Rx / DC Orders ED Discharge Orders     None         Pamala Duffel 05/22/22 5621    Alvira Monday, MD 05/23/22 1235

## 2022-06-12 ENCOUNTER — Encounter: Payer: Self-pay | Admitting: Nurse Practitioner

## 2022-06-12 ENCOUNTER — Ambulatory Visit: Payer: Medicaid Other | Attending: Family Medicine | Admitting: Nurse Practitioner

## 2022-06-12 VITALS — BP 126/81 | HR 72 | Ht 68.0 in | Wt 286.2 lb

## 2022-06-12 DIAGNOSIS — G43809 Other migraine, not intractable, without status migrainosus: Secondary | ICD-10-CM | POA: Diagnosis not present

## 2022-06-12 DIAGNOSIS — F419 Anxiety disorder, unspecified: Secondary | ICD-10-CM | POA: Diagnosis not present

## 2022-06-12 DIAGNOSIS — Z01419 Encounter for gynecological examination (general) (routine) without abnormal findings: Secondary | ICD-10-CM

## 2022-06-12 DIAGNOSIS — Z23 Encounter for immunization: Secondary | ICD-10-CM

## 2022-06-12 DIAGNOSIS — Z7689 Persons encountering health services in other specified circumstances: Secondary | ICD-10-CM | POA: Diagnosis not present

## 2022-06-12 DIAGNOSIS — M25511 Pain in right shoulder: Secondary | ICD-10-CM

## 2022-06-12 DIAGNOSIS — R7303 Prediabetes: Secondary | ICD-10-CM

## 2022-06-12 DIAGNOSIS — Z1159 Encounter for screening for other viral diseases: Secondary | ICD-10-CM

## 2022-06-12 DIAGNOSIS — K219 Gastro-esophageal reflux disease without esophagitis: Secondary | ICD-10-CM

## 2022-06-12 DIAGNOSIS — G8929 Other chronic pain: Secondary | ICD-10-CM

## 2022-06-12 MED ORDER — METFORMIN HCL 1000 MG PO TABS
1000.0000 mg | ORAL_TABLET | Freq: Two times a day (BID) | ORAL | 1 refills | Status: DC
Start: 2022-06-12 — End: 2022-08-12

## 2022-06-12 MED ORDER — ESCITALOPRAM OXALATE 10 MG PO TABS
10.0000 mg | ORAL_TABLET | Freq: Every day | ORAL | 1 refills | Status: DC
Start: 2022-06-12 — End: 2022-06-17

## 2022-06-12 MED ORDER — DICLOFENAC SODIUM 1 % EX GEL
2.0000 g | Freq: Four times a day (QID) | CUTANEOUS | 0 refills | Status: AC
Start: 2022-06-12 — End: 2022-07-12

## 2022-06-12 MED ORDER — PANTOPRAZOLE SODIUM 40 MG PO TBEC
40.0000 mg | DELAYED_RELEASE_TABLET | Freq: Every day | ORAL | 1 refills | Status: AC
Start: 2022-06-12 — End: ?

## 2022-06-12 MED ORDER — METFORMIN HCL 1000 MG PO TABS
1000.0000 mg | ORAL_TABLET | Freq: Two times a day (BID) | ORAL | 1 refills | Status: DC
Start: 2022-06-12 — End: 2022-06-12

## 2022-06-12 MED ORDER — ONDANSETRON HCL 4 MG PO TABS
4.0000 mg | ORAL_TABLET | Freq: Three times a day (TID) | ORAL | 1 refills | Status: AC | PRN
Start: 2022-06-12 — End: ?

## 2022-06-12 MED ORDER — SUMATRIPTAN SUCCINATE 50 MG PO TABS
50.0000 mg | ORAL_TABLET | ORAL | 1 refills | Status: AC | PRN
Start: 2022-06-12 — End: ?

## 2022-06-12 MED ORDER — ESCITALOPRAM OXALATE 10 MG PO TABS
10.0000 mg | ORAL_TABLET | Freq: Every day | ORAL | 1 refills | Status: DC
Start: 2022-06-12 — End: 2022-06-12

## 2022-06-12 NOTE — Progress Notes (Signed)
Assessment & Plan:  [name] was seen today for establish care.  Diagnoses and all orders for this visit:  Encounter to establish care  Pre-diabetes -     Hemoglobin A1c -     metFORMIN (GLUCOPHAGE) 1000 MG tablet; Take 1 tablet (1,000 mg total) by mouth 2 (two) times daily with a meal.  Anxiety -     escitalopram (LEXAPRO) 10 MG tablet; Take 1 tablet (10 mg total) by mouth daily.  Other migraine without status migrainosus, not intractable Well controlled -     ondansetron (ZOFRAN) 4 MG tablet; Take 1 tablet (4 mg total) by mouth every 8 (eight) hours as needed for nausea or vomiting. -     SUMAtriptan (IMITREX) 50 MG tablet; Take 1 tablet (50 mg total) by mouth as needed for migraine. May repeat in 2 hours if headache persists or recurs. Do not exceed 2 tablets in 24 hours.  Gastroesophageal reflux disease, unspecified whether esophagitis present INSTRUCTIONS: Avoid GERD Triggers: acidic, spicy or fried foods, caffeine, coffee, sodas,  alcohol and chocolate.   -     pantoprazole (PROTONIX) 40 MG tablet; Take 1 tablet (40 mg total) by mouth daily.  Chronic right shoulder pain -     diclofenac Sodium (VOLTAREN) 1 % GEL; Apply 2 g topically 4 (four) times daily. -     DG Shoulder Right; Future  Need for hepatitis C screening test -     HCV Ab w Reflex to Quant PCR  Visit for gynecologic examination -     Ambulatory referral to Gynecology  Need for Tdap vaccination -     Tdap vaccine greater than or equal to 7yo IM    Patient has been counseled on age-appropriate routine health concerns for screening and prevention. These are reviewed and up-to-date. Referrals have been placed accordingly. Immunizations are up-to-date or declined.    Subjective:   Chief Complaint  Patient presents with   Establish Care   HPI Cindy Dixon 32 y.o. female presents to office today to establish care.  She has a past medical history of Anemia, Anxiety, Gastritis, GERD, morbid  obesity, Peptic ulcer disease, and Prediabetes.   There is an onsite interpreter present with her today.  Prediabetes Requesting weight loss injectable today. I instructed her that not only does her insurance not cover this but she also does not meet the criteria per insurance guidelines as she is not diabetic. She is also currently [facility] and taking metformin with no reported side effects Lab Results  Component Value Date   HGBA1C 5.5 11/20/2021     Right shoulder pain Onset several months ago. Unrelated to any injury or trauma. Pain is described as a cramp which radiates down the shoulder and into the forearm. Pain lasts a few minutes at a time and goes away on its own. 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 She is right hand dominant.     Review of Systems  Constitutional:  Negative for fever, malaise/fatigue and weight loss.  HENT: Negative.  Negative for nosebleeds.   Eyes: Negative.  Negative for blurred vision, double vision and photophobia.  Respiratory: Negative.  Negative for cough and shortness of breath.   Cardiovascular: Negative.  Negative for chest pain, palpitations and leg swelling.  Gastrointestinal: Negative.  Negative for heartburn, nausea and vomiting.  Musculoskeletal:  Positive for joint pain. Negative for myalgias.  Neurological: Negative.  Negative for dizziness, focal weakness,  seizures and headaches.  Psychiatric/Behavioral: Negative.  Negative for suicidal ideas.     Past Medical History:  Diagnosis Date   Anemia    Gastritis    GERD (gastroesophageal reflux disease)    Peptic ulcer disease    Prediabetes     Past Surgical History:  Procedure Laterality Date   55 HOUR PH STUDY N/A 05/17/2020   Procedure: 24 HOUR PH STUDY;  Surgeon: Lemar Lofty., MD;  Location: [facility];  Service: Gastroenterology;  Laterality: N/A;   CESAREAN SECTION     x2   CHOLECYSTECTOMY      ESOPHAGEAL MANOMETRY N/A 05/17/2020   Procedure: ESOPHAGEAL MANOMETRY (EM);  Surgeon: Lemar Lofty., MD;  Location: [facility];  Service: Gastroenterology;  Laterality: N/A;   TUBAL LIGATION      Family History  Problem Relation Age of Onset   Hypertension Mother    Hypertension Father    Arrhythmia Father    Heart disease Father    High Cholesterol Father    Diabetes Maternal Grandfather    Diabetes Paternal Grandfather    Colon cancer Neg Hx    Esophageal cancer Neg Hx    Rectal cancer Neg Hx    Stomach cancer Neg Hx     Social History Reviewed with no changes to be made today.   Outpatient Medications Prior to Visit  Medication Sig Dispense Refill   Cholecalciferol 1.25 MG (50000 UT) TABS Take one capsule twice per week 12 tablet 0   escitalopram (LEXAPRO) 10 MG tablet Take 1 tablet (10 mg total) by mouth daily. 30 tablet 0   HYDROcodone-acetaminophen (NORCO/VICODIN) 5-325 MG tablet Take 2 tablets by mouth every 6 (six) hours as needed for severe pain. 8 tablet 0   metFORMIN (GLUCOPHAGE) 1000 MG tablet Take 1 tablet (1,000 mg total) by mouth 2 (two) times daily with a meal. 60 tablet 0   ondansetron (ZOFRAN) 4 MG tablet Take 1 tablet (4 mg total) by mouth every 8 (eight) hours as needed for nausea or vomiting. 30 tablet 1   pantoprazole (PROTONIX) 40 MG tablet Take 1 tablet (40 mg total) by mouth daily. 90 tablet 0   SUMAtriptan (IMITREX) 50 MG tablet Take 1 tablet (50 mg total) by mouth as needed for migraine. May repeat in 2 hours if headache persists or recurs. Do not exceed 2 tablets in 24 hours. 30 tablet 0   No facility-administered medications prior to visit.    No Known Allergies     Objective:    BP 126/81 (BP Location: Left Arm, Patient Position: Sitting, Cuff Size: Large)   Pulse 72   Ht 5\' 8"  (1.727 m)   Wt 286 lb 3.2 oz (129.8 kg)   LMP 05/03/2022 (Exact Date)   SpO2 98%   BMI 43.52 kg/m  Wt Readings from Last 3 Encounters:  06/12/22 286  lb 3.2 oz (129.8 kg)  05/21/22 286 lb (129.7 kg)  04/29/22 297 lb (134.7 kg)    Physical Exam Vitals and nursing note reviewed.  Constitutional:      Appearance: She is well-developed.  HENT:     Head: Normocephalic and atraumatic.  Cardiovascular:     Rate and Rhythm: Normal rate and regular rhythm.     Heart sounds: Normal heart sounds. No murmur heard.    No friction rub. No gallop.  Pulmonary:     Effort: Pulmonary effort is normal. No tachypnea or respiratory distress.     Breath sounds: Normal breath sounds. No decreased  breath sounds, wheezing, rhonchi or rales.  Chest:     Chest wall: No tenderness.  Abdominal:     General: Bowel sounds are normal.     Palpations: Abdomen is soft.  Musculoskeletal:     Left shoulder: Decreased range of motion.     Cervical back: Normal range of motion.  Skin:    General: Skin is warm and dry.  Neurological:     Mental Status: She is alert and oriented to person, place, and time.     Coordination: Coordination normal.  Psychiatric:        Behavior: Behavior normal. Behavior is cooperative.        Thought Content: Thought content normal.        Judgment: Judgment normal.          Patient has been counseled extensively about nutrition and exercise as well as the importance of adherence with medications and regular follow-up. The patient was given clear instructions to go to ER or return to medical center if symptoms don't improve, worsen or new problems develop. The patient verbalized understanding.   Follow-up: Return in about 3 months (around 09/12/2022).   Claiborne Rigg, FNP-BC [facility] and [facility] [place], [place] [phone]   06/12/2022, 10:19 AM

## 2022-06-12 NOTE — Progress Notes (Signed)
Right shoulder pain

## 2022-06-13 LAB — HCV INTERPRETATION

## 2022-06-13 LAB — HEMOGLOBIN A1C
Est. average glucose Bld gHb Est-mCnc: 114 mg/dL
Hgb A1c MFr Bld: 5.6 % (ref 4.8–5.6)

## 2022-06-13 LAB — HCV AB W REFLEX TO QUANT PCR: HCV Ab: NONREACTIVE

## 2022-06-17 ENCOUNTER — Ambulatory Visit (INDEPENDENT_AMBULATORY_CARE_PROVIDER_SITE_OTHER): Payer: Medicaid Other | Admitting: Internal Medicine

## 2022-06-17 ENCOUNTER — Encounter (INDEPENDENT_AMBULATORY_CARE_PROVIDER_SITE_OTHER): Payer: Self-pay | Admitting: Internal Medicine

## 2022-06-17 VITALS — BP 112/74 | HR 74 | Temp 98.1°F | Ht 68.0 in | Wt 287.0 lb

## 2022-06-17 DIAGNOSIS — Z9851 Tubal ligation status: Secondary | ICD-10-CM | POA: Diagnosis not present

## 2022-06-17 DIAGNOSIS — E559 Vitamin D deficiency, unspecified: Secondary | ICD-10-CM | POA: Diagnosis not present

## 2022-06-17 DIAGNOSIS — E669 Obesity, unspecified: Secondary | ICD-10-CM | POA: Diagnosis not present

## 2022-06-17 DIAGNOSIS — R7303 Prediabetes: Secondary | ICD-10-CM | POA: Diagnosis not present

## 2022-06-17 DIAGNOSIS — Z6841 Body Mass Index (BMI) 40.0 and over, adult: Secondary | ICD-10-CM | POA: Diagnosis not present

## 2022-06-17 NOTE — Progress Notes (Signed)
Office: [phone]  /  Fax: [phone]  WEIGHT SUMMARY AND BIOMETRICS  Vitals Temp: 98.1 F (36.7 C) BP: 112/74 Pulse Rate: 74 SpO2: 97 %   Anthropometric Measurements Height: 5\' 8"  (1.727 m) Weight: 287 lb (130.2 kg) BMI (Calculated): 43.65 Weight at Last Visit: 286 lb Weight Gained Since Last Visit: 1 lb Starting Weight: 321 lb Total Weight Loss (lbs): 34 lb (15.4 kg)   Body Composition  Body Fat %: 49.5 % Fat Mass (lbs): 142 lbs Muscle Mass (lbs): 137.8 lbs Total Body Water (lbs): 99.6 lbs Visceral Fat Rating : 14    No data recorded Today's Visit #: 37  Starting Date: 10/11/19 Leading to baby's supervised so that she can afford anything but her she did find a compound pharmacy so she wanted to talk to educator for in her home is given that she has the kids and her little she does not eat so she does not take time to Qsymia she does not eat she overeats so she thinks she could do them she is just continue to Lose sounds for like stress continue to shift weight but she is to keep scheduled never had abdominal downtown equidistant history of  HPI  Chief Complaint: OBESITY  Cindy Dixon is here to discuss her progress with her obesity treatment plan. She is on the the Category 3 Plan and states she is following her eating plan approximately 50 % of the time. She states she is exercising 30 minutes 5 times per week.  Interval History:  Since last office visit she has gained 1 lb. 24 hr recall: Scrambled (2), 4 soda crackers, Lunch: skipped lack of appetite. Dinner at 5:00, potato salad, fried beef. Snacks: yogurt (activa) and string cheese. Eating out on weekends. Works in housekeeping 6 hours. She acknowledges not eating enough protein, vegetables or fruits. She reports fair adherence to reduced calorie nutritional plan. Denies problems with appetite and hunger signals.  Denies problems with satiety and satiation.  Denies problems with eating patterns and  portion control.  Denies abnormal cravings. Denies feeling deprived or restricted.     Barriers identified: having difficulty preparing healthy meals, having difficulty with meal prep and planning, and restricted palate with very little diversity in foods. .   Pharmacotherapy for weight loss: She is currently taking Metformin (off label use for incretin effect and / or insulin resistance and / or diabetes prevention) .    ASSESSMENT AND PLAN  TREATMENT PLAN FOR OBESITY:  Recommended Dietary Goals  Cindy Dixon is currently in the action stage of change. As such, her goal is to continue weight management plan. She has agreed to: continue current plan  Behavioral Intervention  We discussed the following Behavioral Modification Strategies today: increasing lean protein intake, increasing vegetables, increasing lower glycemic fruits, increasing fiber rich foods, avoiding skipping meals, increasing water intake, reading food labels , and decreasing eating out or consumption of processed foods, and making healthy choices when eating convenient foods.  Additional resources provided today: None  Recommended Physical Activity Goals  Cindy Dixon has been advised to work up to 150 minutes of moderate intensity aerobic activity a week and strengthening exercises 2-3 times per week for cardiovascular health, weight loss maintenance and preservation of muscle mass.   She has agreed to :  Think about ways to increase physical activity  Pharmacotherapy We discussed various medication options to help Cindy Dixon with her weight loss efforts and we both agreed to : continue current anti-obesity medication regimen  ASSOCIATED CONDITIONS ADDRESSED  TODAY  History of tubal ligation Assessment & Plan: Consider treatment with topiramate once we continue with tailoring nutritional plan.   Vitamin D deficiency  Pre-diabetes Assessment & Plan: Her most recent hemoglobin A1c is 5.6.  She is currently  on metformin twice a day without any adverse effects.  She will continue medication for diabetes prevention and incretin effect.  We will check B12 levels at 6 months.  Medication was refilled by PCP.   Obesity, current BMI 43.1    PHYSICAL EXAM:  Blood pressure 112/74, pulse 74, temperature 98.1 F (36.7 C), height 5\' 8"  (1.727 m), weight 287 lb (130.2 kg), last menstrual period 06/17/2022, SpO2 97 %. Body mass index is 43.64 kg/m.  General: She is overweight, cooperative, alert, well developed, and in no acute distress. PSYCH: Has normal mood, affect and thought process.   HEENT: EOMI, sclerae are anicteric. Lungs: Normal breathing effort, no conversational dyspnea. Extremities: No edema.  Neurologic: No gross sensory or motor deficits. No tremors or fasciculations noted.    DIAGNOSTIC DATA REVIEWED:  BMET    Component Value Date/Time   NA 139 03/28/2022 0829   K 4.6 03/28/2022 0829   CL 103 03/28/2022 0829   CO2 23 03/28/2022 0829   GLUCOSE 90 03/28/2022 0829   GLUCOSE 115 (H) 04/05/2020 1632   BUN 13 03/28/2022 0829   CREATININE 0.61 03/28/2022 0829   CALCIUM 9.2 03/28/2022 0829   GFRNONAA >60 04/05/2020 1632   GFRAA 137 10/11/2019 1040   Lab Results  Component Value Date   HGBA1C 5.6 06/12/2022   HGBA1C 5.7 (H) 11/12/2018   Lab Results  Component Value Date   INSULIN 20.5 07/17/2021   INSULIN 34.1 (H) 10/11/2019   Lab Results  Component Value Date   TSH 2.540 10/11/2019   CBC    Component Value Date/Time   WBC 8.8 03/28/2022 0829   WBC 10.9 (H) 04/05/2020 1632   RBC 4.93 03/28/2022 0829   RBC 4.61 04/05/2020 1632   HGB 13.5 03/28/2022 0829   HCT 41.2 03/28/2022 0829   PLT 319 03/28/2022 0829   MCV 84 03/28/2022 0829   MCH 27.4 03/28/2022 0829   MCH 28.2 04/05/2020 1632   MCHC 32.8 03/28/2022 0829   MCHC 33.2 04/05/2020 1632   RDW 13.4 03/28/2022 0829   Iron Studies No results found for: "IRON", "TIBC", "FERRITIN", "IRONPCTSAT" Lipid Panel      Component Value Date/Time   CHOL 172 10/11/2019 1040   TRIG 155 (H) 10/11/2019 1040   HDL 36 (L) 10/11/2019 1040   CHOLHDL 4.0 11/12/2018 1508   LDLCALC 108 (H) 10/11/2019 1040   Hepatic Function Panel     Component Value Date/Time   PROT 7.1 03/28/2022 0829   ALBUMIN 4.4 03/28/2022 0829   AST 16 03/28/2022 0829   ALT 19 03/28/2022 0829   ALKPHOS 73 03/28/2022 0829   BILITOT <0.2 03/28/2022 0829      Component Value Date/Time   TSH 2.540 10/11/2019 1040   Nutritional Lab Results  Component Value Date   VD25OH 25.2 (L) 11/20/2021   VD25OH 28.4 (L) 07/17/2021   VD25OH 25.2 (L) 06/07/2021     Return in about 3 weeks (around 07/08/2022) for For Weight Mangement with Dr. Rikki Spearing.Marland Kitchen She was informed of the importance of frequent follow up visits to maximize her success with intensive lifestyle modifications for her multiple health conditions.   ATTESTASTION STATEMENTS:  Reviewed by clinician on day of visit: allergies, medications, problem list, medical history, surgical history,  family history, social history, and previous encounter notes.     Worthy Rancher, MD

## 2022-06-17 NOTE — Assessment & Plan Note (Signed)
Consider treatment with topiramate once we continue with tailoring nutritional plan.

## 2022-06-17 NOTE — Assessment & Plan Note (Addendum)
Her most recent hemoglobin A1c is 5.6.  She is currently on metformin twice a day without any adverse effects.  She will continue medication for diabetes prevention and incretin effect.  We will check B12 levels at 6 months.  Medication was refilled by PCP.

## 2022-07-11 ENCOUNTER — Ambulatory Visit (INDEPENDENT_AMBULATORY_CARE_PROVIDER_SITE_OTHER): Payer: Medicaid Other | Admitting: Internal Medicine

## 2022-07-11 ENCOUNTER — Encounter (INDEPENDENT_AMBULATORY_CARE_PROVIDER_SITE_OTHER): Payer: Self-pay | Admitting: Internal Medicine

## 2022-07-11 VITALS — BP 115/80 | HR 77 | Temp 98.2°F | Ht 68.0 in | Wt 288.0 lb

## 2022-07-11 DIAGNOSIS — R7303 Prediabetes: Secondary | ICD-10-CM | POA: Diagnosis not present

## 2022-07-11 DIAGNOSIS — Z6841 Body Mass Index (BMI) 40.0 and over, adult: Secondary | ICD-10-CM

## 2022-07-11 DIAGNOSIS — R632 Polyphagia: Secondary | ICD-10-CM

## 2022-07-11 DIAGNOSIS — E669 Obesity, unspecified: Secondary | ICD-10-CM | POA: Diagnosis not present

## 2022-07-11 DIAGNOSIS — E559 Vitamin D deficiency, unspecified: Secondary | ICD-10-CM | POA: Diagnosis not present

## 2022-07-11 MED ORDER — TOPIRAMATE 25 MG PO TABS
25.0000 mg | ORAL_TABLET | Freq: Every evening | ORAL | 0 refills | Status: DC
Start: 2022-07-11 — End: 2022-08-12

## 2022-07-11 NOTE — Progress Notes (Signed)
Office: [phone]  /  Fax: [phone]  WEIGHT SUMMARY AND BIOMETRICS  Vitals Temp: 98.2 F (36.8 C) BP: 115/80 Pulse Rate: 77 SpO2: 98 %   Anthropometric Measurements Height: 5\' 8"  (1.727 m) Weight: 288 lb (130.6 kg) BMI (Calculated): 43.8 Weight at Last Visit: 287lb Weight Lost Since Last Visit: 0 Weight Gained Since Last Visit: 1lb Starting Weight: 321lb Total Weight Loss (lbs): 33 lb (15 kg)   Body Composition  Body Fat %: 45 % Fat Mass (lbs): 129.8 lbs Muscle Mass (lbs): 150.8 lbs Total Body Water (lbs): 104.2 lbs Visceral Fat Rating : 12    No data recorded Today's Visit #: 38  Starting Date: 10/11/19   HPI  Chief Complaint: OBESITY  Cindy Dixon is here to discuss her progress with her obesity treatment plan. She is on the the Category 3 Plan and states she is following her eating plan approximately 50 % of the time. She states she is exercising by walking 30 minutes 5 times per week.  Interval History:  Since last office visit she has maintained. She reports fair adherence to reduced calorie nutritional plan.  She has cut down on rice, breads and has increased fruits and vegetables.  She does not drink liquid calories.  She has noticed changes in close size even though scale does not reflect weight changes.  Her BIA does confirm an increase in muscle mass as well as decrease in fat mass. Denies problems with appetite and hunger signals.  Denies problems with satiety and satiation.  Denies problems with eating patterns and portion control.  Reports abnormal cravings. Denies feeling deprived or restricted.   Barriers identified: strong hunger signals and appetite, having difficulty focusing on healthy eating, and complex family dynamics.   Pharmacotherapy for weight loss: She is currently taking Metformin (off label use for incretin effect and / or insulin resistance and / or diabetes prevention) .    ASSESSMENT AND PLAN  TREATMENT PLAN FOR  OBESITY:  Recommended Dietary Goals  Cindy Dixon is currently in the action stage of change. As such, her goal is to continue weight management plan. She has agreed to: continue current plan  Behavioral Intervention  We discussed the following Behavioral Modification Strategies today: increasing lean protein intake, decreasing simple carbohydrates , increasing vegetables, increasing lower glycemic fruits, increasing fiber rich foods, increasing water intake, avoiding temptations and identifying enticing environmental cues, continue to practice mindfulness when eating, and planning for success.  Additional resources provided today: None  Recommended Physical Activity Goals  Cindy Dixon has been advised to work up to 150 minutes of moderate intensity aerobic activity a week and strengthening exercises 2-3 times per week for cardiovascular health, weight loss maintenance and preservation of muscle mass.   She has agreed to :  Think about ways to increase daily physical activity and overcoming barriers to exercise  Pharmacotherapy We discussed various medication options to help Cindy Dixon with her weight loss efforts and we both agreed to : start anti-obesity medication. In addition to reduced calorie nutrition plan (RCNP), behavioral strategies and physical activity, Cindy Dixon would benefit from pharmacotherapy to assist with hunger signals, satiety and cravings. This will reduce obesity-related health risks by inducing weight loss, and help reduce food consumption and adherence to [facility]) . It may also improve QOL by improving self-confidence and reduce the  setbacks associated with metabolic adaptations.  After discussion of treatment options, mechanisms of action, benefits, side effects, contraindications and shared decision making she is agreeable to starting topiramate 25 mg in  the evening.  She has a tubal ligation.  ASSOCIATED CONDITIONS ADDRESSED TODAY  Pre-diabetes Assessment &  Plan: Her most recent hemoglobin A1c is 5.6.  She is currently on metformin twice a day without any adverse effects.  She will continue medication for diabetes prevention and incretin effect.  We will check B12 levels at 6 months.  Medication was refilled by PCP.   Vitamin D deficiency -     Topiramate; Take 1 tablet (25 mg total) by mouth every evening.  Dispense: 30 tablet; Refill: 0  Obesity, current BMI 43.1  Polyphagia Assessment & Plan: Improving, she is no longer on SSRI.  Her anxiety and depressive symptoms have improved.  She will be started on topiramate 25 mg in the evening.  She has a tubal ligation.  She was counseled on medication side effects.  We will monitor for any symptoms of anxiety and depression.     PHYSICAL EXAM:  Blood pressure 115/80, pulse 77, temperature 98.2 F (36.8 C), height 5\' 8"  (1.727 m), weight 288 lb (130.6 kg), last menstrual period 06/17/2022, SpO2 98 %. Body mass index is 43.79 kg/m.  General: She is overweight, cooperative, alert, well developed, and in no acute distress. PSYCH: Has normal mood, affect and thought process.   HEENT: EOMI, sclerae are anicteric. Lungs: Normal breathing effort, no conversational dyspnea. Extremities: No edema.  Neurologic: No gross sensory or motor deficits. No tremors or fasciculations noted.    DIAGNOSTIC DATA REVIEWED:  BMET    Component Value Date/Time   NA 139 03/28/2022 0829   K 4.6 03/28/2022 0829   CL 103 03/28/2022 0829   CO2 23 03/28/2022 0829   GLUCOSE 90 03/28/2022 0829   GLUCOSE 115 (H) 04/05/2020 1632   BUN 13 03/28/2022 0829   CREATININE 0.61 03/28/2022 0829   CALCIUM 9.2 03/28/2022 0829   GFRNONAA >60 04/05/2020 1632   GFRAA 137 10/11/2019 1040   Lab Results  Component Value Date   HGBA1C 5.6 06/12/2022   HGBA1C 5.7 (H) 11/12/2018   Lab Results  Component Value Date   INSULIN 20.5 07/17/2021   INSULIN 34.1 (H) 10/11/2019   Lab Results  Component Value Date   TSH 2.540  10/11/2019   CBC    Component Value Date/Time   WBC 8.8 03/28/2022 0829   WBC 10.9 (H) 04/05/2020 1632   RBC 4.93 03/28/2022 0829   RBC 4.61 04/05/2020 1632   HGB 13.5 03/28/2022 0829   HCT 41.2 03/28/2022 0829   PLT 319 03/28/2022 0829   MCV 84 03/28/2022 0829   MCH 27.4 03/28/2022 0829   MCH 28.2 04/05/2020 1632   MCHC 32.8 03/28/2022 0829   MCHC 33.2 04/05/2020 1632   RDW 13.4 03/28/2022 0829   Iron Studies No results found for: "IRON", "TIBC", "FERRITIN", "IRONPCTSAT" Lipid Panel     Component Value Date/Time   CHOL 172 10/11/2019 1040   TRIG 155 (H) 10/11/2019 1040   HDL 36 (L) 10/11/2019 1040   CHOLHDL 4.0 11/12/2018 1508   LDLCALC 108 (H) 10/11/2019 1040   Hepatic Function Panel     Component Value Date/Time   PROT 7.1 03/28/2022 0829   ALBUMIN 4.4 03/28/2022 0829   AST 16 03/28/2022 0829   ALT 19 03/28/2022 0829   ALKPHOS 73 03/28/2022 0829   BILITOT <0.2 03/28/2022 0829      Component Value Date/Time   TSH 2.540 10/11/2019 1040   Nutritional Lab Results  Component Value Date   VD25OH 25.2 (L) 11/20/2021  VD25OH 28.4 (L) 07/17/2021   VD25OH 25.2 (L) 06/07/2021     Return in about 3 weeks (around 08/01/2022) for For Weight Mangement with Dr. Rikki Spearing.Marland Kitchen She was informed of the importance of frequent follow up visits to maximize her success with intensive lifestyle modifications for her multiple health conditions.   ATTESTASTION STATEMENTS:  Reviewed by clinician on day of visit: allergies, medications, problem list, medical history, surgical history, family history, social history, and previous encounter notes.     Worthy Rancher, MD

## 2022-07-11 NOTE — Assessment & Plan Note (Signed)
Improving, she is no longer on SSRI.  Her anxiety and depressive symptoms have improved.  She will be started on topiramate 25 mg in the evening.  She has a tubal ligation.  She was counseled on medication side effects.  We will monitor for any symptoms of anxiety and depression.

## 2022-07-11 NOTE — Assessment & Plan Note (Signed)
Her most recent hemoglobin A1c is 5.6.  She is currently on metformin twice a day without any adverse effects.  She will continue medication for diabetes prevention and incretin effect.  We will check B12 levels at 6 months.  Medication was refilled by PCP. 

## 2022-07-31 ENCOUNTER — Other Ambulatory Visit (INDEPENDENT_AMBULATORY_CARE_PROVIDER_SITE_OTHER): Payer: Self-pay | Admitting: Internal Medicine

## 2022-08-02 ENCOUNTER — Ambulatory Visit: Payer: Medicaid Other | Admitting: Nurse Practitioner

## 2022-08-12 ENCOUNTER — Ambulatory Visit (INDEPENDENT_AMBULATORY_CARE_PROVIDER_SITE_OTHER): Payer: Medicaid Other | Admitting: Internal Medicine

## 2022-08-12 ENCOUNTER — Encounter (INDEPENDENT_AMBULATORY_CARE_PROVIDER_SITE_OTHER): Payer: Self-pay | Admitting: Internal Medicine

## 2022-08-12 VITALS — BP 112/75 | HR 74 | Temp 98.2°F | Ht 68.0 in | Wt 293.0 lb

## 2022-08-12 DIAGNOSIS — E669 Obesity, unspecified: Secondary | ICD-10-CM | POA: Diagnosis not present

## 2022-08-12 DIAGNOSIS — Z5181 Encounter for therapeutic drug level monitoring: Secondary | ICD-10-CM

## 2022-08-12 DIAGNOSIS — Z6841 Body Mass Index (BMI) 40.0 and over, adult: Secondary | ICD-10-CM

## 2022-08-12 DIAGNOSIS — R632 Polyphagia: Secondary | ICD-10-CM | POA: Diagnosis not present

## 2022-08-12 DIAGNOSIS — R7303 Prediabetes: Secondary | ICD-10-CM

## 2022-08-12 DIAGNOSIS — E66813 Obesity, class 3: Secondary | ICD-10-CM

## 2022-08-12 DIAGNOSIS — E559 Vitamin D deficiency, unspecified: Secondary | ICD-10-CM

## 2022-08-12 MED ORDER — METFORMIN HCL 1000 MG PO TABS: 1000.0000 mg | ORAL_TABLET | Freq: Two times a day (BID) | ORAL | 1 refills | Status: DC

## 2022-08-12 MED ORDER — TOPIRAMATE 25 MG PO TABS
25.0000 mg | ORAL_TABLET | Freq: Every evening | ORAL | 0 refills | Status: DC
Start: 2022-08-12 — End: 2022-10-07

## 2022-08-12 NOTE — Assessment & Plan Note (Signed)
Because of long-term therapy with metformin we will check B12 levels today.

## 2022-08-12 NOTE — Assessment & Plan Note (Signed)
We are checking vitamin D levels today.  She is on high-dose vitamin D supplementation without any adverse effects.  If her levels are now at goal she will be transition to over-the-counter supplementation.

## 2022-08-12 NOTE — Assessment & Plan Note (Signed)
Neurohormonal and psychological.  Under significant stress due to family dynamics.  She is no longer on SSRI because she was experiencing side effects.  She did not fill topiramate which had been prescribed previous office visit.  Medication was sent again today.  Medication side effects were reviewed.  She has a tubal ligation.

## 2022-08-12 NOTE — Progress Notes (Signed)
Office: [phone]  /  Fax: [phone]  WEIGHT SUMMARY AND BIOMETRICS  Vitals Temp: 98.2 F (36.8 C) BP: 112/75 Pulse Rate: 74 SpO2: 98 %   Anthropometric Measurements Height: 5\' 8"  (1.727 m) Weight: 293 lb (132.9 kg) BMI (Calculated): 44.56 Weight at Last Visit: 288lb Weight Lost Since Last Visit: 0 Weight Gained Since Last Visit: 5lb Starting Weight: 321lb Total Weight Loss (lbs): 27 lb (12.2 kg)   Body Composition  Body Fat %: 47.1 % Fat Mass (lbs): 138.2 lbs Muscle Mass (lbs): 147.6 lbs Total Body Water (lbs): 102.8 lbs Visceral Fat Rating : 13    No data recorded Today's Visit #: 39  Starting Date: 10/11/19   HPI  Chief Complaint: OBESITY  Cindy Dixon is here to discuss her progress with her obesity treatment plan. She is on the the Category 3 Plan and states she is following her eating plan approximately 50 % of the time. She states she is exercising by walking 20 minutes 5 times per week.  Interval History:  Since last office visit she has gained 5 pounds.  There has been changes in intake, skipping lunch and increasing highly palatable foods.  Reports increased anxiety and stress due to family problems.  She continues to experience problems obtaining medications due to language barriers and low health literacy.  Did not receive topiramate prescribed last OV. Starting gym today with her family.  She reports variable adherence to reduced calorie nutritional plan   Orixegenic Control: Reports problems with appetite and hunger signals.  Denies problems with satiety and satiation.  Denies problems with eating patterns and portion control.  Reports abnormal cravings. Denies feeling deprived or restricted.   Barriers identified: strong hunger signals and appetite, having difficulty focusing on healthy eating, exposure to enticing environments and or relationships, and family problems .   Pharmacotherapy for weight loss: She is currently taking  Metformin (off label use for incretin effect and / or insulin resistance and / or diabetes prevention) with adequate clinical response  and without side effects..    ASSESSMENT AND PLAN  TREATMENT PLAN FOR OBESITY:  Recommended Dietary Goals  Lovia is currently in the action stage of change. As such, her goal is to continue weight management plan. She has agreed to: continue current plan  Behavioral Intervention  We discussed the following Behavioral Modification Strategies today: increasing lean protein intake, decreasing simple carbohydrates , increasing vegetables, increasing lower glycemic fruits, increasing fiber rich foods, avoiding skipping meals, increasing water intake, work on managing stress, creating time for self-care and relaxation measures, avoiding temptations and identifying enticing environmental cues, and planning for success.  Additional resources provided today: None  Recommended Physical Activity Goals  Amar has been advised to work up to 150 minutes of moderate intensity aerobic activity a week and strengthening exercises 2-3 times per week for cardiovascular health, weight loss maintenance and preservation of muscle mass.   She has agreed to :  Think about ways to increase daily physical activity and overcoming barriers to exercise and Increase physical activity in their day and reduce sedentary time (increase NEAT).  Pharmacotherapy We discussed various medication options to help Cindy Dixon with her weight loss efforts and we both agreed to :  Continue metformin at current dose.  We have also sent again prescription for topiramate 25 mg in the evening.  She has a tubal ligation.  ASSOCIATED CONDITIONS ADDRESSED TODAY  Therapeutic drug monitoring Assessment & Plan: Because of long-term therapy with metformin we will check B12 levels  today.  Orders: -     Vitamin B12  Pre-diabetes Assessment & Plan: Her most recent A1c is improved to 5.6.  She  is currently on metformin 1000 mg twice daily without any adverse effects.  She will continue current medication for pharmacoprophylaxis and diabetes prevention.  Orders: -     metFORMIN HCl; Take 1 tablet (1,000 mg total) by mouth 2 (two) times daily with a meal.  Dispense: 180 tablet; Refill: 1 -     Vitamin B12  Vitamin D deficiency Assessment & Plan: We are checking vitamin D levels today.  She is on high-dose vitamin D supplementation without any adverse effects.  If her levels are now at goal she will be transition to over-the-counter supplementation.  Orders: -     Topiramate; Take 1 tablet (25 mg total) by mouth every evening.  Dispense: 30 tablet; Refill: 0 -     VITAMIN D 25 Hydroxy (Vit-D Deficiency, Fractures)  Polyphagia Assessment & Plan: Neurohormonal and psychological.  Under significant stress due to family dynamics.  She is no longer on SSRI because she was experiencing side effects.  She did not fill topiramate which had been prescribed previous office visit.  Medication was sent again today.  Medication side effects were reviewed.  She has a tubal ligation.   Obesity, current BMI 43.1    PHYSICAL EXAM:  Blood pressure 112/75, pulse 74, temperature 98.2 F (36.8 C), height 5\' 8"  (1.727 m), weight 293 lb (132.9 kg), SpO2 98 %. Body mass index is 44.55 kg/m.  General: She is overweight, cooperative, alert, well developed, and in no acute distress. PSYCH: Has normal mood, affect and thought process.   HEENT: EOMI, sclerae are anicteric. Lungs: Normal breathing effort, no conversational dyspnea. Extremities: No edema.  Neurologic: No gross sensory or motor deficits. No tremors or fasciculations noted.    DIAGNOSTIC DATA REVIEWED:  BMET    Component Value Date/Time   NA 139 03/28/2022 0829   K 4.6 03/28/2022 0829   CL 103 03/28/2022 0829   CO2 23 03/28/2022 0829   GLUCOSE 90 03/28/2022 0829   GLUCOSE 115 (H) 04/05/2020 1632   BUN 13 03/28/2022 0829    CREATININE 0.61 03/28/2022 0829   CALCIUM 9.2 03/28/2022 0829   GFRNONAA >60 04/05/2020 1632   GFRAA 137 10/11/2019 1040   Lab Results  Component Value Date   HGBA1C 5.6 06/12/2022   HGBA1C 5.7 (H) 11/12/2018   Lab Results  Component Value Date   INSULIN 20.5 07/17/2021   INSULIN 34.1 (H) 10/11/2019   Lab Results  Component Value Date   TSH 2.540 10/11/2019   CBC    Component Value Date/Time   WBC 8.8 03/28/2022 0829   WBC 10.9 (H) 04/05/2020 1632   RBC 4.93 03/28/2022 0829   RBC 4.61 04/05/2020 1632   HGB 13.5 03/28/2022 0829   HCT 41.2 03/28/2022 0829   PLT 319 03/28/2022 0829   MCV 84 03/28/2022 0829   MCH 27.4 03/28/2022 0829   MCH 28.2 04/05/2020 1632   MCHC 32.8 03/28/2022 0829   MCHC 33.2 04/05/2020 1632   RDW 13.4 03/28/2022 0829   Iron Studies No results found for: "IRON", "TIBC", "FERRITIN", "IRONPCTSAT" Lipid Panel     Component Value Date/Time   CHOL 172 10/11/2019 1040   TRIG 155 (H) 10/11/2019 1040   HDL 36 (L) 10/11/2019 1040   CHOLHDL 4.0 11/12/2018 1508   LDLCALC 108 (H) 10/11/2019 1040   Hepatic Function Panel  Component Value Date/Time   PROT 7.1 03/28/2022 0829   ALBUMIN 4.4 03/28/2022 0829   AST 16 03/28/2022 0829   ALT 19 03/28/2022 0829   ALKPHOS 73 03/28/2022 0829   BILITOT <0.2 03/28/2022 0829      Component Value Date/Time   TSH 2.540 10/11/2019 1040   Nutritional Lab Results  Component Value Date   VD25OH 25.2 (L) 11/20/2021   VD25OH 28.4 (L) 07/17/2021   VD25OH 25.2 (L) 06/07/2021     Return in about 1 month (around 09/12/2022) for For Weight Mangement with Dr. Rikki Spearing.Marland Kitchen She was informed of the importance of frequent follow up visits to maximize her success with intensive lifestyle modifications for her multiple health conditions.   ATTESTASTION STATEMENTS:  Reviewed by clinician on day of visit: allergies, medications, problem list, medical history, surgical history, family history, social history, and previous  encounter notes.     Worthy Rancher, MD

## 2022-08-12 NOTE — Assessment & Plan Note (Signed)
Her most recent A1c is improved to 5.6.  She is currently on metformin 1000 mg twice daily without any adverse effects.  She will continue current medication for pharmacoprophylaxis and diabetes prevention.

## 2022-08-13 ENCOUNTER — Other Ambulatory Visit (INDEPENDENT_AMBULATORY_CARE_PROVIDER_SITE_OTHER): Payer: Self-pay | Admitting: Internal Medicine

## 2022-08-13 DIAGNOSIS — E559 Vitamin D deficiency, unspecified: Secondary | ICD-10-CM

## 2022-08-13 LAB — VITAMIN D 25 HYDROXY (VIT D DEFICIENCY, FRACTURES): Vit D, 25-Hydroxy: 29.2 ng/mL — ABNORMAL LOW (ref 30.0–100.0)

## 2022-08-13 LAB — VITAMIN B12: Vitamin B-12: 372 pg/mL (ref 232–1245)

## 2022-08-13 MED ORDER — CHOLECALCIFEROL 1.25 MG (50000 UT) PO TABS: ORAL_TABLET | ORAL | 0 refills | Status: DC

## 2022-09-09 ENCOUNTER — Other Ambulatory Visit (INDEPENDENT_AMBULATORY_CARE_PROVIDER_SITE_OTHER): Payer: Self-pay | Admitting: Internal Medicine

## 2022-09-09 DIAGNOSIS — E559 Vitamin D deficiency, unspecified: Secondary | ICD-10-CM

## 2022-09-12 ENCOUNTER — Telehealth (INDEPENDENT_AMBULATORY_CARE_PROVIDER_SITE_OTHER): Payer: Self-pay | Admitting: Internal Medicine

## 2022-09-12 ENCOUNTER — Ambulatory Visit (INDEPENDENT_AMBULATORY_CARE_PROVIDER_SITE_OTHER): Payer: Medicaid Other | Admitting: Internal Medicine

## 2022-09-12 VITALS — BP 124/71 | HR 82 | Temp 98.2°F | Ht 68.0 in | Wt 293.0 lb

## 2022-09-12 DIAGNOSIS — R7303 Prediabetes: Secondary | ICD-10-CM

## 2022-09-12 DIAGNOSIS — Z6841 Body Mass Index (BMI) 40.0 and over, adult: Secondary | ICD-10-CM | POA: Diagnosis not present

## 2022-09-12 DIAGNOSIS — R632 Polyphagia: Secondary | ICD-10-CM | POA: Diagnosis not present

## 2022-09-12 MED ORDER — SEMAGLUTIDE-WEIGHT MANAGEMENT 0.25 MG/0.5ML ~~LOC~~ SOAJ
0.2500 mg | SUBCUTANEOUS | 0 refills | Status: DC
Start: 2022-09-12 — End: 2022-10-07

## 2022-09-12 NOTE — Assessment & Plan Note (Signed)
Her most recent A1c is improved to 5.6.  She is currently on metformin 1000 mg twice daily without any adverse effects.  She will continue current medication for pharmacoprophylaxis and diabetes prevention.  Patient also benefits from incretin therapy after discussion of benefits and side effect she will be started on semaglutide 0.25 mg once a week

## 2022-09-12 NOTE — Assessment & Plan Note (Signed)
Neurohormonal and psychological.  Under significant stress due to family dynamics.  She is no longer on SSRI because she was experiencing side effects.  She is on topiramate without clinical response.  I feel that she would be a good candidate for incretin therapy.  After discussion of benefits and side effect she will be started on Wegovy.

## 2022-09-12 NOTE — Assessment & Plan Note (Signed)
See obesity treatment note

## 2022-09-12 NOTE — Telephone Encounter (Signed)
Message from plan: Request Reference Number: [id number]. WEGOVY INJ 0.25MG  is approved through 03/15/2023. For further questions, call [employer] at [phone].Marland Kitchen Authorization Expiration Date: March 15, 2023.  Patient sent message via Mychart.

## 2022-09-12 NOTE — Telephone Encounter (Signed)
PA SUBMITTED VIA [employer] FOR WEGOVY. AWAITING A RESPONSE.  Cindy Dixon (Key: [id number])  Your information has been sent to [employer].

## 2022-09-12 NOTE — Progress Notes (Signed)
Office: [phone]  /  Fax: [phone]  WEIGHT SUMMARY AND BIOMETRICS  Vitals Temp: 98.2 F (36.8 C) BP: 124/71 Pulse Rate: 82 SpO2: 97 %   Anthropometric Measurements Height: 5\' 8"  (1.727 m) Weight: 293 lb (132.9 kg) BMI (Calculated): 44.56 Weight at Last Visit: 293 lb Weight Lost Since Last Visit: 0 lb Weight Gained Since Last Visit: 0 lb Starting Weight: 321 lb Total Weight Loss (lbs): 27 lb (12.2 kg)   Body Composition  Body Fat %: 46.2 % Fat Mass (lbs): 135.4 lbs Muscle Mass (lbs): 149.6 lbs Total Body Water (lbs): 102.8 lbs Visceral Fat Rating : 13    No data recorded Today's Visit #: 40  Starting Date: 10/11/19   HPI  Chief Complaint: OBESITY  Cindy Dixon is here to discuss her progress with her obesity treatment plan. She is on the the Category 3 Plan and states she is following her eating plan approximately 40 % of the time. She states she is exercising 30 minutes 5 times per week.  Interval History:  Since last office visit she has maintained.  She recently returned from vacation.  She was on [place] but reports making healthy choices.  She did have to deal with some exposures to highly palatable foods but for the most part try to eat healthy. She reports variable adherence to reduced calorie nutritional plan She has been working on not skipping meals, increasing protein intake at every meal, eating more vegetables, drinking more water, avoiding and / or reducing liquid calories, avoiding or reducing simple and processed carbohydrates, and making healthier choices  Orixegenic Control: Reports problems with appetite and hunger signals.  Reports problems with satiety and satiation.  Denies problems with eating patterns and portion control.  Denies abnormal cravings. Denies feeling deprived or restricted.   Barriers identified: strong hunger signals and appetite, exposure to enticing environments and/or relationships, low volume of physical  acitivity, medical comorbidities, difficulty maintaining a reduced calorie state, and moderate to high levels of stress.   Pharmacotherapy for weight loss: She is currently taking Metformin (off label use for incretin effect and / or insulin resistance and / or diabetes prevention) with adequate clinical response  and without side effects. and Topiramate (off label use, single agent) without clinical response and without side effects..    ASSESSMENT AND PLAN  TREATMENT PLAN FOR OBESITY:  Recommended Dietary Goals  Cindy Dixon is currently in the action stage of change. As such, her goal is to continue weight management plan. She has agreed to: continue current plan  Behavioral Intervention  We discussed the following Behavioral Modification Strategies today: increasing lean protein intake, decreasing simple carbohydrates , increasing vegetables, increasing lower glycemic fruits, increasing fiber rich foods, increasing water intake, continue to practice mindfulness when eating, and planning for success.  Additional resources provided today: Handout on FDA approved anti-obesity medications and adverse effects  Recommended Physical Activity Goals  Cindy Dixon has been advised to work up to 150 minutes of moderate intensity aerobic activity a week and strengthening exercises 2-3 times per week for cardiovascular health, weight loss maintenance and preservation of muscle mass.   She has agreed to :  Think about ways to increase daily physical activity and overcoming barriers to exercise and Increase physical activity in their day and reduce sedentary time (increase NEAT).  Pharmacotherapy We discussed various medication options to help Cindy Dixon with her weight loss efforts and we both agreed to : start anti-obesity medication.  In addition to reduced calorie nutrition plan (RCNP), behavioral  strategies and physical activity, Cindy Dixon would benefit from pharmacotherapy to assist with hunger  signals, satiety and cravings.  She has prediabetes and polyphagia  and medication will reduce obesity-related health risks by inducing weight loss, and help reduce food consumption and adherence to [facility]) . It may also improve QOL by improving self-confidence and reduce the  setbacks associated with metabolic adaptations.  She has lost 30 pounds but has had a difficult time overcoming plateau.  After discussion of treatment options, mechanisms of action, benefits, side effects, contraindications and shared decision making she is agreeable to starting Wegovy 0.25 milligrams once a week. Patient also made aware that medication is indicated for long-term management of obesity and the risk of weight regain following discontinuation of treatment and hence the importance of adhering to medical weight loss plan.  We demonstrated use of device and patient using teach back method was able to demonstrate proper technique.  ASSOCIATED CONDITIONS ADDRESSED TODAY  Polyphagia Assessment & Plan: Neurohormonal and psychological.  Under significant stress due to family dynamics.  She is no longer on SSRI because she was experiencing side effects.  She is on topiramate without clinical response.  I feel that she would be a good candidate for incretin therapy.  After discussion of benefits and side effect she will be started on Wegovy.  Orders: -     Semaglutide-Weight Management; Inject 0.25 mg into the skin once a week for 28 days.  Dispense: 2 mL; Refill: 0  Prediabetes Assessment & Plan: Her most recent A1c is improved to 5.6.  She is currently on metformin 1000 mg twice daily without any adverse effects.  She will continue current medication for pharmacoprophylaxis and diabetes prevention.  Patient also benefits from incretin therapy after discussion of benefits and side effect she will be started on semaglutide 0.25 mg once a week  Orders: -     Semaglutide-Weight Management; Inject 0.25 mg into the skin once  a week for 28 days.  Dispense: 2 mL; Refill: 0  Class 3 severe obesity with serious comorbidity and body mass index (BMI) of 45.0 to 49.9 in adult, unspecified obesity type [facility]) Assessment & Plan: See obesity treatment note  Orders: -     Semaglutide-Weight Management; Inject 0.25 mg into the skin once a week for 28 days.  Dispense: 2 mL; Refill: 0    PHYSICAL EXAM:  Blood pressure 124/71, pulse 82, temperature 98.2 F (36.8 C), height 5\' 8"  (1.727 m), weight 293 lb (132.9 kg), last menstrual period 07/28/2022, SpO2 97%. Body mass index is 44.55 kg/m.  General: She is overweight, cooperative, alert, well developed, and in no acute distress. PSYCH: Has normal mood, affect and thought process.   HEENT: EOMI, sclerae are anicteric. Lungs: Normal breathing effort, no conversational dyspnea. Extremities: No edema.  Neurologic: No gross sensory or motor deficits. No tremors or fasciculations noted.    DIAGNOSTIC DATA REVIEWED:  BMET    Component Value Date/Time   NA 139 03/28/2022 0829   K 4.6 03/28/2022 0829   CL 103 03/28/2022 0829   CO2 23 03/28/2022 0829   GLUCOSE 90 03/28/2022 0829   GLUCOSE 115 (H) 04/05/2020 1632   BUN 13 03/28/2022 0829   CREATININE 0.61 03/28/2022 0829   CALCIUM 9.2 03/28/2022 0829   GFRNONAA >60 04/05/2020 1632   GFRAA 137 10/11/2019 1040   Lab Results  Component Value Date   HGBA1C 5.6 06/12/2022   HGBA1C 5.7 (H) 11/12/2018   Lab Results  Component Value Date  INSULIN 20.5 07/17/2021   INSULIN 34.1 (H) 10/11/2019   Lab Results  Component Value Date   TSH 2.540 10/11/2019   CBC    Component Value Date/Time   WBC 8.8 03/28/2022 0829   WBC 10.9 (H) 04/05/2020 1632   RBC 4.93 03/28/2022 0829   RBC 4.61 04/05/2020 1632   HGB 13.5 03/28/2022 0829   HCT 41.2 03/28/2022 0829   PLT 319 03/28/2022 0829   MCV 84 03/28/2022 0829   MCH 27.4 03/28/2022 0829   MCH 28.2 04/05/2020 1632   MCHC 32.8 03/28/2022 0829   MCHC 33.2 04/05/2020  1632   RDW 13.4 03/28/2022 0829   Iron Studies No results found for: "IRON", "TIBC", "FERRITIN", "IRONPCTSAT" Lipid Panel     Component Value Date/Time   CHOL 172 10/11/2019 1040   TRIG 155 (H) 10/11/2019 1040   HDL 36 (L) 10/11/2019 1040   CHOLHDL 4.0 11/12/2018 1508   LDLCALC 108 (H) 10/11/2019 1040   Hepatic Function Panel     Component Value Date/Time   PROT 7.1 03/28/2022 0829   ALBUMIN 4.4 03/28/2022 0829   AST 16 03/28/2022 0829   ALT 19 03/28/2022 0829   ALKPHOS 73 03/28/2022 0829   BILITOT <0.2 03/28/2022 0829      Component Value Date/Time   TSH 2.540 10/11/2019 1040   Nutritional Lab Results  Component Value Date   VD25OH 29.2 (L) 08/12/2022   VD25OH 25.2 (L) 11/20/2021   VD25OH 28.4 (L) 07/17/2021     Return in about 3 weeks (around 10/03/2022) for For Weight Mangement with Dr. Rikki Spearing.Marland Kitchen She was informed of the importance of frequent follow up visits to maximize her success with intensive lifestyle modifications for her multiple health conditions.   ATTESTASTION STATEMENTS:  Reviewed by clinician on day of visit: allergies, medications, problem list, medical history, surgical history, family history, social history, and previous encounter notes.     Worthy Rancher, MD

## 2022-10-07 ENCOUNTER — Ambulatory Visit (INDEPENDENT_AMBULATORY_CARE_PROVIDER_SITE_OTHER): Payer: Medicaid Other | Admitting: Internal Medicine

## 2022-10-07 ENCOUNTER — Encounter (INDEPENDENT_AMBULATORY_CARE_PROVIDER_SITE_OTHER): Payer: Self-pay | Admitting: Internal Medicine

## 2022-10-07 DIAGNOSIS — R632 Polyphagia: Secondary | ICD-10-CM

## 2022-10-07 DIAGNOSIS — Z6841 Body Mass Index (BMI) 40.0 and over, adult: Secondary | ICD-10-CM | POA: Diagnosis not present

## 2022-10-07 DIAGNOSIS — R7303 Prediabetes: Secondary | ICD-10-CM

## 2022-10-07 DIAGNOSIS — E559 Vitamin D deficiency, unspecified: Secondary | ICD-10-CM | POA: Diagnosis not present

## 2022-10-07 MED ORDER — CHOLECALCIFEROL 1.25 MG (50000 UT) PO TABS: ORAL_TABLET | ORAL | 0 refills | Status: DC

## 2022-10-07 MED ORDER — SEMAGLUTIDE-WEIGHT MANAGEMENT 0.5 MG/0.5ML ~~LOC~~ SOAJ
0.5000 mg | SUBCUTANEOUS | 0 refills | Status: DC
Start: 2022-10-07 — End: 2022-10-10
  Filled 2022-10-09: qty 2, 28d supply, fill #0

## 2022-10-07 NOTE — Assessment & Plan Note (Signed)
Her most recent A1c is improved to 5.6.  She is currently on metformin 1000 mg twice daily without any adverse effects.  She will continue current medication for pharmacoprophylaxis and diabetes prevention.  Patient is also on semaglutide 0.25 mg once weekly will be increased to 0.5 mg once a week for weight management.

## 2022-10-07 NOTE — Assessment & Plan Note (Signed)
 See obesity treatment note

## 2022-10-07 NOTE — Progress Notes (Signed)
Office: [phone]  /  Fax: [phone]  WEIGHT SUMMARY AND BIOMETRICS  Vitals Temp: 98.3 F (36.8 C) BP: 114/78 Pulse Rate: 71 SpO2: 99 %   Anthropometric Measurements Height: 5\' 8"  (1.727 m) Weight: 285 lb (129.3 kg) BMI (Calculated): 43.34 Weight at Last Visit: 293 lb Weight Lost Since Last Visit: 8 lb Weight Gained Since Last Visit: 0 lb Starting Weight: 321 lb Total Weight Loss (lbs): 35 lb (15.9 kg)   Body Composition  Body Fat %: 44.9 % Fat Mass (lbs): 128 lbs Muscle Mass (lbs): 149.4 lbs Total Body Water (lbs): 103.2 lbs Visceral Fat Rating : 12    No data recorded Today's Visit #: 41  Starting Date: 10/11/19   HPI  Chief Complaint: OBESITY  Cindy Dixon is here to discuss her progress with her obesity treatment plan. She is on the the Category 3 Plan and states she is following her eating plan approximately 80 % of the time. She states she is exercising 40 minutes 5 times per week.  Interval History:  Since last office visit she has lost 8 pounds.  Last office visit she was started on Wegovy 0.25 milligrams once a week.  She denies any adverse effects to medication.  She reports adequate satiety and satiation.  Cravings have improved.  She denies feeling deprived or restricted. She reports good adherence to reduced calorie nutritional plan. She has been working on not skipping meals, increasing protein intake at every meal, drinking more water, and avoiding and / or reducing liquid calories    Barriers identified: strong hunger signals and appetite.   Pharmacotherapy for weight loss: She is currently taking Wegovy with adequate clinical response  and without side effects..    ASSESSMENT AND PLAN  TREATMENT PLAN FOR OBESITY:  Recommended Dietary Goals  [name] is currently in the action stage of change. As such, her goal is to continue weight management plan. She has agreed to: continue current plan  Behavioral Intervention  We  discussed the following Behavioral Modification Strategies today: increasing lean protein intake, decreasing simple carbohydrates , increasing vegetables, increasing lower glycemic fruits, increasing water intake, continue to practice mindfulness when eating, and planning for success.  Additional resources provided today: None  Recommended Physical Activity Goals  [name] has been advised to work up to 150 minutes of moderate intensity aerobic activity a week and strengthening exercises 2-3 times per week for cardiovascular health, weight loss maintenance and preservation of muscle mass.   She has agreed to :  Think about ways to increase daily physical activity and overcoming barriers to exercise and Increase physical activity in their day and reduce sedentary time (increase NEAT).  Pharmacotherapy We discussed various medication options to help [name] with her weight loss efforts and we both agreed to : increase Wegovy 2.5 mg once a week  ASSOCIATED CONDITIONS ADDRESSED TODAY  Polyphagia Assessment & Plan: Neurohormonal and psychological.  Under significant stress due to family dynamics.  She is no longer on SSRI because she was experiencing side effects.  Had been on topiramate without clinical response.  She has responded well to [facility] and will continue medication.  He will be increased to 0.5 mg once a week   Prediabetes Assessment & Plan: Her most recent A1c is improved to 5.6.  She is currently on metformin 1000 mg twice daily without any adverse effects.  She will continue current medication for pharmacoprophylaxis and diabetes prevention.  Patient is also on semaglutide 0.25 mg once weekly will be increased to  0.5 mg once a week for weight management.   Class 3 severe obesity with serious comorbidity and body mass index (BMI) of 45.0 to 49.9 in adult, unspecified obesity type [facility]) Assessment & Plan: See obesity treatment note  Orders: -     Semaglutide-Weight  Management; Inject 0.5 mg into the skin once a week for 28 days.  Dispense: 2 mL; Refill: 0  Vitamin D deficiency Assessment & Plan: Vitamin D levels are still low.  I would like for her to continue high-dose vitamin D supplementation for another 4 months.  Orders: -     Cholecalciferol; Take one capsule once per week  Dispense: 16 tablet; Refill: 0    PHYSICAL EXAM:  Blood pressure 114/78, pulse 71, temperature 98.3 F (36.8 C), height 5\' 8"  (1.727 m), weight 285 lb (129.3 kg), last menstrual period 09/21/2022, SpO2 99%. Body mass index is 43.33 kg/m.  General: She is overweight, cooperative, alert, well developed, and in no acute distress. PSYCH: Has normal mood, affect and thought process.   HEENT: EOMI, sclerae are anicteric. Lungs: Normal breathing effort, no conversational dyspnea. Extremities: No edema.  Neurologic: No gross sensory or motor deficits. No tremors or fasciculations noted.    DIAGNOSTIC DATA REVIEWED:  BMET    Component Value Date/Time   NA 139 03/28/2022 0829   K 4.6 03/28/2022 0829   CL 103 03/28/2022 0829   CO2 23 03/28/2022 0829   GLUCOSE 90 03/28/2022 0829   GLUCOSE 115 (H) 04/05/2020 1632   BUN 13 03/28/2022 0829   CREATININE 0.61 03/28/2022 0829   CALCIUM 9.2 03/28/2022 0829   GFRNONAA >60 04/05/2020 1632   GFRAA 137 10/11/2019 1040   Lab Results  Component Value Date   HGBA1C 5.6 06/12/2022   HGBA1C 5.7 (H) 11/12/2018   Lab Results  Component Value Date   INSULIN 20.5 07/17/2021   INSULIN 34.1 (H) 10/11/2019   Lab Results  Component Value Date   TSH 2.540 10/11/2019   CBC    Component Value Date/Time   WBC 8.8 03/28/2022 0829   WBC 10.9 (H) 04/05/2020 1632   RBC 4.93 03/28/2022 0829   RBC 4.61 04/05/2020 1632   HGB 13.5 03/28/2022 0829   HCT 41.2 03/28/2022 0829   PLT 319 03/28/2022 0829   MCV 84 03/28/2022 0829   MCH 27.4 03/28/2022 0829   MCH 28.2 04/05/2020 1632   MCHC 32.8 03/28/2022 0829   MCHC 33.2 04/05/2020  1632   RDW 13.4 03/28/2022 0829   Iron Studies No results found for: "IRON", "TIBC", "FERRITIN", "IRONPCTSAT" Lipid Panel     Component Value Date/Time   CHOL 172 10/11/2019 1040   TRIG 155 (H) 10/11/2019 1040   HDL 36 (L) 10/11/2019 1040   CHOLHDL 4.0 11/12/2018 1508   LDLCALC 108 (H) 10/11/2019 1040   Hepatic Function Panel     Component Value Date/Time   PROT 7.1 03/28/2022 0829   ALBUMIN 4.4 03/28/2022 0829   AST 16 03/28/2022 0829   ALT 19 03/28/2022 0829   ALKPHOS 73 03/28/2022 0829   BILITOT <0.2 03/28/2022 0829      Component Value Date/Time   TSH 2.540 10/11/2019 1040   Nutritional Lab Results  Component Value Date   VD25OH 29.2 (L) 08/12/2022   VD25OH 25.2 (L) 11/20/2021   VD25OH 28.4 (L) 07/17/2021     Return in about 3 weeks (around 10/28/2022) for with Shawn Rayburn in 3 weeks and in 6 weeks with me.. She was informed of the importance  of frequent follow up visits to maximize her success with intensive lifestyle modifications for her multiple health conditions.   ATTESTASTION STATEMENTS:  Reviewed by clinician on day of visit: allergies, medications, problem list, medical history, surgical history, family history, social history, and previous encounter notes.     Worthy Rancher, MD

## 2022-10-07 NOTE — Assessment & Plan Note (Signed)
Vitamin D levels are still low.  I would like for her to continue high-dose vitamin D supplementation for another 4 months.

## 2022-10-07 NOTE — Assessment & Plan Note (Signed)
Neurohormonal and psychological.  Under significant stress due to family dynamics.  She is no longer on SSRI because she was experiencing side effects.  Had been on topiramate without clinical response.  She has responded well to [facility] and will continue medication.  He will be increased to 0.5 mg once a week

## 2022-10-09 ENCOUNTER — Other Ambulatory Visit: Payer: Self-pay

## 2022-10-09 ENCOUNTER — Other Ambulatory Visit (HOSPITAL_COMMUNITY): Payer: Self-pay

## 2022-10-10 ENCOUNTER — Other Ambulatory Visit (INDEPENDENT_AMBULATORY_CARE_PROVIDER_SITE_OTHER): Payer: Self-pay | Admitting: Internal Medicine

## 2022-10-10 ENCOUNTER — Other Ambulatory Visit (HOSPITAL_COMMUNITY): Payer: Self-pay

## 2022-10-10 MED ORDER — SEMAGLUTIDE-WEIGHT MANAGEMENT 0.5 MG/0.5ML ~~LOC~~ SOAJ
0.5000 mg | SUBCUTANEOUS | 0 refills | Status: DC
Start: 2022-10-10 — End: 2022-10-29
  Filled 2022-10-10: qty 2, 28d supply, fill #0

## 2022-10-29 ENCOUNTER — Ambulatory Visit (INDEPENDENT_AMBULATORY_CARE_PROVIDER_SITE_OTHER): Payer: Medicaid Other | Admitting: Family Medicine

## 2022-10-29 ENCOUNTER — Encounter (INDEPENDENT_AMBULATORY_CARE_PROVIDER_SITE_OTHER): Payer: Self-pay | Admitting: Family Medicine

## 2022-10-29 ENCOUNTER — Other Ambulatory Visit (HOSPITAL_COMMUNITY): Payer: Self-pay

## 2022-10-29 VITALS — BP 110/73 | HR 62 | Temp 98.2°F | Ht 68.0 in | Wt 275.0 lb

## 2022-10-29 DIAGNOSIS — R7303 Prediabetes: Secondary | ICD-10-CM

## 2022-10-29 DIAGNOSIS — Z6841 Body Mass Index (BMI) 40.0 and over, adult: Secondary | ICD-10-CM | POA: Diagnosis not present

## 2022-10-29 DIAGNOSIS — E559 Vitamin D deficiency, unspecified: Secondary | ICD-10-CM

## 2022-10-29 MED ORDER — SEMAGLUTIDE-WEIGHT MANAGEMENT 1 MG/0.5ML ~~LOC~~ SOAJ
1.0000 mg | SUBCUTANEOUS | 0 refills | Status: DC
  Filled 2022-10-29: qty 2, 28d supply, fill #0

## 2022-10-29 NOTE — Progress Notes (Signed)
Cindy Dixon, D.O.  ABFM, ABOM Specializing in Clinical Bariatric Medicine  Office located at: [address] [place], [place]  [address]     Assessment and Plan:   Medications Discontinued During This Encounter  Medication Reason   Semaglutide-Weight Management 0.5 MG/0.5ML SOAJ      Meds ordered this encounter  Medications   Semaglutide-Weight Management 1 MG/0.5ML SOAJ    Sig: Inject 1 mg into the skin once a week.    Dispense:  2 mL    Refill:  0     Pre-diabetes Assessment & Plan: Lab Results  Component Value Date   HGBA1C 5.6 06/12/2022   HGBA1C 5.5 11/20/2021   HGBA1C 5.5 07/17/2021   INSULIN 20.5 07/17/2021   INSULIN 25.8 (H) 04/17/2020   INSULIN 34.1 (H) 10/11/2019    Condition treated with Semaglutide 0.5 mg once a week and Metformin 1,000 mg bid. Reports that she is tolerating both medications well, denies any GI upset. Hunger and cravings are well controlled.   Continue to decrease simple carbs/ sugars; increase fiber and proteins -> follow her meal plan. We will recheck A1c and fasting insulin level as deemed appropriate. Will refill Metformin today. Will increase Semaglutide to 1 mg once a wk for weight management.    Vitamin D deficiency Assessment & Plan: Lab Results  Component Value Date   VD25OH 29.2 (L) 08/12/2022   VD25OH 25.2 (L) 11/20/2021   VD25OH 28.4 (L) 07/17/2021   Condition treated with ERGO 50,000 units once per week. Continue with their weight loss efforts and ERGO at current dose. Will recheck levels as deemed appropriate.    Class 3 severe obesity with serious comorbidity and body mass index (BMI) of 45.0 to 49.9 in adult, unspecified obesity type [facility]) Assessment & Plan: Since last office visit on 10/07/22 patient's muscle mass has decreased by 1.6 lb. Fat mass has decreased by 8.2 lb. Total body water has decreased by 2.2 lb.  Counseling done on how various foods will affect these numbers and how to maximize  success  Total lbs lost to date: 10 lbs  Total weight loss percentage to date: 3.51%  No change to meal plan - see Subjective   Behavioral Intervention Additional resources provided today: NA  Evidence-based interventions for health behavior change were utilized today including the discussion of self monitoring techniques, problem-solving barriers and SMART goal setting techniques.   Regarding patient's less desirable eating habits and patterns, we employed the technique of small changes.  Pt will specifically work on: NA for next visit.    FOLLOW UP: Return in about 4 weeks (around 11/26/2022). She was informed of the importance of frequent follow up visits to maximize her success with intensive lifestyle modifications for her multiple health conditions.  Subjective:   Chief complaint: Obesity Cindy Dixon is here to discuss her progress with her obesity treatment plan. She is on the Category 3 Plan and states she is following her eating plan approximately 90% of the time. She states she is walking 45 minutes 7 days per week.  Interval History:  Cindy Dixon is here for a follow up office visit and is accompanied by an interpreter. This is my first time meeting with Cindy Dixon; she is being managed and treated by Dr.Maldonado. Since last OV, Cindy Dixon reports  doing well with her meal plan and exercise regiment. For breakfast, she typically has oatmeal and eggs. For lunch, she sometimes buys a prepared grilled house salad from [employer]. For dinner, she  consumes shrimp, salmon, tuna etc. Drinks roughly 10 bottles of water daily.   Pharmacotherapy for weight loss: She is currently taking  Semaglutide 0.5 mg once a wk and Metformin 1,000 bid   for medical weight loss.  Denies side effects.    Review of Systems:  Pertinent positives were addressed with patient today.  Reviewed by clinician on day of visit: allergies, medications, problem list, medical history, surgical history, family  history, social history, and previous encounter notes.  Weight Summary and Biometrics   Weight Lost Since Last Visit: 10 lb  Weight Gained Since Last Visit: 0   Vitals Temp: 98.2 F (36.8 C) BP: 110/73 Pulse Rate: 62 SpO2: 100 %   Anthropometric Measurements Height: 5\' 8"  (1.727 m) Weight: 275 lb (124.7 kg) BMI (Calculated): 41.82 Weight at Last Visit: 285 lb Weight Lost Since Last Visit: 10 lb Weight Gained Since Last Visit: 0 Starting Weight: 321 lb Total Weight Loss (lbs): 46 lb (20.9 kg)   Body Composition  Body Fat %: 43.5 % Fat Mass (lbs): 119.8 lbs Muscle Mass (lbs): 147.8 lbs Total Body Water (lbs): 101 lbs Visceral Fat Rating : 11   Other Clinical Data Fasting: no Labs: no Today's Visit #: 42 Starting Date: 10/11/19   Objective:   PHYSICAL EXAM: Blood pressure 110/73, pulse 62, temperature 98.2 F (36.8 C), height 5\' 8"  (1.727 m), weight 275 lb (124.7 kg), last menstrual period 09/21/2022, SpO2 100%. Body mass index is 41.81 kg/m.  General: Well Developed, well nourished, and in no acute distress.  HEENT: Normocephalic, atraumatic Skin: Warm and dry, cap RF less 2 sec, good turgor Chest:  Normal excursion, shape, no gross abn Respiratory: speaking in full sentences, no conversational dyspnea NeuroM-Sk: Ambulates w/o assistance, moves * 4 Psych: A and O *3, insight good, mood-full  DIAGNOSTIC DATA REVIEWED:  BMET    Component Value Date/Time   NA 139 03/28/2022 0829   K 4.6 03/28/2022 0829   CL 103 03/28/2022 0829   CO2 23 03/28/2022 0829   GLUCOSE 90 03/28/2022 0829   GLUCOSE 115 (H) 04/05/2020 1632   BUN 13 03/28/2022 0829   CREATININE 0.61 03/28/2022 0829   CALCIUM 9.2 03/28/2022 0829   GFRNONAA >60 04/05/2020 1632   GFRAA 137 10/11/2019 1040   Lab Results  Component Value Date   HGBA1C 5.6 06/12/2022   HGBA1C 5.7 (H) 11/12/2018   Lab Results  Component Value Date   INSULIN 20.5 07/17/2021   INSULIN 34.1 (H) 10/11/2019    Lab Results  Component Value Date   TSH 2.540 10/11/2019   CBC    Component Value Date/Time   WBC 8.8 03/28/2022 0829   WBC 10.9 (H) 04/05/2020 1632   RBC 4.93 03/28/2022 0829   RBC 4.61 04/05/2020 1632   HGB 13.5 03/28/2022 0829   HCT 41.2 03/28/2022 0829   PLT 319 03/28/2022 0829   MCV 84 03/28/2022 0829   MCH 27.4 03/28/2022 0829   MCH 28.2 04/05/2020 1632   MCHC 32.8 03/28/2022 0829   MCHC 33.2 04/05/2020 1632   RDW 13.4 03/28/2022 0829   Iron Studies No results found for: "IRON", "TIBC", "FERRITIN", "IRONPCTSAT" Lipid Panel     Component Value Date/Time   CHOL 172 10/11/2019 1040   TRIG 155 (H) 10/11/2019 1040   HDL 36 (L) 10/11/2019 1040   CHOLHDL 4.0 11/12/2018 1508   LDLCALC 108 (H) 10/11/2019 1040   Hepatic Function Panel     Component Value Date/Time   PROT 7.1 03/28/2022  6644   ALBUMIN 4.4 03/28/2022 0829   AST 16 03/28/2022 0829   ALT 19 03/28/2022 0829   ALKPHOS 73 03/28/2022 0829   BILITOT <0.2 03/28/2022 0829      Component Value Date/Time   TSH 2.540 10/11/2019 1040   Nutritional Lab Results  Component Value Date   VD25OH 29.2 (L) 08/12/2022   VD25OH 25.2 (L) 11/20/2021   VD25OH 28.4 (L) 07/17/2021    Attestations:   I, Special Puri, acting as a Stage manager for Thomasene Lot, DO., have compiled all relevant documentation for today's office visit on behalf of Thomasene Lot, DO, while in the presence of [employer], DO.  I have reviewed the above documentation for accuracy and completeness, and I agree with the above. Cindy Dixon, D.O.  The 21st Century Cures Act was signed into law in 2016 which includes the topic of electronic health records.  This provides immediate access to information in MyChart.  This includes consultation notes, operative notes, office notes, lab results and pathology reports.  If you have any questions about what you read please let us know at your next visit so we can discuss your concerns and  take corrective action if need be.  We are right here with you.

## 2022-11-02 ENCOUNTER — Other Ambulatory Visit (INDEPENDENT_AMBULATORY_CARE_PROVIDER_SITE_OTHER): Payer: Self-pay | Admitting: Internal Medicine

## 2022-11-02 DIAGNOSIS — E559 Vitamin D deficiency, unspecified: Secondary | ICD-10-CM

## 2022-11-19 ENCOUNTER — Telehealth (INDEPENDENT_AMBULATORY_CARE_PROVIDER_SITE_OTHER): Payer: Self-pay

## 2022-11-19 ENCOUNTER — Other Ambulatory Visit (HOSPITAL_COMMUNITY): Payer: Self-pay

## 2022-11-19 ENCOUNTER — Ambulatory Visit (INDEPENDENT_AMBULATORY_CARE_PROVIDER_SITE_OTHER): Payer: Medicaid Other | Admitting: Internal Medicine

## 2022-11-19 ENCOUNTER — Encounter (INDEPENDENT_AMBULATORY_CARE_PROVIDER_SITE_OTHER): Payer: Self-pay | Admitting: Internal Medicine

## 2022-11-19 VITALS — BP 111/75 | HR 66 | Temp 98.1°F | Ht 68.0 in | Wt 263.0 lb

## 2022-11-19 DIAGNOSIS — R7303 Prediabetes: Secondary | ICD-10-CM

## 2022-11-19 DIAGNOSIS — E66813 Obesity, class 3: Secondary | ICD-10-CM

## 2022-11-19 DIAGNOSIS — E559 Vitamin D deficiency, unspecified: Secondary | ICD-10-CM | POA: Diagnosis not present

## 2022-11-19 DIAGNOSIS — Z6841 Body Mass Index (BMI) 40.0 and over, adult: Secondary | ICD-10-CM | POA: Diagnosis not present

## 2022-11-19 MED ORDER — CHOLECALCIFEROL 1.25 MG (50000 UT) PO CAPS
50000.0000 [IU] | ORAL_CAPSULE | ORAL | 0 refills | Status: DC
  Filled 2022-11-19: qty 4, 28d supply, fill #0

## 2022-11-19 MED ORDER — METFORMIN HCL 1000 MG PO TABS
1000.0000 mg | ORAL_TABLET | Freq: Two times a day (BID) | ORAL | 1 refills | Status: DC
  Filled 2022-11-19 – 2022-11-21 (×2): qty 180, 90d supply, fill #0

## 2022-11-19 MED ORDER — SEMAGLUTIDE-WEIGHT MANAGEMENT 1 MG/0.5ML ~~LOC~~ SOAJ
1.0000 mg | SUBCUTANEOUS | 0 refills | Status: DC
  Filled 2022-11-19 – 2022-11-21 (×2): qty 2, 28d supply, fill #0

## 2022-11-19 NOTE — Assessment & Plan Note (Signed)
Vitamin D levels are still low.  I would like for her to continue high-dose vitamin D supplementation for another 4 months.

## 2022-11-19 NOTE — Telephone Encounter (Signed)
Prior Auth submitted via cover my meds for Wegovy 1 mg. [employer] [facility] [place] has reviewed the request for [employer] Inj 0.25mg  submitted by Cindy Dixon on behalf of Cindy Dixon on 09/12/2022. After review, the request for service is: Approved through 03/15/2023

## 2022-11-19 NOTE — Assessment & Plan Note (Signed)
Cindy Dixon has lost 60 pounds, 30 pounds since starting Wegovy this makes her to a total of 90% of total body weight.  She reports having more energy and feeling more motivated has started to workout with family.  She is also excited about changes in clothe size.  She is having adequate orixegenic control on current dose and will continue Wegovy 1 mg once a week.

## 2022-11-19 NOTE — Assessment & Plan Note (Signed)
Her most recent A1c is improved to 5.6.  She is currently on metformin 1000 mg twice daily and Wegovy 1 mg once a week without any adverse effects.  She will continue current medication for pharmacoprophylaxis and diabetes prevention.

## 2022-11-19 NOTE — Progress Notes (Signed)
Office: [phone]  /  Fax: [phone]  WEIGHT SUMMARY AND BIOMETRICS  Vitals Temp: 98.1 F (36.7 C) BP: 111/75 Pulse Rate: 66 SpO2: 98 %   Anthropometric Measurements Height: 5\' 8"  (1.727 m) Weight: 263 lb (119.3 kg) BMI (Calculated): 40 Weight at Last Visit: 275 lb Weight Lost Since Last Visit: 12 lb Weight Gained Since Last Visit: 0 lb Starting Weight: 321 lb Total Weight Loss (lbs): 58 lb (26.3 kg)   Body Composition  Body Fat %: 41.2 % Fat Mass (lbs): 108.6 lbs Muscle Mass (lbs): 147.4 lbs Total Body Water (lbs): 97.8 lbs Visceral Fat Rating : 10    No data recorded Today's Visit #: 43  Starting Date: 10/11/19   HPI  Chief Complaint: OBESITY  Cindy Dixon is here to discuss her progress with her obesity treatment plan. She is on the the Category 3 Plan and states she is following her eating plan approximately 90% of the time. She states she is exercising 70 minutes 3 times per week.  Combination of aerobic and strengthening  Interval History:  Since last office visit she has lost 12 pounds. She reports good adherence to reduced calorie nutritional plan. She has been working on reading food labels, not skipping meals, increasing protein intake at every meal, drinking more water, making healthier choices, reducing portion sizes, and incorporating more whole foods  Orexigenic Control: Denies problems with appetite and hunger signals.  Denies problems with satiety and satiation.  Denies problems with eating patterns and portion control.  Denies abnormal cravings. Denies feeling deprived or restricted.   Barriers identified: none.   Pharmacotherapy for weight loss: She is currently taking Wegovy with adequate clinical response  and without side effects..    ASSESSMENT AND PLAN  TREATMENT PLAN FOR OBESITY:  Recommended Dietary Goals  Cindy Dixon is currently in the action stage of change. As such, her goal is to continue weight management  plan. She has agreed to: continue current plan  Behavioral Intervention  We discussed the following Behavioral Modification Strategies today: continue to work on maintaining a reduced calorie state, getting the recommended amount of protein, incorporating whole foods, making healthy choices, staying well hydrated and practicing mindfulness when eating..  Additional resources provided today: None  Recommended Physical Activity Goals  Cindy Dixon has been advised to work up to 150 minutes of moderate intensity aerobic activity a week and strengthening exercises 2-3 times per week for cardiovascular health, weight loss maintenance and preservation of muscle mass.   She has agreed to :  Continue current level of physical activity   Pharmacotherapy We discussed various medication options to help Cindy Dixon with her weight loss efforts and we both agreed to : continue with nutritional and behavioral strategies  ASSOCIATED CONDITIONS ADDRESSED TODAY  Class 3 severe obesity with serious comorbidity and body mass index (BMI) of 45.0 to 49.9 in adult, unspecified obesity type Cindy Dixon) Assessment & Plan: Cindy Dixon has lost 60 pounds, 30 pounds since starting Wegovy this makes her to a total of 90% of total body weight.  She reports having more energy and feeling more motivated has started to workout with family.  She is also excited about changes in clothe size.  She is having adequate orixegenic control on current dose and will continue Wegovy 1 mg once a week.  Orders: -     Semaglutide-Weight Management; Inject 1 mg into the skin once a week.  Dispense: 2 mL; Refill: 0  Pre-diabetes Assessment & Plan: Her most recent A1c is improved to  5.6.  She is currently on metformin 1000 mg twice daily and Wegovy 1 mg once a week without any adverse effects.  She will continue current medication for pharmacoprophylaxis and diabetes prevention.    Orders: -     metFORMIN HCl; Take 1 tablet (1,000 mg total)  by mouth 2 (two) times daily with a meal.  Dispense: 180 tablet; Refill: 1  Vitamin D deficiency Assessment & Plan: Vitamin D levels are still low.  I would like for her to continue high-dose vitamin D supplementation for another 4 months.  Orders: -     Cholecalciferol; Take 1 capsule (50,000 Units total) by mouth once a week.  Dispense: 16 capsule; Refill: 0    PHYSICAL EXAM:  Blood pressure 111/75, pulse 66, temperature 98.1 F (36.7 C), height 5\' 8"  (1.727 m), weight 263 lb (119.3 kg), last menstrual period 11/11/2022, SpO2 98%. Body mass index is 39.99 kg/m.  General: She is overweight, cooperative, alert, well developed, and in no acute distress. PSYCH: Has normal mood, affect and thought process.   HEENT: EOMI, sclerae are anicteric. Lungs: Normal breathing effort, no conversational dyspnea. Extremities: No edema.  Neurologic: No gross sensory or motor deficits. No tremors or fasciculations noted.    DIAGNOSTIC DATA REVIEWED:  BMET    Component Value Date/Time   NA 139 03/28/2022 0829   K 4.6 03/28/2022 0829   CL 103 03/28/2022 0829   CO2 23 03/28/2022 0829   GLUCOSE 90 03/28/2022 0829   GLUCOSE 115 (H) 04/05/2020 1632   BUN 13 03/28/2022 0829   CREATININE 0.61 03/28/2022 0829   CALCIUM 9.2 03/28/2022 0829   GFRNONAA >60 04/05/2020 1632   GFRAA 137 10/11/2019 1040   Lab Results  Component Value Date   HGBA1C 5.6 06/12/2022   HGBA1C 5.7 (H) 11/12/2018   Lab Results  Component Value Date   INSULIN 20.5 07/17/2021   INSULIN 34.1 (H) 10/11/2019   Lab Results  Component Value Date   TSH 2.540 10/11/2019   CBC    Component Value Date/Time   WBC 8.8 03/28/2022 0829   WBC 10.9 (H) 04/05/2020 1632   RBC 4.93 03/28/2022 0829   RBC 4.61 04/05/2020 1632   HGB 13.5 03/28/2022 0829   HCT 41.2 03/28/2022 0829   PLT 319 03/28/2022 0829   MCV 84 03/28/2022 0829   MCH 27.4 03/28/2022 0829   MCH 28.2 04/05/2020 1632   MCHC 32.8 03/28/2022 0829   MCHC 33.2  04/05/2020 1632   RDW 13.4 03/28/2022 0829   Iron Studies No results found for: "IRON", "TIBC", "FERRITIN", "IRONPCTSAT" Lipid Panel     Component Value Date/Time   CHOL 172 10/11/2019 1040   TRIG 155 (H) 10/11/2019 1040   HDL 36 (L) 10/11/2019 1040   CHOLHDL 4.0 11/12/2018 1508   LDLCALC 108 (H) 10/11/2019 1040   Hepatic Function Panel     Component Value Date/Time   PROT 7.1 03/28/2022 0829   ALBUMIN 4.4 03/28/2022 0829   AST 16 03/28/2022 0829   ALT 19 03/28/2022 0829   ALKPHOS 73 03/28/2022 0829   BILITOT <0.2 03/28/2022 0829      Component Value Date/Time   TSH 2.540 10/11/2019 1040   Nutritional Lab Results  Component Value Date   VD25OH 29.2 (L) 08/12/2022   VD25OH 25.2 (L) 11/20/2021   VD25OH 28.4 (L) 07/17/2021     Return in about 4 weeks (around 12/17/2022) for For Weight Mangement with Dr. Rikki Spearing.Marland Kitchen She was informed of the importance of frequent  follow up visits to maximize her success with intensive lifestyle modifications for her multiple health conditions.   ATTESTASTION STATEMENTS:  Reviewed by clinician on day of visit: allergies, medications, problem list, medical history, surgical history, family history, social history, and previous encounter notes.     Worthy Rancher, MD

## 2022-11-20 NOTE — Telephone Encounter (Signed)
error 

## 2022-11-21 ENCOUNTER — Other Ambulatory Visit (HOSPITAL_COMMUNITY): Payer: Self-pay

## 2022-11-21 ENCOUNTER — Other Ambulatory Visit: Payer: Self-pay

## 2022-11-21 ENCOUNTER — Encounter (HOSPITAL_COMMUNITY): Payer: Self-pay

## 2022-11-21 NOTE — Telephone Encounter (Signed)
Patient requires a Research officer, trade [employer] Patient called in requesting that scripts be sent to her pharmacy  for her Wegovy and Vitamin D. Patient stated she has already received her Metformin. Please send prescriptions for [facility] and Vitamin D send to [facility] at [facility].   Thank you

## 2022-11-25 ENCOUNTER — Other Ambulatory Visit (HOSPITAL_COMMUNITY): Payer: Self-pay

## 2022-12-09 ENCOUNTER — Other Ambulatory Visit: Payer: Self-pay | Admitting: Nurse Practitioner

## 2022-12-09 DIAGNOSIS — F419 Anxiety disorder, unspecified: Secondary | ICD-10-CM

## 2022-12-17 ENCOUNTER — Encounter (INDEPENDENT_AMBULATORY_CARE_PROVIDER_SITE_OTHER): Payer: Self-pay | Admitting: Internal Medicine

## 2022-12-17 ENCOUNTER — Other Ambulatory Visit (HOSPITAL_COMMUNITY): Payer: Self-pay

## 2022-12-17 ENCOUNTER — Ambulatory Visit (INDEPENDENT_AMBULATORY_CARE_PROVIDER_SITE_OTHER): Payer: Medicaid Other | Admitting: Internal Medicine

## 2022-12-17 VITALS — BP 114/79 | HR 66 | Temp 98.2°F | Ht 68.0 in | Wt 253.0 lb

## 2022-12-17 DIAGNOSIS — R7303 Prediabetes: Secondary | ICD-10-CM | POA: Diagnosis not present

## 2022-12-17 DIAGNOSIS — R632 Polyphagia: Secondary | ICD-10-CM | POA: Diagnosis not present

## 2022-12-17 DIAGNOSIS — E66813 Obesity, class 3: Secondary | ICD-10-CM | POA: Diagnosis not present

## 2022-12-17 DIAGNOSIS — Z6841 Body Mass Index (BMI) 40.0 and over, adult: Secondary | ICD-10-CM

## 2022-12-17 MED ORDER — SEMAGLUTIDE-WEIGHT MANAGEMENT 1 MG/0.5ML ~~LOC~~ SOAJ
1.0000 mg | SUBCUTANEOUS | 0 refills | Status: DC
  Filled 2022-12-17: qty 2, 28d supply, fill #0

## 2022-12-17 MED ORDER — ADULT MULTIVITAMIN W/MINERALS CH: 1.0000 | ORAL_TABLET | Freq: Every day | ORAL | Status: AC

## 2022-12-17 NOTE — Assessment & Plan Note (Signed)
Neurohormonal and psychological.  Under significant stress due to family dynamics.  She is no longer on SSRI because she was experiencing side effects.  Had been on topiramate without clinical response.  She has responded well to [facility] and will continue medication.

## 2022-12-17 NOTE — Progress Notes (Signed)
Office: [phone]  /  Fax: [phone]  WEIGHT SUMMARY AND BIOMETRICS  Vitals Temp: 98.2 F (36.8 C) BP: 114/79 Pulse Rate: 66 SpO2: 98 %   Anthropometric Measurements Height: 5\' 8"  (1.727 m) Weight: 253 lb (114.8 kg) BMI (Calculated): 38.48 Weight at Last Visit: 263 lb Weight Lost Since Last Visit: 10 lb Weight Gained Since Last Visit: 0 lb Starting Weight: 321 lb Total Weight Loss (lbs): 68 lb (30.8 kg)   Body Composition  Body Fat %: 40.9 % Fat Mass (lbs): 103.6 lbs Muscle Mass (lbs): 142 lbs Total Body Water (lbs): 94.4 lbs Visceral Fat Rating : 10    No data recorded Today's Visit #: 44  Starting Date: 10/11/19   HPI  Chief Complaint: OBESITY  Cindy Dixon is here to discuss her progress with her obesity treatment plan. She is on the the Category 3 Plan and states she is following her eating plan approximately 80 % of the time. She states she is exercising 40 minutes 5 times per week.  Interval History:  Since last office visit she has lost 10 pounds. BIA shows decreased BF and VF. Muscle to fat loss ratio of 10% She reports good adherence to reduced calorie nutritional plan. She has been working on reading food labels, not skipping meals, increasing protein intake at every meal, drinking more water, making healthier choices, reducing portion sizes, and incorporating more whole foods  Orexigenic Control: Denies problems with appetite and hunger signals.  Denies problems with satiety and satiation.  Denies problems with eating patterns and portion control.  Denies abnormal cravings. Denies feeling deprived or restricted.   Barriers identified: none.   Pharmacotherapy for weight loss: She is currently taking Wegovy with adequate clinical response  and without side effects..    ASSESSMENT AND PLAN  TREATMENT PLAN FOR OBESITY:  Recommended Dietary Goals  Cindy Dixon is currently in the action stage of change. As such, her goal is to continue  weight management plan. She has agreed to: continue current plan  Behavioral Intervention  We discussed the following Behavioral Modification Strategies today: continue to work on maintaining a reduced calorie state, getting the recommended amount of protein, incorporating whole foods, making healthy choices, staying well hydrated and practicing mindfulness when eating..  Additional resources provided today: None  Recommended Physical Activity Goals  Cindy Dixon has been advised to work up to 150 minutes of moderate intensity aerobic activity a week and strengthening exercises 2-3 times per week for cardiovascular health, weight loss maintenance and preservation of muscle mass.   She has agreed to :  Continue current level of physical activity   Pharmacotherapy We discussed various medication options to help Cindy Dixon with her weight loss efforts and we both agreed to : continue current anti-obesity medication regimen  ASSOCIATED CONDITIONS ADDRESSED TODAY  Polyphagia Assessment & Plan: Neurohormonal and psychological.  Under significant stress due to family dynamics.  She is no longer on SSRI because she was experiencing side effects.  Had been on topiramate without clinical response.  She has responded well to [facility] and will continue medication.     Class 3 severe obesity with serious comorbidity and body mass index (BMI) of 45.0 to 49.9 in adult, unspecified obesity type [name]) Assessment & Plan: Considering peak weight she has lost a total of 77 pounds, 30 of those after starting [facility] in July of this year.  She had only started losing muscle until recently still at a rate of less than 10% of total weight loss.  I  expressed concern about her weight loss rate.  She has also increased volume of physical activity but does not have the appetite or desire to increase caloric intake.  We may have to reduce medication if we see that she continues to lose muscle over the next 4 weeks.  She  is taking a multivitamin.  Orders: -     Semaglutide-Weight Management; Inject 1 mg into the skin once a week.  Dispense: 2 mL; Refill: 0  Pre-diabetes Assessment & Plan: Her most recent A1c is improved to 5.6.  She is currently on metformin 1000 mg twice daily and Wegovy 1 mg once a week without any adverse effects.  She will continue current medication for pharmacoprophylaxis and diabetes prevention.     Other orders -     multivitamin with minerals; Take 1 tablet by mouth daily.    PHYSICAL EXAM:  Blood pressure 114/79, pulse 66, temperature 98.2 F (36.8 C), height 5\' 8"  (1.727 m), weight 253 lb (114.8 kg), last menstrual period 12/14/2022, SpO2 98%. Body mass index is 38.47 kg/m.  General: She is overweight, cooperative, alert, well developed, and in no acute distress. PSYCH: Has normal mood, affect and thought process.   HEENT: EOMI, sclerae are anicteric. Lungs: Normal breathing effort, no conversational dyspnea. Extremities: No edema.  Neurologic: No gross sensory or motor deficits. No tremors or fasciculations noted.    DIAGNOSTIC DATA REVIEWED:  BMET    Component Value Date/Time   NA 139 03/28/2022 0829   K 4.6 03/28/2022 0829   CL 103 03/28/2022 0829   CO2 23 03/28/2022 0829   GLUCOSE 90 03/28/2022 0829   GLUCOSE 115 (H) 04/05/2020 1632   BUN 13 03/28/2022 0829   CREATININE 0.61 03/28/2022 0829   CALCIUM 9.2 03/28/2022 0829   GFRNONAA >60 04/05/2020 1632   GFRAA 137 10/11/2019 1040   Lab Results  Component Value Date   HGBA1C 5.6 06/12/2022   HGBA1C 5.7 (H) 11/12/2018   Lab Results  Component Value Date   INSULIN 20.5 07/17/2021   INSULIN 34.1 (H) 10/11/2019   Lab Results  Component Value Date   TSH 2.540 10/11/2019   CBC    Component Value Date/Time   WBC 8.8 03/28/2022 0829   WBC 10.9 (H) 04/05/2020 1632   RBC 4.93 03/28/2022 0829   RBC 4.61 04/05/2020 1632   HGB 13.5 03/28/2022 0829   HCT 41.2 03/28/2022 0829   PLT 319 03/28/2022 0829    MCV 84 03/28/2022 0829   MCH 27.4 03/28/2022 0829   MCH 28.2 04/05/2020 1632   MCHC 32.8 03/28/2022 0829   MCHC 33.2 04/05/2020 1632   RDW 13.4 03/28/2022 0829   Iron Studies No results found for: "IRON", "TIBC", "FERRITIN", "IRONPCTSAT" Lipid Panel     Component Value Date/Time   CHOL 172 10/11/2019 1040   TRIG 155 (H) 10/11/2019 1040   HDL 36 (L) 10/11/2019 1040   CHOLHDL 4.0 11/12/2018 1508   LDLCALC 108 (H) 10/11/2019 1040   Hepatic Function Panel     Component Value Date/Time   PROT 7.1 03/28/2022 0829   ALBUMIN 4.4 03/28/2022 0829   AST 16 03/28/2022 0829   ALT 19 03/28/2022 0829   ALKPHOS 73 03/28/2022 0829   BILITOT <0.2 03/28/2022 0829      Component Value Date/Time   TSH 2.540 10/11/2019 1040   Nutritional Lab Results  Component Value Date   VD25OH 29.2 (L) 08/12/2022   VD25OH 25.2 (L) 11/20/2021   VD25OH 28.4 (L) 07/17/2021  No follow-ups on file.Marland Kitchen She was informed of the importance of frequent follow up visits to maximize her success with intensive lifestyle modifications for her multiple health conditions.   ATTESTASTION STATEMENTS:  Reviewed by clinician on day of visit: allergies, medications, problem list, medical history, surgical history, family history, social history, and previous encounter notes.     Worthy Rancher, MD

## 2022-12-17 NOTE — Assessment & Plan Note (Signed)
Considering peak weight she has lost a total of 77 pounds, 30 of those after starting [facility] in July of this year.  She had only started losing muscle until recently still at a rate of less than 10% of total weight loss.  I expressed concern about her weight loss rate.  She has also increased volume of physical activity but does not have the appetite or desire to increase caloric intake.  We may have to reduce medication if we see that she continues to lose muscle over the next 4 weeks.  She is taking a multivitamin.

## 2022-12-17 NOTE — Assessment & Plan Note (Signed)
Her most recent A1c is improved to 5.6.  She is currently on metformin 1000 mg twice daily and Wegovy 1 mg once a week without any adverse effects.  She will continue current medication for pharmacoprophylaxis and diabetes prevention.

## 2022-12-19 ENCOUNTER — Other Ambulatory Visit (HOSPITAL_COMMUNITY): Payer: Self-pay

## 2023-01-13 ENCOUNTER — Other Ambulatory Visit (HOSPITAL_COMMUNITY): Payer: Self-pay

## 2023-01-13 ENCOUNTER — Encounter (INDEPENDENT_AMBULATORY_CARE_PROVIDER_SITE_OTHER): Payer: Self-pay | Admitting: Internal Medicine

## 2023-01-13 ENCOUNTER — Ambulatory Visit (INDEPENDENT_AMBULATORY_CARE_PROVIDER_SITE_OTHER): Payer: Medicaid Other | Admitting: Internal Medicine

## 2023-01-13 VITALS — BP 117/71 | HR 67 | Temp 98.2°F | Ht 68.0 in | Wt 249.0 lb

## 2023-01-13 DIAGNOSIS — R7303 Prediabetes: Secondary | ICD-10-CM

## 2023-01-13 DIAGNOSIS — R638 Other symptoms and signs concerning food and fluid intake: Secondary | ICD-10-CM | POA: Diagnosis not present

## 2023-01-13 DIAGNOSIS — Z6841 Body Mass Index (BMI) 40.0 and over, adult: Secondary | ICD-10-CM | POA: Diagnosis not present

## 2023-01-13 DIAGNOSIS — E66813 Obesity, class 3: Secondary | ICD-10-CM | POA: Diagnosis not present

## 2023-01-13 DIAGNOSIS — E559 Vitamin D deficiency, unspecified: Secondary | ICD-10-CM | POA: Diagnosis not present

## 2023-01-13 MED ORDER — SEMAGLUTIDE-WEIGHT MANAGEMENT 1 MG/0.5ML ~~LOC~~ SOAJ
1.0000 mg | SUBCUTANEOUS | 1 refills | Status: DC
  Filled 2023-01-13: qty 2, 28d supply, fill #0
  Filled 2023-02-18: qty 2, 28d supply, fill #1

## 2023-01-13 MED ORDER — VITAMIN D3 25 MCG (1000 UT) PO CAPS
1000.0000 [IU] | ORAL_CAPSULE | Freq: Every day | ORAL | Status: AC
Start: 2023-01-13 — End: ?

## 2023-01-13 NOTE — Assessment & Plan Note (Signed)
Improved on Wegovy.  She had increased orexigenic signaling, impaired satiety and inhibitory control. 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  In addition to nutritional and behavioral strategies she benefits from ongoing pharmacotherapy.  Unfortunately she is losing muscle at a higher than expected rate therefore we need to increase her calorie intake and we may need to reduce Wegovy if she has a hard time keeping up with nutritional requirements.

## 2023-01-13 NOTE — Progress Notes (Signed)
Office: [phone]  /  Fax: [phone]  Weight Summary And Biometrics  Vitals Temp: 98.2 F (36.8 C) BP: 117/71 Pulse Rate: 67 SpO2: 98 %   Anthropometric Measurements Height: 5\' 8"  (1.727 m) Weight: 249 lb (112.9 kg) BMI (Calculated): 37.87 Weight at Last Visit: 253 lb Weight Lost Since Last Visit: 4 lb Weight Gained Since Last Visit: 0 lb Starting Weight: 321 lb Total Weight Loss (lbs): 72 lb (32.7 kg)   Body Composition  Body Fat %: 41.3 % Fat Mass (lbs): 103 lbs Muscle Mass (lbs): 139.2 lbs Total Body Water (lbs): 93.2 lbs Visceral Fat Rating : 10    No data recorded Today's Visit #: 45  Starting Date: 10/11/19   Subjective   Chief Complaint: Obesity  Cindy Dixon is here to discuss her progress with her obesity treatment plan. She is on the the Category 3 Plan and states she is following her eating plan approximately 80 % of the time. She states she is exercising 40 minutes 5 times per week.  Interval History:   Since last office visit she has lost 4 pounds. She reports good adherence to reduced calorie nutritional plan. She has been working on reading food labels, not skipping meals, increasing protein intake at every meal, drinking more water, making healthier choices, reducing portion sizes, and incorporating more whole foods  Orexigenic Control:  Denies problems with appetite and hunger signals.  Denies problems with satiety and satiation.  Denies problems with eating patterns and portion control.  Denies abnormal cravings. Denies feeling deprived or restricted.   Barriers identified: none.   Pharmacotherapy for weight loss: She is currently taking Wegovy with adequate clinical response  and without side effects..   Assessment and Plan   Treatment Plan For Obesity:  Recommended Dietary Goals  Cindy Dixon is currently in the action stage of change. As such, her goal is to continue weight management plan. She has agreed to: continue  current plan  Behavioral Intervention  We discussed the following Behavioral Modification Strategies today: continue to work on maintaining a reduced calorie state, getting the recommended amount of protein, incorporating whole foods, making healthy choices, staying well hydrated and practicing mindfulness when eating..  Additional resources provided today: None  Recommended Physical Activity Goals  Cindy Dixon has been advised to work up to 150 minutes of moderate intensity aerobic activity a week and strengthening exercises 2-3 times per week for cardiovascular health, weight loss maintenance and preservation of muscle mass.   She has agreed to :  Continue current level of physical activity   Pharmacotherapy  We discussed various medication options to help Cindy Dixon with her weight loss efforts and we both agreed to : Patient continues  Associated Conditions Addressed Today  Prediabetes Assessment & Plan: Her most recent A1c is improved to 5.6.  She is currently on metformin 1000 mg twice daily and Wegovy 1 mg once a week without any adverse effects.  We are discontinuing metformin for now she will continue on Wegovy for pharmacoprophylaxis and weight management   Class 3 severe obesity with serious comorbidity and body mass index (BMI) of 45.0 to 49.9 in adult, unspecified obesity type [facility]) Assessment & Plan: Peak Weight: 321 Starting Weight : 321  Current Weight: 249 Working BMR: 2121 Weight loss goal: 15% Nutritional: 1500 KCAL target Past Pharmacotherapy: None Current Pharmacotherapy: Wegovy since July 2024  Patient continues to lose muscle mass, we are discontinuing metformin because of enhanced effect.  I also counseled her on tracking and journaling calories  I would like for her to consume about 1600 cal/day, 100 g of protein.  If she has problems with getting the right amount of nutrition that we may have to reduce her Wegovy at the next visit.  Orders: -      Semaglutide-Weight Management; Inject 1 mg into the skin once a week.  Dispense: 2 mL; Refill: 1  Vitamin D deficiency Assessment & Plan: Discontinue high-dose vitamin D supplementation.  Start vitamin D3 1000 international units daily.  She is also taking a multivitamin daily  Orders: -     Vitamin D3; Take 1 capsule (1,000 Units total) by mouth daily.  Abnormal food appetite Assessment & Plan: Improved on Wegovy.  She had increased orexigenic signaling, impaired satiety and inhibitory control. 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  In addition to nutritional and behavioral strategies she benefits from ongoing pharmacotherapy.  Unfortunately she is losing muscle at a higher than expected rate therefore we need to increase her calorie intake and we may need to reduce Wegovy if she has a hard time keeping up with nutritional requirements.      Objective   Physical Exam:  Blood pressure 117/71, pulse 67, temperature 98.2 F (36.8 C), height 5\' 8"  (1.727 m), weight 249 lb (112.9 kg), last menstrual period 12/14/2022, SpO2 98%. Body mass index is 37.86 kg/m.  General: She is overweight, cooperative, alert, well developed, and in no acute distress. PSYCH: Has normal mood, affect and thought process.   HEENT: EOMI, sclerae are anicteric. Lungs: Normal breathing effort, no conversational dyspnea. Extremities: No edema.  Neurologic: No gross sensory or motor deficits. No tremors or fasciculations noted.    Diagnostic Data Reviewed:  BMET    Component Value Date/Time   NA 139 03/28/2022 0829   K 4.6 03/28/2022 0829   CL 103 03/28/2022 0829   CO2 23 03/28/2022 0829   GLUCOSE 90 03/28/2022 0829   GLUCOSE 115 (H) 04/05/2020 1632   BUN 13 03/28/2022 0829   CREATININE 0.61 03/28/2022 0829   CALCIUM 9.2 03/28/2022 0829   GFRNONAA >60 04/05/2020 1632   GFRAA 137 10/11/2019 1040   Lab  Results  Component Value Date   HGBA1C 5.6 06/12/2022   HGBA1C 5.7 (H) 11/12/2018   Lab Results  Component Value Date   INSULIN 20.5 07/17/2021   INSULIN 34.1 (H) 10/11/2019   Lab Results  Component Value Date   TSH 2.540 10/11/2019   CBC    Component Value Date/Time   WBC 8.8 03/28/2022 0829   WBC 10.9 (H) 04/05/2020 1632   RBC 4.93 03/28/2022 0829   RBC 4.61 04/05/2020 1632   HGB 13.5 03/28/2022 0829   HCT 41.2 03/28/2022 0829   PLT 319 03/28/2022 0829   MCV 84 03/28/2022 0829   MCH 27.4 03/28/2022 0829   MCH 28.2 04/05/2020 1632   MCHC 32.8 03/28/2022 0829   MCHC 33.2 04/05/2020 1632   RDW 13.4 03/28/2022 0829   Iron Studies No results found for: "IRON", "TIBC", "FERRITIN", "IRONPCTSAT" Lipid Panel     Component Value Date/Time   CHOL 172 10/11/2019 1040   TRIG 155 (H) 10/11/2019 1040   HDL 36 (L) 10/11/2019 1040   CHOLHDL 4.0 11/12/2018 1508   LDLCALC 108 (H) 10/11/2019 1040   Hepatic Function Panel     Component Value Date/Time   PROT 7.1 03/28/2022 0829   ALBUMIN 4.4 03/28/2022 0829   AST 16 03/28/2022 0829  ALT 19 03/28/2022 0829   ALKPHOS 73 03/28/2022 0829   BILITOT <0.2 03/28/2022 0829      Component Value Date/Time   TSH 2.540 10/11/2019 1040   Nutritional Lab Results  Component Value Date   VD25OH 29.2 (L) 08/12/2022   VD25OH 25.2 (L) 11/20/2021   VD25OH 28.4 (L) 07/17/2021    Follow-Up   Return in about 4 weeks (around 02/10/2023) for For Weight Mangement with Dr. Rikki Spearing.Marland Kitchen She was informed of the importance of frequent follow up visits to maximize her success with intensive lifestyle modifications for her multiple health conditions.  Attestation Statement   Reviewed by clinician on day of visit: allergies, medications, problem list, medical history, surgical history, family history, social history, and previous encounter notes.     Worthy Rancher, MD

## 2023-01-13 NOTE — Assessment & Plan Note (Signed)
Her most recent A1c is improved to 5.6.  She is currently on metformin 1000 mg twice daily and Wegovy 1 mg once a week without any adverse effects.  We are discontinuing metformin for now she will continue on Wegovy for pharmacoprophylaxis and weight management

## 2023-01-13 NOTE — Assessment & Plan Note (Signed)
Peak Weight: 321 Starting Weight : 321  Current Weight: 249 Working BMR: 2121 Weight loss goal: 15% Nutritional: 1500 KCAL target Past Pharmacotherapy: None Current Pharmacotherapy: Wegovy since July 2024  Patient continues to lose muscle mass, we are discontinuing metformin because of enhanced effect.  I also counseled her on tracking and journaling calories I would like for her to consume about 1600 cal/day, 100 g of protein.  If she has problems with getting the right amount of nutrition that we may have to reduce her Wegovy at the next visit.

## 2023-01-13 NOTE — Assessment & Plan Note (Signed)
Discontinue high-dose vitamin D supplementation.  Start vitamin D3 1000 international units daily.  She is also taking a multivitamin daily

## 2023-01-17 ENCOUNTER — Other Ambulatory Visit (HOSPITAL_COMMUNITY): Payer: Self-pay

## 2023-01-25 ENCOUNTER — Other Ambulatory Visit (INDEPENDENT_AMBULATORY_CARE_PROVIDER_SITE_OTHER): Payer: Self-pay | Admitting: Internal Medicine

## 2023-01-25 DIAGNOSIS — E559 Vitamin D deficiency, unspecified: Secondary | ICD-10-CM

## 2023-02-18 ENCOUNTER — Other Ambulatory Visit (HOSPITAL_COMMUNITY): Payer: Self-pay

## 2023-02-19 ENCOUNTER — Encounter (INDEPENDENT_AMBULATORY_CARE_PROVIDER_SITE_OTHER): Payer: Self-pay | Admitting: Internal Medicine

## 2023-02-19 ENCOUNTER — Ambulatory Visit (INDEPENDENT_AMBULATORY_CARE_PROVIDER_SITE_OTHER): Payer: Medicaid Other | Admitting: Internal Medicine

## 2023-02-19 ENCOUNTER — Other Ambulatory Visit (HOSPITAL_COMMUNITY): Payer: Self-pay

## 2023-02-19 VITALS — BP 112/72 | HR 77 | Temp 98.1°F | Ht 68.0 in | Wt 242.0 lb

## 2023-02-19 DIAGNOSIS — E66813 Obesity, class 3: Secondary | ICD-10-CM

## 2023-02-19 DIAGNOSIS — Z6841 Body Mass Index (BMI) 40.0 and over, adult: Secondary | ICD-10-CM

## 2023-02-19 DIAGNOSIS — E559 Vitamin D deficiency, unspecified: Secondary | ICD-10-CM

## 2023-02-19 DIAGNOSIS — R638 Other symptoms and signs concerning food and fluid intake: Secondary | ICD-10-CM

## 2023-02-19 DIAGNOSIS — R7303 Prediabetes: Secondary | ICD-10-CM | POA: Diagnosis not present

## 2023-02-19 MED ORDER — SEMAGLUTIDE-WEIGHT MANAGEMENT 1 MG/0.5ML ~~LOC~~ SOAJ
1.0000 mg | SUBCUTANEOUS | 1 refills | Status: DC
  Filled 2023-02-19: qty 2, 28d supply, fill #0

## 2023-02-19 NOTE — Progress Notes (Addendum)
 Office: [phone]  /  Fax: [phone]  Weight Summary And Biometrics  Vitals Temp: 98.1 F (36.7 C) BP: 112/72 Pulse Rate: 77 SpO2: 100 %   Anthropometric Measurements Height: 5' 8 (1.727 m) Weight: 242 lb (109.8 kg) BMI (Calculated): 36.8 Weight at Last Visit: 249 lb Weight Lost Since Last Visit: 6 lb Weight Gained Since Last Visit: 0 lb Starting Weight: 321 lb Total Weight Loss (lbs): 78 lb (35.4 kg)   Body Composition  Body Fat %: 45.2 % Fat Mass (lbs): 109.6 lbs Muscle Mass (lbs): 126.2 lbs Total Body Water (lbs): 89 lbs Visceral Fat Rating : 10    No data recorded No data recorded No data recorded  Subjective   Chief Complaint: Obesity  Nargis is here to discuss her progress with her obesity treatment plan. She is on the the Category 3 Plan and states she is following her eating plan approximately 65 % of the time. She states she is exercising 50 minutes 5 times per week.  Interval History:   Since last office visit she has lost 6 pounds.  BIA shows further reductions in muscle mass which we are trying to avoid She reports good adherence to reduced calorie nutritional plan. She has been working on reading food labels, not skipping meals, increasing protein intake at every meal, drinking more water, making healthier choices, reducing portion sizes, and incorporating more whole foods   Sleeping approximately 7 hours a day.  Sleep described as restorative.   Stress levels are reported as low and manageable.   Orexigenic Control:  Denies problems with appetite and hunger signals.  Denies problems with satiety and satiation.  Denies problems with eating patterns and portion control.  Denies abnormal cravings. Denies feeling deprived or restricted.   Barriers identified:  Rapid weight loss and loss of muscle mass .   Pharmacotherapy for weight loss: She is currently taking Wegovy  with adequate clinical response  and experiencing the  following side effects: Loss of muscle mass..   Assessment and Plan   Treatment Plan For Obesity:  Recommended Dietary Goals  Estela is currently in the action stage of change. As such, her goal is to continue weight management plan. She has agreed to:  Patient again advised to increase caloric intake to 1600 kcal/day and about 120 g of protein  Behavioral Intervention  We discussed the following Behavioral Modification Strategies today: increasing lean protein intake to established goals, avoiding skipping meals, and avoidance of reduction of portions too much and increasing calories.  GLP-1 medication may have to be reduced .  Additional resources provided today:  Educated on different sources of protein  Recommended Physical Activity Goals  Wynee has been advised to work up to 150 minutes of moderate intensity aerobic activity a week and strengthening exercises 2-3 times per week for cardiovascular health, weight loss maintenance and preservation of muscle mass.   She has agreed to :  Continue current level of physical activity   Pharmacotherapy  We discussed various medication options to help Tensley with her weight loss efforts and we both agreed to : continue current anti-obesity medication regimen and consider decrease in Wegovy  at the next visit if no progress is made pertaining to her muscle mass.  Associated Conditions Addressed and Impacted by Obesity Treatment  Prediabetes Assessment & Plan: Her most recent A1c is improved to 5.6.  She had been on metformin  1000 mg twice daily and Wegovy  1 mg once a week without any adverse effects.  We are  discontinuing metformin  for now she will continue on Wegovy  for pharmacoprophylaxis and weight management.  She has been counseled on the carb insulin  model of obesity and is aware of the importance of maintaining a low glycemic load.   Class 3 severe obesity with serious comorbidity and body mass index (BMI) of 45.0 to  49.9 in adult, unspecified obesity type [facility]) Assessment & Plan: Peak Weight: 321 Starting Weight : 321  Current Weight: 242 Working BMR: 2121 Weight loss goal: 15% by June 2025 Nutritional: 1500 KCAL target Past Pharmacotherapy: None Current Pharmacotherapy: Wegovy  since July 2024  Reviewed BIA information suggests that she started to lose muscle at a fast rate after initiation with Wegovy  back in June 2024.  She is currently at 1 mg dose with adequate orexigenic control.  We discussed the importance of increasing caloric intake to 1600 cal/day and protein to 120 g/day.  If she has problems with getting the right amount of nutrition that we we will reduce her Wegovy  at the next visit.  She is taking a multivitamin with minerals  Orders: -     Semaglutide -Weight Management; Inject 1 mg into the skin once a week.  Dispense: 2 mL; Refill: 1  Vitamin D  deficiency Assessment & Plan: Discontinue high-dose vitamin D  supplementation.  Start vitamin D3 1000 international units daily.  She is also taking a multivitamin daily   Abnormal food appetite Assessment & Plan: Improved on Wegovy .  She had increased orexigenic signaling, impaired satiety and inhibitory control. 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  In addition to nutritional and behavioral strategies she benefits from ongoing pharmacotherapy.  We may have to reduce GLP-1 therapy due to muscle loss.       Objective   Physical Exam:  Blood pressure 112/72, pulse 77, temperature 98.1 F (36.7 C), height 5' 8 (1.727 m), weight 242 lb (109.8 kg), SpO2 100%. Body mass index is 36.8 kg/m.  General: She is overweight, cooperative, alert, well developed, and in no acute distress. PSYCH: Has normal mood, affect and thought process.   HEENT: EOMI, sclerae are anicteric. Lungs:  Normal breathing effort, no conversational dyspnea. Extremities: No edema.  Neurologic: No gross sensory or motor deficits. No tremors or fasciculations noted.    Diagnostic Data Reviewed:  BMET    Component Value Date/Time   NA 139 03/28/2022 0829   K 4.6 03/28/2022 0829   CL 103 03/28/2022 0829   CO2 23 03/28/2022 0829   GLUCOSE 90 03/28/2022 0829   GLUCOSE 115 (H) 04/05/2020 1632   BUN 13 03/28/2022 0829   CREATININE 0.61 03/28/2022 0829   CALCIUM 9.2 03/28/2022 0829   GFRNONAA >60 04/05/2020 1632   GFRAA 137 10/11/2019 1040   Lab Results  Component Value Date   HGBA1C 5.6 06/12/2022   HGBA1C 5.7 (H) 11/12/2018   Lab Results  Component Value Date   INSULIN  20.5 07/17/2021   INSULIN  34.1 (H) 10/11/2019   Lab Results  Component Value Date   TSH 2.540 10/11/2019   CBC    Component Value Date/Time   WBC 8.8 03/28/2022 0829   WBC 10.9 (H) 04/05/2020 1632   RBC 4.93 03/28/2022 0829   RBC 4.61 04/05/2020 1632   HGB 13.5 03/28/2022 0829   HCT 41.2 03/28/2022 0829   PLT 319 03/28/2022 0829   MCV 84 03/28/2022 0829   MCH 27.4 03/28/2022 0829  MCH 28.2 04/05/2020 1632   MCHC 32.8 03/28/2022 0829   MCHC 33.2 04/05/2020 1632   RDW 13.4 03/28/2022 0829   Iron Studies No results found for: IRON, TIBC, FERRITIN, IRONPCTSAT Lipid Panel     Component Value Date/Time   CHOL 172 10/11/2019 1040   TRIG 155 (H) 10/11/2019 1040   HDL 36 (L) 10/11/2019 1040   CHOLHDL 4.0 11/12/2018 1508   LDLCALC 108 (H) 10/11/2019 1040   Hepatic Function Panel     Component Value Date/Time   PROT 7.1 03/28/2022 0829   ALBUMIN 4.4 03/28/2022 0829   AST 16 03/28/2022 0829   ALT 19 03/28/2022 0829   ALKPHOS 73 03/28/2022 0829   BILITOT <0.2 03/28/2022 0829      Component Value Date/Time   TSH 2.540 10/11/2019 1040   Nutritional Lab Results  Component Value Date   VD25OH 29.2 (L) 08/12/2022   VD25OH 25.2 (L) 11/20/2021   VD25OH 28.4 (L) 07/17/2021    Follow-Up    Return in about 4 weeks (around 03/19/2023) for For Weight Mangement with Dr. Francyne.SABRA She was informed of the importance of frequent follow up visits to maximize her success with intensive lifestyle modifications for her multiple health conditions.  Attestation Statement   Reviewed by clinician on day of visit: allergies, medications, problem list, medical history, surgical history, family history, social history, and previous encounter notes.     Lucas Francyne, MD

## 2023-02-19 NOTE — Assessment & Plan Note (Addendum)
 Improved on Wegovy .  She had increased orexigenic signaling, impaired satiety and inhibitory control. 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  In addition to nutritional and behavioral strategies she benefits from ongoing pharmacotherapy.  We may have to reduce GLP-1 therapy due to muscle loss.

## 2023-02-19 NOTE — Assessment & Plan Note (Signed)
 Discontinue high-dose vitamin D supplementation.  Start vitamin D3 1000 international units daily.  She is also taking a multivitamin daily

## 2023-02-19 NOTE — Assessment & Plan Note (Addendum)
 Peak Weight: 321 Starting Weight : 321  Current Weight: 242 Working BMR: 2121 Weight loss goal: 15% by June 2025 Nutritional: 1500 KCAL target Past Pharmacotherapy: None Current Pharmacotherapy: Wegovy  since July 2024  Reviewed BIA information suggests that she started to lose muscle at a fast rate after initiation with Wegovy  back in June 2024.  She is currently at 1 mg dose with adequate orexigenic control.  We discussed the importance of increasing caloric intake to 1600 cal/day and protein to 120 g/day.  If she has problems with getting the right amount of nutrition that we we will reduce her Wegovy  at the next visit.  She is taking a multivitamin with minerals

## 2023-02-19 NOTE — Assessment & Plan Note (Signed)
 Her most recent A1c is improved to 5.6.  She had been on metformin  1000 mg twice daily and Wegovy  1 mg once a week without any adverse effects.  We are discontinuing metformin  for now she will continue on Wegovy  for pharmacoprophylaxis and weight management.  She has been counseled on the carb insulin  model of obesity and is aware of the importance of maintaining a low glycemic load.

## 2023-03-19 ENCOUNTER — Other Ambulatory Visit (HOSPITAL_COMMUNITY): Payer: Self-pay

## 2023-03-19 ENCOUNTER — Encounter (INDEPENDENT_AMBULATORY_CARE_PROVIDER_SITE_OTHER): Payer: Self-pay | Admitting: Internal Medicine

## 2023-03-19 ENCOUNTER — Ambulatory Visit (INDEPENDENT_AMBULATORY_CARE_PROVIDER_SITE_OTHER): Payer: Medicaid Other | Admitting: Internal Medicine

## 2023-03-19 VITALS — BP 121/81 | HR 73 | Temp 98.2°F | Ht 68.0 in | Wt 243.0 lb

## 2023-03-19 DIAGNOSIS — Z6841 Body Mass Index (BMI) 40.0 and over, adult: Secondary | ICD-10-CM | POA: Diagnosis not present

## 2023-03-19 DIAGNOSIS — R7303 Prediabetes: Secondary | ICD-10-CM | POA: Diagnosis not present

## 2023-03-19 DIAGNOSIS — E66813 Obesity, class 3: Secondary | ICD-10-CM | POA: Diagnosis not present

## 2023-03-19 DIAGNOSIS — R638 Other symptoms and signs concerning food and fluid intake: Secondary | ICD-10-CM | POA: Diagnosis not present

## 2023-03-19 MED ORDER — WEGOVY 1.7 MG/0.75ML ~~LOC~~ SOAJ
1.7000 mg | SUBCUTANEOUS | 0 refills | Status: DC
  Filled 2023-03-19 – 2023-03-25 (×3): qty 3, 28d supply, fill #0

## 2023-03-19 NOTE — Assessment & Plan Note (Addendum)
 Her most recent A1c is improved to 5.6.  She had been on metformin  1000 mg twice daily and Wegovy  1 mg once a week without any adverse effects.  She is now off metformin .  We may consider adding metformin  for synergy if needed in the future.  We are increasing Wegovy  to 1.7 mg once a week.  Were checking disease monitoring labs in a fasting state at the next office visit.  We will also check fasting lipid profile.

## 2023-03-19 NOTE — Assessment & Plan Note (Signed)
 Peak Weight: 321 Starting Weight : 321  Current Weight: 243 Working BMR: 2121 Weight loss goal: 200 Nutritional: 1500 KCAL target Past Pharmacotherapy: Metformin , SSRI Current Pharmacotherapy: Wegovy  since July 2024  Cindy Dixon has lost a total of 78 pounds or 25% of total body weight on medically supervised plan inclusive of GLP-1 therapy.  She increase her calories recently as well as protein intake, and information shows further reductions in body fat, visceral fat rating and gains in muscle mass.  This was of concern previously that she was losing muscle at a faster than expected rate likely due to restriction of calories and inadequate protein intake.  She has turned this around.  She also continues to do a combination of aerobic and strengthening exercises.  She is noticing a waning effect on the 1 mg of GLP-1 we will therefore increase Wegovy  to 1.7 mg once a week.  We reviewed medication safety as well as nutritional strategies to avoid side effects.

## 2023-03-19 NOTE — Progress Notes (Signed)
 Office: [phone]  /  Fax: [phone]  Weight Summary And Biometrics  Vitals Temp: 98.2 F (36.8 C) BP: 121/81 Pulse Rate: 73 SpO2: 99 %   Anthropometric Measurements Height: 5' 8 (1.727 m) Weight: 243 lb (110.2 kg) BMI (Calculated): 36.96 Weight at Last Visit: 249 lb Weight Lost Since Last Visit: 0 lb Weight Gained Since Last Visit: 1 lb Starting Weight: 321 lb Total Weight Loss (lbs): 77 lb (34.9 kg)   Body Composition  Body Fat %: 40.5 % Fat Mass (lbs): 98.6 lbs Muscle Mass (lbs): 137.8 lbs Total Body Water (lbs): 92.4 lbs Visceral Fat Rating : 9    No data recorded Today's Visit #: 39  Starting Date: 10/11/19   Subjective   Chief Complaint: Obesity  Cindy Dixon is here to discuss her progress with her obesity treatment plan. She is on the the Category 3 Plan and states she is following her eating plan approximately 80 % of the time. She states she is exercising 30 minutes 5 times per week.  Weight Progress Since Last Visit:  Since last office visit she has gained 1 pounds.  BIA information suggest further reductions in body fat gain and muscle mass and improvement in visceral fat.  We had advised patient to increase her calories at the last office visit. She reports good adherence to reduced calorie nutritional plan. She has been working on reading food labels, not skipping meals, increasing protein intake at every meal, drinking more water, making healthier choices, continues to exercise, reducing portion sizes, and incorporating more whole foods   Challenges affecting patient progress: none.   Orexigenic Control: Denies problems with appetite and hunger signals.  Denies problems with satiety and satiation.  Denies problems with eating patterns and portion control.  Denies abnormal cravings. Denies feeling deprived or restricted.   Pharmacotherapy for weight management: She is currently taking Wegovy  with adequate clinical response  and without  side effects..   Assessment and Plan   Treatment Plan For Obesity:  Recommended Dietary Goals  [name] is currently in the action stage of change. As such, her goal is to continue weight management plan. She has agreed to: continue current plan  Behavioral Health and Counseling  We discussed the following behavioral modification strategies today: continue to work on maintaining a reduced calorie state, getting the recommended amount of protein, incorporating whole foods, making healthy choices, staying well hydrated and practicing mindfulness when eating..  Additional education and resources provided today: None  Recommended Physical Activity Goals  [name] has been advised to work up to 150 minutes of moderate intensity aerobic activity a week and strengthening exercises 2-3 times per week for cardiovascular health, weight loss maintenance and preservation of muscle mass.   She has agreed to :  Think about enjoyable ways to increase daily physical activity and overcoming barriers to exercise and Increase physical activity in their day and reduce sedentary time (increase NEAT).  Pharmacotherapy  We discussed various medication options to help Jen with her weight loss efforts and we both agreed to : increase Wegovy  to 1.7 mg once week  Associated Conditions Impacted by Obesity Treatment  Prediabetes Assessment & Plan: Her most recent A1c is improved to 5.6.  She had been on metformin  1000 mg twice daily and Wegovy  1 mg once a week without any adverse effects.  She is now off metformin .  We may consider adding metformin  for synergy if needed in the future.  We are increasing Wegovy  to 1.7 mg once a week.  Were checking disease monitoring labs in a fasting state at the next office visit.  We will also check fasting lipid profile.  Orders: -     Wegovy ; Inject 1.7 mg into the skin once a week.  Dispense: 3 mL; Refill: 0  Class 3 severe obesity with serious comorbidity  and body mass index (BMI) of 45.0 to 49.9 in adult, unspecified obesity type [facility]) Assessment & Plan: Peak Weight: 321 Starting Weight : 321  Current Weight: 243 Working BMR: 2121 Weight loss goal: 200 Nutritional: 1500 KCAL target Past Pharmacotherapy: Metformin , SSRI Current Pharmacotherapy: Wegovy  since July 2024  [name] has lost a total of 78 pounds or 25% of total body weight on medically supervised plan inclusive of GLP-1 therapy.  She increase her calories recently as well as protein intake, and information shows further reductions in body fat, visceral fat rating and gains in muscle mass.  This was of concern previously that she was losing muscle at a faster than expected rate likely due to restriction of calories and inadequate protein intake.  She has turned this around.  She also continues to do a combination of aerobic and strengthening exercises.  She is noticing a waning effect on the 1 mg of GLP-1 we will therefore increase Wegovy  to 1.7 mg once a week.  We reviewed medication safety as well as nutritional strategies to avoid side effects.    Orders: -     Wegovy ; Inject 1.7 mg into the skin once a week.  Dispense: 3 mL; Refill: 0  Abnormal food appetite Assessment & Plan: Improved on Wegovy .  She had increased orexigenic signaling, impaired satiety and inhibitory control. 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  In addition to nutritional and behavioral strategies she benefits from ongoing pharmacotherapy.  We will increase Wegovy  to 1.7 mg once a week   Orders: -     Wegovy ; Inject 1.7 mg into the skin once a week.  Dispense: 3 mL; Refill: 0     Objective   Physical Exam:  Blood pressure 121/81, pulse 73, temperature 98.2 F (36.8 C), height 5' 8 (1.727 m), weight 243 lb (110.2 kg), last menstrual period  02/21/2023, SpO2 99%. Body mass index is 36.95 kg/m.  General: She is overweight, cooperative, alert, well developed, and in no acute distress. PSYCH: Has normal mood, affect and thought process.   HEENT: EOMI, sclerae are anicteric. Lungs: Normal breathing effort, no conversational dyspnea. Extremities: No edema.  Neurologic: No gross sensory or motor deficits. No tremors or fasciculations noted.    Diagnostic Data Reviewed:  BMET    Component Value Date/Time   NA 139 03/28/2022 0829   K 4.6 03/28/2022 0829   CL 103 03/28/2022 0829   CO2 23 03/28/2022 0829   GLUCOSE 90 03/28/2022 0829   GLUCOSE 115 (H) 04/05/2020 1632   BUN 13 03/28/2022 0829   CREATININE 0.61 03/28/2022 0829   CALCIUM 9.2 03/28/2022 0829   GFRNONAA >60 04/05/2020 1632   GFRAA 137 10/11/2019 1040   Lab Results  Component Value Date   HGBA1C 5.6 06/12/2022   HGBA1C 5.7 (H) 11/12/2018   Lab Results  Component Value Date   INSULIN  20.5 07/17/2021   INSULIN  34.1 (H) 10/11/2019   Lab Results  Component Value Date   TSH 2.540 10/11/2019   CBC    Component Value Date/Time   WBC 8.8  03/28/2022 0829   WBC 10.9 (H) 04/05/2020 1632   RBC 4.93 03/28/2022 0829   RBC 4.61 04/05/2020 1632   HGB 13.5 03/28/2022 0829   HCT 41.2 03/28/2022 0829   PLT 319 03/28/2022 0829   MCV 84 03/28/2022 0829   MCH 27.4 03/28/2022 0829   MCH 28.2 04/05/2020 1632   MCHC 32.8 03/28/2022 0829   MCHC 33.2 04/05/2020 1632   RDW 13.4 03/28/2022 0829   Iron Studies No results found for: IRON, TIBC, FERRITIN, IRONPCTSAT Lipid Panel     Component Value Date/Time   CHOL 172 10/11/2019 1040   TRIG 155 (H) 10/11/2019 1040   HDL 36 (L) 10/11/2019 1040   CHOLHDL 4.0 11/12/2018 1508   LDLCALC 108 (H) 10/11/2019 1040   Hepatic Function Panel     Component Value Date/Time   PROT 7.1 03/28/2022 0829   ALBUMIN 4.4 03/28/2022 0829   AST 16 03/28/2022 0829   ALT 19 03/28/2022 0829   ALKPHOS 73 03/28/2022 0829    BILITOT <0.2 03/28/2022 0829      Component Value Date/Time   TSH 2.540 10/11/2019 1040   Nutritional Lab Results  Component Value Date   VD25OH 29.2 (L) 08/12/2022   VD25OH 25.2 (L) 11/20/2021   VD25OH 28.4 (L) 07/17/2021    Follow-Up   Return in about 4 weeks (around 04/16/2023) for Fasting for labs, For Weight Mangement with Dr. [email or web address].SABRA She was informed of the importance of frequent follow up visits to maximize her success with intensive lifestyle modifications for her multiple health conditions.  Attestation Statement   Reviewed by clinician on day of visit: allergies, medications, problem list, medical history, surgical history, family history, social history, and previous encounter notes.     Lucas Francyne, MD

## 2023-03-19 NOTE — Assessment & Plan Note (Signed)
 Improved on Wegovy .  She had increased orexigenic signaling, impaired satiety and inhibitory control. 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  In addition to nutritional and behavioral strategies she benefits from ongoing pharmacotherapy.  We will increase Wegovy  to 1.7 mg once a week

## 2023-03-20 ENCOUNTER — Encounter (HOSPITAL_COMMUNITY): Payer: Self-pay

## 2023-03-20 ENCOUNTER — Telehealth (INDEPENDENT_AMBULATORY_CARE_PROVIDER_SITE_OTHER): Payer: Self-pay | Admitting: Internal Medicine

## 2023-03-20 ENCOUNTER — Encounter (INDEPENDENT_AMBULATORY_CARE_PROVIDER_SITE_OTHER): Payer: Self-pay

## 2023-03-20 ENCOUNTER — Other Ambulatory Visit (HOSPITAL_COMMUNITY): Payer: Self-pay

## 2023-03-20 NOTE — Telephone Encounter (Signed)
 Good morning!   Patient called because the pharmacy did not have a prescription for her Wegovy  and is asking for it to be resubmitted.   Thank you!

## 2023-03-20 NOTE — Telephone Encounter (Signed)
 Rx was sent on 2/5, sent pt message and asked if the medication is not in stock

## 2023-03-24 ENCOUNTER — Encounter (HOSPITAL_COMMUNITY): Payer: Self-pay

## 2023-03-24 ENCOUNTER — Other Ambulatory Visit (HOSPITAL_COMMUNITY): Payer: Self-pay

## 2023-03-24 ENCOUNTER — Telehealth (INDEPENDENT_AMBULATORY_CARE_PROVIDER_SITE_OTHER): Payer: Self-pay

## 2023-03-24 NOTE — Telephone Encounter (Signed)
 PA Wegovy  1.7 started

## 2023-03-25 ENCOUNTER — Other Ambulatory Visit (HOSPITAL_COMMUNITY): Payer: Self-pay

## 2023-03-25 ENCOUNTER — Encounter (HOSPITAL_COMMUNITY): Payer: Self-pay

## 2023-03-25 NOTE — Telephone Encounter (Signed)
PA approved, pt notified

## 2023-03-26 ENCOUNTER — Other Ambulatory Visit (HOSPITAL_COMMUNITY): Payer: Self-pay

## 2023-04-16 ENCOUNTER — Encounter (INDEPENDENT_AMBULATORY_CARE_PROVIDER_SITE_OTHER): Payer: Self-pay | Admitting: Internal Medicine

## 2023-04-16 ENCOUNTER — Ambulatory Visit (INDEPENDENT_AMBULATORY_CARE_PROVIDER_SITE_OTHER): Payer: Medicaid Other | Admitting: Internal Medicine

## 2023-04-16 ENCOUNTER — Other Ambulatory Visit (HOSPITAL_COMMUNITY): Payer: Self-pay

## 2023-04-16 VITALS — BP 109/76 | HR 63 | Temp 97.9°F | Ht 68.0 in | Wt 237.0 lb

## 2023-04-16 DIAGNOSIS — E559 Vitamin D deficiency, unspecified: Secondary | ICD-10-CM

## 2023-04-16 DIAGNOSIS — R638 Other symptoms and signs concerning food and fluid intake: Secondary | ICD-10-CM | POA: Diagnosis not present

## 2023-04-16 DIAGNOSIS — R7303 Prediabetes: Secondary | ICD-10-CM

## 2023-04-16 DIAGNOSIS — E66813 Obesity, class 3: Secondary | ICD-10-CM | POA: Diagnosis not present

## 2023-04-16 DIAGNOSIS — Z6841 Body Mass Index (BMI) 40.0 and over, adult: Secondary | ICD-10-CM

## 2023-04-16 MED ORDER — WEGOVY 1.7 MG/0.75ML ~~LOC~~ SOAJ
1.7000 mg | SUBCUTANEOUS | 0 refills | Status: DC
  Filled 2023-04-16 – 2023-04-21 (×2): qty 3, 28d supply, fill #0

## 2023-04-16 NOTE — Assessment & Plan Note (Signed)
 Cindy Dixon has lost 33 pounds or 27% of total body weight on medically supervised weight management plan inclusive of GLP-1 therapy.  She is under good job preserving muscle mass and adherence to a balanced nutrition.  She is also getting adequate sleep and maintaining adequate hydration.  She is on Wegovy 1.7 without any adverse effects and will continue medication.

## 2023-04-16 NOTE — Assessment & Plan Note (Signed)
 Improved significantly on Wegovy.  She had increased orexigenic signaling, impaired satiety and inhibitory control. 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  She also has insulin resistance and may have leptin resistance.  In addition to nutritional and behavioral strategies she benefits from ongoing pharmacotherapy.  She will continue Wegovy to 1.7 mg once a week

## 2023-04-16 NOTE — Assessment & Plan Note (Signed)
 She is on vitamin D supplementation.  We will check vitamin D levels as she is at risk for vitamin D deficiency secondary to obesity.

## 2023-04-16 NOTE — Progress Notes (Signed)
 Office: [phone]  /  Fax: [phone]  Weight Summary And Biometrics  Vitals Temp: 97.9 F (36.6 C) BP: 109/76 Pulse Rate: 63 SpO2: 99 %   Anthropometric Measurements Height: 5\' 8"  (1.727 m) Weight: 237 lb (107.5 kg) BMI (Calculated): 36.04 Weight at Last Visit: 243 lb Weight Lost Since Last Visit: 6 lb Weight Gained Since Last Visit: 0 lb Starting Weight: 321 lb Total Weight Loss (lbs): 83 lb (37.6 kg)   Body Composition  Body Fat %: 39.7 % Fat Mass (lbs): 94.4 lbs Muscle Mass (lbs): 136 lbs Total Body Water (lbs): 89.8 lbs Visceral Fat Rating : 9    No data recorded Today's Visit #: 48  Starting Date: 10/11/19   Subjective   Chief Complaint: Obesity  Cindy Dixon is here to discuss her progress with her obesity treatment plan. She is on the the Category 3 Plan and states she is following her eating plan approximately 60 % of the time. She states she is exercising 75 minutes 5 times per week.  Weight Progress Since Last Visit:  Since last office visit she has lost 6 pounds. She reports good adherence to reduced calorie nutritional plan.  Reports following nutritional recommendations with consistent meal planning and portion control Uses food diary and mobile application to track intake.  Engages in regular physical activity.  Reports increased energy .  She has been working on reading food labels, not skipping meals, increasing protein intake at every meal, drinking more water, making healthier choices, reducing portion sizes, and incorporating more whole foods   Challenges affecting patient progress: none.   Orexigenic Control: Reports improved problems with appetite and hunger signals.  Reports improved problems with satiety and satiation.  Reports improved problems with eating patterns and portion control.  Reports improved abnormal cravings. Denies feeling deprived or restricted.   Pharmacotherapy for weight management: She is currently  taking Wegovy with adequate clinical response  and without side effects..   Assessment and Plan   Treatment Plan For Obesity:  Recommended Dietary Goals  Cindy Dixon is currently in the action stage of change. As such, her goal is to continue weight management plan. She has agreed to: continue current plan  Behavioral Health and Counseling  We discussed the following behavioral modification strategies today: continue to work on maintaining a reduced calorie state, getting the recommended amount of protein, incorporating whole foods, making healthy choices, staying well hydrated and practicing mindfulness when eating..  Additional education and resources provided today: None  Recommended Physical Activity Goals  Cindy Dixon has been advised to work up to 150 minutes of moderate intensity aerobic activity a week and strengthening exercises 2-3 times per week for cardiovascular health, weight loss maintenance and preservation of muscle mass.   She has agreed to :  Continue current level of physical activity   Pharmacotherapy  We discussed various medication options to help Cindy Dixon with her weight loss efforts and we both agreed to : adequate clinical response to current dose, continue current regimen  Associated Conditions Impacted by Obesity Treatment  Vitamin D deficiency Assessment & Plan: She is on vitamin D supplementation.  We will check vitamin D levels as she is at risk for vitamin D deficiency secondary to obesity.  Orders: -     VITAMIN D 25 Hydroxy (Vit-D Deficiency, Fractures)  Class 3 severe obesity with serious comorbidity and body mass index (BMI) of 45.0 to 49.9 in adult, unspecified obesity type [facility]indy Dixon) Assessment & Plan: Cindy Dixon has lost 33 pounds or 27% of  total body weight on medically supervised weight management plan inclusive of GLP-1 therapy.  She is under good job preserving muscle mass and adherence to a balanced nutrition.  She is also getting adequate  sleep and maintaining adequate hydration.  She is on Wegovy 1.7 without any adverse effects and will continue medication.  Orders: -     Wegovy; Inject 1.7 mg into the skin once a week.  Dispense: 3 mL; Refill: 0 -     CBC with Differential/Platelet -     CMP14+EGFR -     Lipid Panel With LDL/HDL Ratio -     TSH  Prediabetes Assessment & Plan: Improved.  We are checking disease monitoring labs today.  She continues to maintain a diet with limited simple and added sugars.  Orders: -     Wegovy; Inject 1.7 mg into the skin once a week.  Dispense: 3 mL; Refill: 0 -     Hemoglobin A1c -     Insulin, random  Abnormal food appetite Assessment & Plan: Improved significantly on Wegovy.  She had increased orexigenic signaling, impaired satiety and inhibitory control. 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  She also has insulin resistance and may have leptin resistance.  In addition to nutritional and behavioral strategies she benefits from ongoing pharmacotherapy.  She will continue Wegovy to 1.7 mg once a week   Orders: -     ZOXWRU; Inject 1.7 mg into the skin once a week.  Dispense: 3 mL; Refill: 0       Objective   Physical Exam:  Blood pressure 109/76, pulse 63, temperature 97.9 F (36.6 C), height 5\' 8"  (1.727 m), weight 237 lb (107.5 kg), last menstrual period 03/23/2023, SpO2 99%. Body mass index is 36.04 kg/m.  General: She is overweight, cooperative, alert, well developed, and in no acute distress. PSYCH: Has normal mood, affect and thought process.   HEENT: EOMI, sclerae are anicteric. Lungs: Normal breathing effort, no conversational dyspnea. Extremities: No edema.  Neurologic: No gross sensory or motor deficits. No tremors or fasciculations noted.    Diagnostic Data Reviewed:  BMET    Component Value Date/Time   NA 139 03/28/2022 0829   K 4.6 03/28/2022 0829   CL 103  03/28/2022 0829   CO2 23 03/28/2022 0829   GLUCOSE 90 03/28/2022 0829   GLUCOSE 115 (H) 04/05/2020 1632   BUN 13 03/28/2022 0829   CREATININE 0.61 03/28/2022 0829   CALCIUM 9.2 03/28/2022 0829   GFRNONAA >60 04/05/2020 1632   GFRAA 137 10/11/2019 1040   Lab Results  Component Value Date   HGBA1C 5.6 06/12/2022   HGBA1C 5.7 (H) 11/12/2018   Lab Results  Component Value Date   INSULIN 20.5 07/17/2021   INSULIN 34.1 (H) 10/11/2019   Lab Results  Component Value Date   TSH 2.540 10/11/2019   CBC    Component Value Date/Time   WBC 8.8 03/28/2022 0829   WBC 10.9 (H) 04/05/2020 1632   RBC 4.93 03/28/2022 0829   RBC 4.61 04/05/2020 1632   HGB 13.5 03/28/2022 0829   HCT 41.2 03/28/2022 0829   PLT 319 03/28/2022 0829   MCV 84 03/28/2022 0829   MCH 27.4 03/28/2022 0829   MCH 28.2 04/05/2020 1632   MCHC 32.8 03/28/2022 0829   MCHC 33.2 04/05/2020 1632   RDW 13.4 03/28/2022 0829   Iron Studies No results found for: "IRON", "TIBC", "FERRITIN", "IRONPCTSAT" Lipid Panel  Component Value Date/Time   CHOL 172 10/11/2019 1040   TRIG 155 (H) 10/11/2019 1040   HDL 36 (L) 10/11/2019 1040   CHOLHDL 4.0 11/12/2018 1508   LDLCALC 108 (H) 10/11/2019 1040   Hepatic Function Panel     Component Value Date/Time   PROT 7.1 03/28/2022 0829   ALBUMIN 4.4 03/28/2022 0829   AST 16 03/28/2022 0829   ALT 19 03/28/2022 0829   ALKPHOS 73 03/28/2022 0829   BILITOT <0.2 03/28/2022 0829      Component Value Date/Time   TSH 2.540 10/11/2019 1040   Nutritional Lab Results  Component Value Date   VD25OH 29.2 (L) 08/12/2022   VD25OH 25.2 (L) 11/20/2021   VD25OH 28.4 (L) 07/17/2021    Follow-Up   Return in about 4 weeks (around 05/14/2023) for For Weight Mangement with Dr. Rikki Spearing.Marland Kitchen She was informed of the importance of frequent follow up visits to maximize her success with intensive lifestyle modifications for her multiple health conditions.  Attestation Statement   Reviewed by  clinician on day of visit: allergies, medications, problem list, medical history, surgical history, family history, social history, and previous encounter notes.     Worthy Rancher, MD

## 2023-04-16 NOTE — Assessment & Plan Note (Signed)
 Improved.  We are checking disease monitoring labs today.  She continues to maintain a diet with limited simple and added sugars.

## 2023-04-17 ENCOUNTER — Encounter (INDEPENDENT_AMBULATORY_CARE_PROVIDER_SITE_OTHER): Payer: Self-pay | Admitting: Internal Medicine

## 2023-04-17 LAB — CBC WITH DIFFERENTIAL/PLATELET
Basophils Absolute: 0 10*3/uL (ref 0.0–0.2)
Basos: 0 %
EOS (ABSOLUTE): 0.1 10*3/uL (ref 0.0–0.4)
Eos: 1 %
Hematocrit: 43.5 % (ref 34.0–46.6)
Hemoglobin: 13.9 g/dL (ref 11.1–15.9)
Immature Grans (Abs): 0 10*3/uL (ref 0.0–0.1)
Immature Granulocytes: 0 %
Lymphocytes Absolute: 2.7 10*3/uL (ref 0.7–3.1)
Lymphs: 31 %
MCH: 27.6 pg (ref 26.6–33.0)
MCHC: 32 g/dL (ref 31.5–35.7)
MCV: 87 fL (ref 79–97)
Monocytes Absolute: 0.4 10*3/uL (ref 0.1–0.9)
Monocytes: 5 %
Neutrophils Absolute: 5.5 10*3/uL (ref 1.4–7.0)
Neutrophils: 63 %
Platelets: 336 10*3/uL (ref 150–450)
RBC: 5.03 x10E6/uL (ref 3.77–5.28)
RDW: 13.2 % (ref 11.7–15.4)
WBC: 8.8 10*3/uL (ref 3.4–10.8)

## 2023-04-17 LAB — CMP14+EGFR
ALT: 13 IU/L (ref 0–32)
AST: 13 IU/L (ref 0–40)
Albumin: 4.5 g/dL (ref 3.9–4.9)
Alkaline Phosphatase: 71 IU/L (ref 44–121)
BUN/Creatinine Ratio: 13 (ref 9–23)
BUN: 9 mg/dL (ref 6–20)
Bilirubin Total: 0.3 mg/dL (ref 0.0–1.2)
CO2: 21 mmol/L (ref 20–29)
Calcium: 9.6 mg/dL (ref 8.7–10.2)
Chloride: 103 mmol/L (ref 96–106)
Creatinine, Ser: 0.72 mg/dL (ref 0.57–1.00)
Globulin, Total: 2.9 g/dL (ref 1.5–4.5)
Glucose: 71 mg/dL (ref 70–99)
Potassium: 4.8 mmol/L (ref 3.5–5.2)
Sodium: 139 mmol/L (ref 134–144)
Total Protein: 7.4 g/dL (ref 6.0–8.5)
eGFR: 112 mL/min/{1.73_m2} (ref >59)

## 2023-04-17 LAB — INSULIN, RANDOM: INSULIN: 10.4 u[IU]/mL (ref 2.6–24.9)

## 2023-04-17 LAB — LIPID PANEL WITH LDL/HDL RATIO
Cholesterol, Total: 138 mg/dL (ref 100–199)
HDL: 39 mg/dL — ABNORMAL LOW (ref >39)
LDL Chol Calc (NIH): 80 mg/dL (ref 0–99)
LDL/HDL Ratio: 2.1 ratio (ref 0.0–3.2)
Triglycerides: 103 mg/dL (ref 0–149)
VLDL Cholesterol Cal: 19 mg/dL (ref 5–40)

## 2023-04-17 LAB — TSH: TSH: 1.64 u[IU]/mL (ref 0.450–4.500)

## 2023-04-17 LAB — HEMOGLOBIN A1C
Est. average glucose Bld gHb Est-mCnc: 103 mg/dL
Hgb A1c MFr Bld: 5.2 % (ref 4.8–5.6)

## 2023-04-17 LAB — VITAMIN D 25 HYDROXY (VIT D DEFICIENCY, FRACTURES): Vit D, 25-Hydroxy: 36.4 ng/mL (ref 30.0–100.0)

## 2023-04-21 ENCOUNTER — Other Ambulatory Visit (HOSPITAL_COMMUNITY): Payer: Self-pay

## 2023-05-20 ENCOUNTER — Ambulatory Visit (INDEPENDENT_AMBULATORY_CARE_PROVIDER_SITE_OTHER): Admitting: Internal Medicine

## 2023-05-20 ENCOUNTER — Other Ambulatory Visit (HOSPITAL_COMMUNITY): Payer: Self-pay

## 2023-05-20 ENCOUNTER — Encounter (INDEPENDENT_AMBULATORY_CARE_PROVIDER_SITE_OTHER): Payer: Self-pay | Admitting: Internal Medicine

## 2023-05-20 VITALS — BP 130/82 | HR 69 | Temp 98.2°F | Ht 68.0 in | Wt 240.0 lb

## 2023-05-20 DIAGNOSIS — R638 Other symptoms and signs concerning food and fluid intake: Secondary | ICD-10-CM | POA: Diagnosis not present

## 2023-05-20 DIAGNOSIS — Z6841 Body Mass Index (BMI) 40.0 and over, adult: Secondary | ICD-10-CM

## 2023-05-20 DIAGNOSIS — J301 Allergic rhinitis due to pollen: Secondary | ICD-10-CM | POA: Insufficient documentation

## 2023-05-20 DIAGNOSIS — R7303 Prediabetes: Secondary | ICD-10-CM

## 2023-05-20 DIAGNOSIS — E66813 Obesity, class 3: Secondary | ICD-10-CM | POA: Diagnosis not present

## 2023-05-20 MED ORDER — FLUTICASONE PROPIONATE 50 MCG/ACT NA SUSP
2.0000 | Freq: Every day | NASAL | 1 refills | Status: AC
  Filled 2023-05-20: qty 16, 30d supply, fill #0
  Filled 2023-08-10: qty 16, 30d supply, fill #1

## 2023-05-20 MED ORDER — SEMAGLUTIDE-WEIGHT MANAGEMENT 2.4 MG/0.75ML ~~LOC~~ SOAJ
2.4000 mg | SUBCUTANEOUS | 0 refills | Status: DC
  Filled 2023-05-20: qty 3, 28d supply, fill #0

## 2023-05-20 NOTE — Assessment & Plan Note (Signed)
 Most recent A1c was 5.2 and has improved significantly.  She is currently on medically supervised weight management plan has lost a significant amount of weight and is currently on Wegovy for pharmacoprophylaxis and weight management.  She will continue current weight management strategy inclusive of GLP-1

## 2023-05-20 NOTE — Assessment & Plan Note (Signed)
 According to our scale she has gained 3 pounds but actually has gained 2 pounds in muscle and 3 pounds in water with a reduction in body fat percentage.  So overall improvement in body composition even though no weight loss.  She is having some breakthrough symptoms especially towards the next dose of her Wegovy.  We will therefore increase the dose to 2.4 mg once a week.  She will continue with reduced calorie nutrition plan and increasing or progressing her physical activity.

## 2023-05-20 NOTE — Progress Notes (Signed)
 Office: [phone]  /  Fax: [phone]  Weight Summary And Biometrics  Vitals Temp: 98.2 F (36.8 C) BP: 130/82 Pulse Rate: 69 SpO2: 95 %   Anthropometric Measurements Height: 5\' 8"  (1.727 m) Weight: 240 lb (108.9 kg) BMI (Calculated): 36.5 Weight at Last Visit: 237 lb Weight Lost Since Last Visit: 0 lb Weight Gained Since Last Visit: 3 lb Starting Weight: 321 lb Total Weight Loss (lbs): 80 lb (36.3 kg)   Body Composition  Body Fat %: 39.4 % Fat Mass (lbs): 94.8 lbs Muscle Mass (lbs): 138.4 lbs Total Body Water (lbs): 92.8 lbs Visceral Fat Rating : 9    No data recorded Today's Visit #: 49  Starting Date: 10/11/19   Subjective   Chief Complaint: Obesity  Cindy Dixon is here to discuss her progress with her obesity treatment plan. She is on the the Category 3 Plan and states she is following her eating plan approximately 80-90% of the time. She states she is exercising 50 minutes 5 times per week.  Weight Progress Since Last Visit:  Since last office visit she has gained 3 pounds. She reports good adherence to reduced calorie nutritional plan. She has been working on reading food labels, not skipping meals, increasing protein intake at every meal, drinking more water, making healthier choices, reducing portion sizes, and incorporating more whole foods . Denies nutritional changes  Challenges affecting patient progress:  Noticing increasing hunger and cravings .   Orexigenic Control: Reports problems with appetite and hunger signals.  Reports problems with satiety and satiation.  Denies problems with eating patterns and portion control.  Reports abnormal cravings. Denies feeling deprived or restricted.   Pharmacotherapy for weight management: She is currently taking Wegovy with adequate clinical response  and without side effects..   Assessment and Plan   Treatment Plan For Obesity:  Recommended Dietary Goals  Cindy Dixon is currently in the  action stage of change. As such, her goal is to continue weight management plan. She has agreed to: continue current plan  Behavioral Health and Counseling  We discussed the following behavioral modification strategies today: continue to work on maintaining a reduced calorie state, getting the recommended amount of protein, incorporating whole foods, making healthy choices, staying well hydrated and practicing mindfulness when eating..  Additional education and resources provided today: None  Recommended Physical Activity Goals  Cindy Dixon has been advised to work up to 150 minutes of moderate intensity aerobic activity a week and strengthening exercises 2-3 times per week for cardiovascular health, weight loss maintenance and preservation of muscle mass.   She has agreed to :  continue to gradually increase the amount and intensity of exercise routine  Pharmacotherapy  We discussed various medication options to help Cindy Dixon with her weight loss efforts and we both agreed to : increase wegovy to 2.4 mg once week  Associated Conditions Impacted by Obesity Treatment  Seasonal allergic rhinitis due to pollen Assessment & Plan: Patient reports problems with itchy eyes, nasal congestion and sneezing.  She is not taking any over-the-counter remedies.  No fever or signs of a sinus infection.  We discussed treatment options including nasal fluticasone, ketotifen eyedrops and loratadine.  She will be prescribed fluticasone 2 sprays each nostril daily.  Provided instructions on use.  Orders: -     Fluticasone Propionate; Place 2 sprays into both nostrils daily.  Dispense: 16 g; Refill: 1  Class 3 severe obesity with serious comorbidity and body mass index (BMI) of 45.0 to 49.9 in adult, unspecified  obesity type [facility]) Assessment & Plan: According to our scale she has gained 3 pounds but actually has gained 2 pounds in muscle and 3 pounds in water with a reduction in body fat percentage.  So  overall improvement in body composition even though no weight loss.  She is having some breakthrough symptoms especially towards the next dose of her Wegovy.  We will therefore increase the dose to 2.4 mg once a week.  She will continue with reduced calorie nutrition plan and increasing or progressing her physical activity.  Orders: -     Semaglutide-Weight Management; Inject 2.4 mg into the skin once a week for 28 days.  Dispense: 3 mL; Refill: 0  Prediabetes Assessment & Plan: Most recent A1c was 5.2 and has improved significantly.  She is currently on medically supervised weight management plan has lost a significant amount of weight and is currently on Wegovy for pharmacoprophylaxis and weight management.  She will continue current weight management strategy inclusive of GLP-1    Abnormal food appetite Assessment & Plan: She is having some breakthrough symptoms at the current dose of Wegovy.  We will therefore increase medication to 2.4 mg once a week.  She maintains relatively good intake of protein about 30 to 40 g per meal.  She is also dealing with cravings in a healthy way particularly around boredom.      Objective   Physical Exam:  Blood pressure 130/82, pulse 69, temperature 98.2 F (36.8 C), height 5\' 8"  (1.727 m), weight 240 lb (108.9 kg), last menstrual period 04/30/2023, SpO2 95%. Body mass index is 36.49 kg/m.  General: She is overweight, cooperative, alert, well developed, and in no acute distress. PSYCH: Has normal mood, affect and thought process.   HEENT: EOMI, sclerae are anicteric. Lungs: Normal breathing effort, no conversational dyspnea. Extremities: No edema.  Neurologic: No gross sensory or motor deficits. No tremors or fasciculations noted.    Diagnostic Data Reviewed:  BMET    Component Value Date/Time   NA 139 04/16/2023 0823   K 4.8 04/16/2023 0823   CL 103 04/16/2023 0823   CO2 21 04/16/2023 0823   GLUCOSE 71 04/16/2023 0823   GLUCOSE 115  (H) 04/05/2020 1632   BUN 9 04/16/2023 0823   CREATININE 0.72 04/16/2023 0823   CALCIUM 9.6 04/16/2023 0823   GFRNONAA >60 04/05/2020 1632   GFRAA 137 10/11/2019 1040   Lab Results  Component Value Date   HGBA1C 5.2 04/16/2023   HGBA1C 5.7 (H) 11/12/2018   Lab Results  Component Value Date   INSULIN 10.4 04/16/2023   INSULIN 34.1 (H) 10/11/2019   Lab Results  Component Value Date   TSH 1.640 04/16/2023   CBC    Component Value Date/Time   WBC 8.8 04/16/2023 0823   WBC 10.9 (H) 04/05/2020 1632   RBC 5.03 04/16/2023 0823   RBC 4.61 04/05/2020 1632   HGB 13.9 04/16/2023 0823   HCT 43.5 04/16/2023 0823   PLT 336 04/16/2023 0823   MCV 87 04/16/2023 0823   MCH 27.6 04/16/2023 0823   MCH 28.2 04/05/2020 1632   MCHC 32.0 04/16/2023 0823   MCHC 33.2 04/05/2020 1632   RDW 13.2 04/16/2023 0823   Iron Studies No results found for: "IRON", "TIBC", "FERRITIN", "IRONPCTSAT" Lipid Panel     Component Value Date/Time   CHOL 138 04/16/2023 0823   TRIG 103 04/16/2023 0823   HDL 39 (L) 04/16/2023 0823   CHOLHDL 4.0 11/12/2018 1508   LDLCALC 80 04/16/2023  1610   Hepatic Function Panel     Component Value Date/Time   PROT 7.4 04/16/2023 0823   ALBUMIN 4.5 04/16/2023 0823   AST 13 04/16/2023 0823   ALT 13 04/16/2023 0823   ALKPHOS 71 04/16/2023 0823   BILITOT 0.3 04/16/2023 0823      Component Value Date/Time   TSH 1.640 04/16/2023 0823   Nutritional Lab Results  Component Value Date   VD25OH 36.4 04/16/2023   VD25OH 29.2 (L) 08/12/2022   VD25OH 25.2 (L) 11/20/2021    Medications: Outpatient Encounter Medications as of 05/20/2023  Medication Sig   Cholecalciferol (VITAMIN D3) 25 MCG (1000 UT) CAPS Take 1 capsule (1,000 Units total) by mouth daily.   fluticasone (FLONASE) 50 MCG/ACT nasal spray Place 2 sprays into both nostrils daily.   Multiple Vitamin (MULTIVITAMIN WITH MINERALS) TABS tablet Take 1 tablet by mouth daily.   ondansetron (ZOFRAN) 4 MG tablet Take 1  tablet (4 mg total) by mouth every 8 (eight) hours as needed for nausea or vomiting.   pantoprazole (PROTONIX) 40 MG tablet Take 1 tablet (40 mg total) by mouth daily.   Semaglutide-Weight Management 2.4 MG/0.75ML SOAJ Inject 2.4 mg into the skin once a week for 28 days.   SUMAtriptan (IMITREX) 50 MG tablet Take 1 tablet (50 mg total) by mouth as needed for migraine. May repeat in 2 hours if headache persists or recurs. Do not exceed 2 tablets in 24 hours.   [DISCONTINUED] Semaglutide-Weight Management (WEGOVY) 1.7 MG/0.75ML SOAJ Inject 1.7 mg into the skin once a week.   No facility-administered encounter medications on file as of 05/20/2023.     Follow-Up   Return in about 4 weeks (around 06/17/2023) for For Weight Mangement with Dr. Rikki Spearing.Marland Kitchen She was informed of the importance of frequent follow up visits to maximize her success with intensive lifestyle modifications for her multiple health conditions.  Attestation Statement   Reviewed by clinician on day of visit: allergies, medications, problem list, medical history, surgical history, family history, social history, and previous encounter notes.     Worthy Rancher, MD

## 2023-05-20 NOTE — Assessment & Plan Note (Signed)
 Patient reports problems with itchy eyes, nasal congestion and sneezing.  She is not taking any over-the-counter remedies.  No fever or signs of a sinus infection.  We discussed treatment options including nasal fluticasone, ketotifen eyedrops and loratadine.  She will be prescribed fluticasone 2 sprays each nostril daily.  Provided instructions on use.

## 2023-05-20 NOTE — Assessment & Plan Note (Signed)
 She is having some breakthrough symptoms at the current dose of Wegovy.  We will therefore increase medication to 2.4 mg once a week.  She maintains relatively good intake of protein about 30 to 40 g per meal.  She is also dealing with cravings in a healthy way particularly around boredom.

## 2023-06-12 ENCOUNTER — Other Ambulatory Visit (INDEPENDENT_AMBULATORY_CARE_PROVIDER_SITE_OTHER): Payer: Self-pay

## 2023-06-12 ENCOUNTER — Encounter (INDEPENDENT_AMBULATORY_CARE_PROVIDER_SITE_OTHER): Payer: Self-pay

## 2023-06-12 ENCOUNTER — Other Ambulatory Visit (INDEPENDENT_AMBULATORY_CARE_PROVIDER_SITE_OTHER): Payer: Self-pay | Admitting: Internal Medicine

## 2023-06-12 ENCOUNTER — Other Ambulatory Visit (HOSPITAL_COMMUNITY): Payer: Self-pay

## 2023-06-12 ENCOUNTER — Telehealth (INDEPENDENT_AMBULATORY_CARE_PROVIDER_SITE_OTHER): Payer: Self-pay | Admitting: Internal Medicine

## 2023-06-12 MED ORDER — SEMAGLUTIDE-WEIGHT MANAGEMENT 2.4 MG/0.75ML ~~LOC~~ SOAJ
2.4000 mg | SUBCUTANEOUS | 0 refills | Status: DC
  Filled 2023-06-12: qty 3, 28d supply, fill #0

## 2023-06-12 NOTE — Telephone Encounter (Signed)
 Patient's daughter called in stating this patient has an appointment with Dr. Allie Area on 07/15/23 however she will run out next week. She has a dose for this week. She was here last on 05/20/23. Please send script to [facility] outpatient pharmacy.

## 2023-06-17 ENCOUNTER — Other Ambulatory Visit (HOSPITAL_COMMUNITY): Payer: Self-pay

## 2023-07-15 ENCOUNTER — Other Ambulatory Visit (HOSPITAL_COMMUNITY): Payer: Self-pay

## 2023-07-15 ENCOUNTER — Ambulatory Visit (INDEPENDENT_AMBULATORY_CARE_PROVIDER_SITE_OTHER): Admitting: Internal Medicine

## 2023-07-15 ENCOUNTER — Encounter (INDEPENDENT_AMBULATORY_CARE_PROVIDER_SITE_OTHER): Payer: Self-pay | Admitting: Internal Medicine

## 2023-07-15 ENCOUNTER — Other Ambulatory Visit: Payer: Self-pay

## 2023-07-15 VITALS — BP 117/82 | HR 70 | Temp 98.0°F | Ht 68.0 in | Wt 241.0 lb

## 2023-07-15 DIAGNOSIS — R7303 Prediabetes: Secondary | ICD-10-CM | POA: Diagnosis not present

## 2023-07-15 DIAGNOSIS — Z6841 Body Mass Index (BMI) 40.0 and over, adult: Secondary | ICD-10-CM

## 2023-07-15 DIAGNOSIS — E66813 Obesity, class 3: Secondary | ICD-10-CM

## 2023-07-15 DIAGNOSIS — R638 Other symptoms and signs concerning food and fluid intake: Secondary | ICD-10-CM

## 2023-07-15 DIAGNOSIS — E669 Obesity, unspecified: Secondary | ICD-10-CM | POA: Insufficient documentation

## 2023-07-15 MED ORDER — SEMAGLUTIDE-WEIGHT MANAGEMENT 2.4 MG/0.75ML ~~LOC~~ SOAJ
2.4000 mg | SUBCUTANEOUS | 1 refills | Status: DC
Start: 2023-07-15 — End: 2023-08-20
  Filled 2023-07-15: qty 3, 28d supply, fill #0
  Filled 2023-08-10: qty 3, 28d supply, fill #1

## 2023-07-15 NOTE — Progress Notes (Signed)
 Office: [phone]  /  Fax: [phone]  Weight Summary And Biometrics  Vitals Temp: 98 F (36.7 C) BP: 117/82 Pulse Rate: 70 SpO2: 98 %   Anthropometric Measurements Height: 5\' 8"  (1.727 m) Weight: 241 lb (109.3 kg) BMI (Calculated): 36.65 Weight at Last Visit: 240  lb Weight Lost Since Last Visit: 0 lb Weight Gained Since Last Visit: 1 lb Starting Weight: 321 lb Total Weight Loss (lbs): 79 lb (35.8 kg)   Body Composition  Body Fat %: 41.6 % Fat Mass (lbs): 100.2 lbs Muscle Mass (lbs): 133.8 lbs Total Body Water (lbs): 92 lbs Visceral Fat Rating : 9    RMR: 2121  Today's Visit #: 50  Starting Date: 10/11/19   Subjective   Chief Complaint: Obesity  Interval History Discussed the use of AI scribe software for clinical note transcription with the patient, who gave verbal consent to proceed.  History of Present Illness   Cindy Dixon is a 33 year old female who presents for medical weight management.  She has been on Wegovy  2.4 mg once a week since August 2024, resulting in a 79-pound weight loss, which is 25% of her total body weight. However, she has gained one pound since her last office visit. She follows a 1500 calorie meal plan about 50-60% of the time but is not consistently tracking calories or maintaining adequate hydration. She skips meals and may not be consuming enough protein.  She exercises five days a week for 45 minutes, engaging in strength training, cardio, yoga, and stretching. She reports adequate sleep but is experiencing higher levels of stress.  During a recent vacation, she maintained her weight despite dietary indulgences, such as consuming pastrami and [place] sandwiches. Her appetite is well-controlled with Wegovy , feeling full quickly. No constipation or nausea.  Her prediabetes has improved, with reductions in A1c from 5.7 to 5.2 and insulin  levels from 34 to 10.       Challenges affecting patient progress: none.     Pharmacotherapy for weight management: She is currently taking Wegovy  with adequate clinical response  and without side effects..   Assessment and Plan   Treatment Plan For Obesity:  Recommended Dietary Goals  Cindy Dixon is currently in the action stage of change. As such, her goal is to continue weight management plan. She has agreed to: follow the Category 3 plan - 1500 kcal per day  Behavioral Health and Counseling  We discussed the following behavioral modification strategies today: staying on track while traveling and vacationing and continue to work on maintaining a reduced calorie state, getting the recommended amount of protein, incorporating whole foods, making healthy choices, staying well hydrated and practicing mindfulness when eating..  Additional education and resources provided today: None  Recommended Physical Activity Goals  Cindy Dixon has been advised to work up to 150 minutes of moderate intensity aerobic activity a week and strengthening exercises 2-3 times per week for cardiovascular health, weight loss maintenance and preservation of muscle mass.   She has agreed to :  continue to gradually increase the amount and intensity of exercise routine  Pharmacotherapy  We discussed various medication options to help Cindy Dixon with her weight loss efforts and we both agreed to : Adequate clinical response to anti-obesity medication, continue current regimen  Associated Conditions Impacted by Obesity Treatment  Class 3 severe obesity with serious comorbidity and body mass index (BMI) of 45.0 to 49.9 in adult, unspecified obesity type  Prediabetes  Abnormal food appetite     Assessment  and Plan    Obesity /abnormal food appetite She is undergoing medical weight management with Wegovy  2.4 mg weekly since August 2024, resulting in an 89-pound weight loss (27% reduction in total body weight). However, she gained one pound since the last visit. She adheres to a  1500 calorie meal plan 50-60% of the time but does not track calories, maintain adequate hydration, or consume sufficient protein. She exercises five days a week, including strength, cardio, yoga, and stretching. Stress levels are high. Recently returned from vacation and is resuming her dietary regimen. Wegovy  effectively controls her appetite without side effects such as constipation or nausea. Emphasized the importance of not skipping meals and consuming sufficient protein to maintain weight loss and muscle mass. - Continue Wegovy  2.4 mg once a week - Encourage adherence to a 1500 calorie meal plan - Advise tracking of calories and maintaining adequate hydration - Emphasize the importance of consuming sufficient protein - Encourage continuation of regular exercise regimen - Discuss stress management techniques  Prediabetes Her prediabetes has improved with A1c reduced to 5.2 from 5.7, and insulin  levels decreased from 34 to 10, indicating improved insulin  sensitivity and a reduced risk of developing diabetes. - Continue current management plan to maintain improved A1c and insulin  levels  General Health Maintenance She takes a multivitamin and reports adequate sleep. Advised to consume less processed food and more fruits, vegetables, protein, and fiber to support overall health and weight management. Emphasized the importance of maintaining a balanced diet to support the effects of Wegovy . - Continue taking a multivitamin - Advise consumption of less processed food and more fruits, vegetables, protein, and fiber         Objective   Physical Exam:  Blood pressure 117/82, pulse 70, temperature 98 F (36.7 C), height 5\' 8"  (1.727 m), weight 241 lb (109.3 kg), last menstrual period 06/20/2023, SpO2 98%. Body mass index is 36.64 kg/m.  General: She is overweight, cooperative, alert, well developed, and in no acute distress. PSYCH: Has normal mood, affect and thought process.   HEENT:  EOMI, sclerae are anicteric. Lungs: Normal breathing effort, no conversational dyspnea. Extremities: No edema.  Neurologic: No gross sensory or motor deficits. No tremors or fasciculations noted.    Diagnostic Data Reviewed:  BMET    Component Value Date/Time   NA 139 04/16/2023 0823   K 4.8 04/16/2023 0823   CL 103 04/16/2023 0823   CO2 21 04/16/2023 0823   GLUCOSE 71 04/16/2023 0823   GLUCOSE 115 (H) 04/05/2020 1632   BUN 9 04/16/2023 0823   CREATININE 0.72 04/16/2023 0823   CALCIUM 9.6 04/16/2023 0823   GFRNONAA >60 04/05/2020 1632   GFRAA 137 10/11/2019 1040   Lab Results  Component Value Date   HGBA1C 5.2 04/16/2023   HGBA1C 5.7 (H) 11/12/2018   Lab Results  Component Value Date   INSULIN  10.4 04/16/2023   INSULIN  34.1 (H) 10/11/2019   Lab Results  Component Value Date   TSH 1.640 04/16/2023   CBC    Component Value Date/Time   WBC 8.8 04/16/2023 0823   WBC 10.9 (H) 04/05/2020 1632   RBC 5.03 04/16/2023 0823   RBC 4.61 04/05/2020 1632   HGB 13.9 04/16/2023 0823   HCT 43.5 04/16/2023 0823   PLT 336 04/16/2023 0823   MCV 87 04/16/2023 0823   MCH 27.6 04/16/2023 0823   MCH 28.2 04/05/2020 1632   MCHC 32.0 04/16/2023 0823   MCHC 33.2 04/05/2020 1632   RDW 13.2 04/16/2023 0823  Iron Studies No results found for: "IRON", "TIBC", "FERRITIN", "IRONPCTSAT" Lipid Panel     Component Value Date/Time   CHOL 138 04/16/2023 0823   TRIG 103 04/16/2023 0823   HDL 39 (L) 04/16/2023 0823   CHOLHDL 4.0 11/12/2018 1508   LDLCALC 80 04/16/2023 0823   Hepatic Function Panel     Component Value Date/Time   PROT 7.4 04/16/2023 0823   ALBUMIN 4.5 04/16/2023 0823   AST 13 04/16/2023 0823   ALT 13 04/16/2023 0823   ALKPHOS 71 04/16/2023 0823   BILITOT 0.3 04/16/2023 0823      Component Value Date/Time   TSH 1.640 04/16/2023 0823   Nutritional Lab Results  Component Value Date   VD25OH 36.4 04/16/2023   VD25OH 29.2 (L) 08/12/2022   VD25OH 25.2 (L)  11/20/2021    Medications: Outpatient Encounter Medications as of 07/15/2023  Medication Sig   Cholecalciferol  (VITAMIN D3) 25 MCG (1000 UT) CAPS Take 1 capsule (1,000 Units total) by mouth daily.   fluticasone  (FLONASE ) 50 MCG/ACT nasal spray Place 2 sprays into both nostrils daily.   Multiple Vitamin (MULTIVITAMIN WITH MINERALS) TABS tablet Take 1 tablet by mouth daily.   ondansetron  (ZOFRAN ) 4 MG tablet Take 1 tablet (4 mg total) by mouth every 8 (eight) hours as needed for nausea or vomiting.   pantoprazole  (PROTONIX ) 40 MG tablet Take 1 tablet (40 mg total) by mouth daily.   Semaglutide -Weight Management 2.4 MG/0.75ML SOAJ Inject 2.4 mg into the skin once a week.   SUMAtriptan  (IMITREX ) 50 MG tablet Take 1 tablet (50 mg total) by mouth as needed for migraine. May repeat in 2 hours if headache persists or recurs. Do not exceed 2 tablets in 24 hours.   No facility-administered encounter medications on file as of 07/15/2023.     Follow-Up   No follow-ups on file.Aaron Aas She was informed of the importance of frequent follow up visits to maximize her success with intensive lifestyle modifications for her multiple health conditions.  Attestation Statement   Reviewed by clinician on day of visit: allergies, medications, problem list, medical history, surgical history, family history, social history, and previous encounter notes.     Ladd Picker, MD

## 2023-08-14 ENCOUNTER — Other Ambulatory Visit (HOSPITAL_COMMUNITY): Payer: Self-pay

## 2023-08-20 ENCOUNTER — Ambulatory Visit (INDEPENDENT_AMBULATORY_CARE_PROVIDER_SITE_OTHER): Admitting: Internal Medicine

## 2023-08-20 ENCOUNTER — Other Ambulatory Visit (HOSPITAL_COMMUNITY): Payer: Self-pay

## 2023-08-20 ENCOUNTER — Encounter (INDEPENDENT_AMBULATORY_CARE_PROVIDER_SITE_OTHER): Payer: Self-pay | Admitting: Internal Medicine

## 2023-08-20 VITALS — BP 110/73 | HR 72 | Temp 97.7°F | Ht 68.0 in | Wt 239.0 lb

## 2023-08-20 DIAGNOSIS — Z6841 Body Mass Index (BMI) 40.0 and over, adult: Secondary | ICD-10-CM

## 2023-08-20 DIAGNOSIS — R7303 Prediabetes: Secondary | ICD-10-CM | POA: Diagnosis not present

## 2023-08-20 DIAGNOSIS — E66813 Obesity, class 3: Secondary | ICD-10-CM

## 2023-08-20 DIAGNOSIS — Z6837 Body mass index (BMI) 37.0-37.9, adult: Secondary | ICD-10-CM

## 2023-08-20 DIAGNOSIS — R638 Other symptoms and signs concerning food and fluid intake: Secondary | ICD-10-CM

## 2023-08-20 MED ORDER — SEMAGLUTIDE-WEIGHT MANAGEMENT 2.4 MG/0.75ML ~~LOC~~ SOAJ
2.4000 mg | SUBCUTANEOUS | 1 refills | Status: DC
  Filled 2023-08-20 – 2023-09-06 (×2): qty 3, 28d supply, fill #0

## 2023-08-20 NOTE — Assessment & Plan Note (Signed)
 Most recent A1c was 5.2 and has improved significantly.  She is currently on medically supervised weight management plan has lost a significant amount of weight and is currently on Wegovy for pharmacoprophylaxis and weight management.  She will continue current weight management strategy inclusive of GLP-1

## 2023-08-20 NOTE — Assessment & Plan Note (Signed)
 Improved on GLP-1.  She has increased orexigenic signaling, impaired satiety and inhibitory control. 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  In addition to nutritional and behavioral strategies she benefits from ongoing pharmacotherapy.

## 2023-08-20 NOTE — Assessment & Plan Note (Signed)
 She has lost 82 pounds or 26% of total body weight loss which surpassed this was expected with Wegovy  likely due to combination of nutritional, behavioral, physical activity and pharmacotherapy.  She has done a good job with muscle mass preservation and reductions in SAT and VAT.  Continue current weight management strategy.  Counseled on maintaining adequate hydration, protein intake between 90 to 120 g/day, fiber 25 g/day.

## 2023-08-20 NOTE — Progress Notes (Deleted)
 Office: [phone]  /  Fax: [phone]  Weight Summary and Body Composition Analysis (BIA)  Vitals Temp: 97.7 F (36.5 C) BP: 110/73 Pulse Rate: 72 SpO2: 97 %   Anthropometric Measurements Height: 5' 8 (1.727 m) Weight: 239 lb (108.4 kg) BMI (Calculated): 36.35 Weight at Last Visit: 241 lb Weight Lost Since Last Visit: 2 lb Weight Gained Since Last Visit: 0 lb Starting Weight: 321 lb Total Weight Loss (lbs): 82 lb (37.2 kg)   Body Composition  Body Fat %: 40.5 % Fat Mass (lbs): 97 lbs Muscle Mass (lbs): 135.4 lbs Total Body Water (lbs): 91.4 lbs Visceral Fat Rating : 9    RMR: 2121  Today's Visit #: 51  Starting Date: 10/11/22   Subjective   Chief Complaint: Obesity  Interval History ***  Challenges affecting patient progress: {EMOBESITYBARRIERS:28841::none}.    Pharmacotherapy for weight management: She is currently taking {EMPharmaco:28845}.   Assessment and Plan   Treatment Plan For Obesity:  Recommended Dietary Goals  Tykeria is currently in the action stage of change. As such, her goal is to continue weight management plan. She has agreed to: {EMWTLOSSPLAN:29297::continue current plan}  Behavioral Health and Counseling  We discussed the following behavioral modification strategies today: {EMWMwtlossstrategies:28914::continue to work on maintaining a reduced calorie state, getting the recommended amount of protein, incorporating whole foods, making healthy choices, staying well hydrated and practicing mindfulness when eating.}.  Additional education and resources provided today: {EMadditionalresources:29169::None}  Recommended Physical Activity Goals  Denika has been advised to work up to 150 minutes of moderate intensity aerobic activity a week and strengthening exercises 2-3 times per week for cardiovascular health, weight loss maintenance and preservation of muscle mass.   She has agreed to :   {EMEXERCISE:28847::Think about enjoyable ways to increase daily physical activity and overcoming barriers to exercise,Increase physical activity in their day and reduce sedentary time (increase NEAT).}  Medical Interventions and Pharmacotherapy  We discussed various medication options to help Zarayah with her weight loss efforts and we both agreed to : {EMagreedrx:29170}  Associated Conditions Impacted by Obesity Treatment  Assessment & Plan Class 3 severe obesity with serious comorbidity and body mass index (BMI) of 45.0 to 49.9 in adult, unspecified obesity type     ***  Objective   Physical Exam:  Blood pressure 110/73, pulse 72, temperature 97.7 F (36.5 C), height 5' 8 (1.727 m), weight 239 lb (108.4 kg), last menstrual period 08/20/2023, SpO2 97%. Body mass index is 36.34 kg/m.  General: She is overweight, cooperative, alert, well developed, and in no acute distress. PSYCH: Has normal mood, affect and thought process.   HEENT: EOMI, sclerae are anicteric. Lungs: Normal breathing effort, no conversational dyspnea. Extremities: No edema.  Neurologic: No gross sensory or motor deficits. No tremors or fasciculations noted.    Diagnostic Data Reviewed:  BMET    Component Value Date/Time   NA 139 04/16/2023 0823   K 4.8 04/16/2023 0823   CL 103 04/16/2023 0823   CO2 21 04/16/2023 0823   GLUCOSE 71 04/16/2023 0823   GLUCOSE 115 (H) 04/05/2020 1632   BUN 9 04/16/2023 0823   CREATININE 0.72 04/16/2023 0823   CALCIUM 9.6 04/16/2023 0823   GFRNONAA >60 04/05/2020 1632   GFRAA 137 10/11/2019 1040   Lab Results  Component Value Date   HGBA1C 5.2 04/16/2023   HGBA1C 5.7 (H) 11/12/2018   Lab Results  Component Value Date   INSULIN  10.4 04/16/2023   INSULIN  34.1 (H) 10/11/2019   Lab Results  Component Value Date   TSH 1.640 04/16/2023   CBC    Component Value Date/Time   WBC 8.8 04/16/2023 0823   WBC 10.9 (H) 04/05/2020 1632   RBC 5.03 04/16/2023  0823   RBC 4.61 04/05/2020 1632   HGB 13.9 04/16/2023 0823   HCT 43.5 04/16/2023 0823   PLT 336 04/16/2023 0823   MCV 87 04/16/2023 0823   MCH 27.6 04/16/2023 0823   MCH 28.2 04/05/2020 1632   MCHC 32.0 04/16/2023 0823   MCHC 33.2 04/05/2020 1632   RDW 13.2 04/16/2023 0823   Iron Studies No results found for: IRON, TIBC, FERRITIN, IRONPCTSAT Lipid Panel     Component Value Date/Time   CHOL 138 04/16/2023 0823   TRIG 103 04/16/2023 0823   HDL 39 (L) 04/16/2023 0823   CHOLHDL 4.0 11/12/2018 1508   LDLCALC 80 04/16/2023 0823   Hepatic Function Panel     Component Value Date/Time   PROT 7.4 04/16/2023 0823   ALBUMIN 4.5 04/16/2023 0823   AST 13 04/16/2023 0823   ALT 13 04/16/2023 0823   ALKPHOS 71 04/16/2023 0823   BILITOT 0.3 04/16/2023 0823      Component Value Date/Time   TSH 1.640 04/16/2023 0823   Nutritional Lab Results  Component Value Date   VD25OH 36.4 04/16/2023   VD25OH 29.2 (L) 08/12/2022   VD25OH 25.2 (L) 11/20/2021    Medications: Outpatient Encounter Medications as of 08/20/2023  Medication Sig   Cholecalciferol  (VITAMIN D3) 25 MCG (1000 UT) CAPS Take 1 capsule (1,000 Units total) by mouth daily.   fluticasone  (FLONASE ) 50 MCG/ACT nasal spray Place 2 sprays into both nostrils daily.   Multiple Vitamin (MULTIVITAMIN WITH MINERALS) TABS tablet Take 1 tablet by mouth daily.   ondansetron  (ZOFRAN ) 4 MG tablet Take 1 tablet (4 mg total) by mouth every 8 (eight) hours as needed for nausea or vomiting.   pantoprazole  (PROTONIX ) 40 MG tablet Take 1 tablet (40 mg total) by mouth daily.   Semaglutide -Weight Management 2.4 MG/0.75ML SOAJ Inject 2.4 mg into the skin once a week.   SUMAtriptan  (IMITREX ) 50 MG tablet Take 1 tablet (50 mg total) by mouth as needed for migraine. May repeat in 2 hours if headache persists or recurs. Do not exceed 2 tablets in 24 hours.   No facility-administered encounter medications on file as of 08/20/2023.     Follow-Up    No follow-ups on file.Cindy Dixon She was informed of the importance of frequent follow up visits to maximize her success with intensive lifestyle modifications for her multiple health conditions.  Attestation Statement   Reviewed by clinician on day of visit: allergies, medications, problem list, medical history, surgical history, family history, social history, and previous encounter notes.     Lucas Parker, MD

## 2023-08-20 NOTE — Progress Notes (Signed)
 Office: [phone]  /  Fax: [phone]  Weight Summary And Body Composition Analysis (BIA)  Vitals Temp: 97.7 F (36.5 C) BP: 110/73 Pulse Rate: 72 SpO2: 97 %   Anthropometric Measurements Height: 5' 8 (1.727 m) Weight: 239 lb (108.4 kg) BMI (Calculated): 36.35 Weight at Last Visit: 241 lb Weight Lost Since Last Visit: 2 lb Weight Gained Since Last Visit: 0 lb Starting Weight: 321 lb Total Weight Loss (lbs): 82 lb (37.2 kg)   Body Composition  Body Fat %: 40.5 % Fat Mass (lbs): 97 lbs Muscle Mass (lbs): 135.4 lbs Total Body Water (lbs): 91.4 lbs Visceral Fat Rating : 9    RMR: 2121  Today's Visit #: 51  Starting Date: 10/11/22   Subjective   Chief Complaint: Obesity  Cindy Dixon is here to discuss her progress with her obesity treatment plan. She is following the Category 3 plan - 1500 kcal per day and states she is following her eating plan approximately 70-80% of the time. She states she is exercising 60 minutes 5 times per week..  Weight Progress Since Last Visit:  Since last office visit she has lost 2 pounds. She reports good adherence to reduced calorie nutritional plan. She has been working on reading food labels, not skipping meals, increasing protein intake at every meal, eating more fruits, drinking more water, making healthier choices, reducing portion sizes, and incorporating more whole foods   Nutritional 24 HR Recall: Intake consistent with prescribed nutritional plan  Challenges affecting patient progress: none.   Orexigenic Control: Denies problems with appetite and hunger signals.  Denies problems with satiety and satiation.  Denies problems with eating patterns and portion control.  Denies abnormal cravings. Denies feeling deprived or restricted.   Pharmacotherapy for weight management: She is currently taking Wegovy  with adequate clinical response  and without side effects..   Assessment and Plan   Treatment Plan For  Obesity:  Recommended Dietary Goals  Leili is currently in the action stage of change. As such, her goal is to continue weight management plan. She has agreed to: continue current plan  Behavioral Health and Counseling  We discussed the following behavioral modification strategies today: continue to work on maintaining a reduced calorie state, getting the recommended amount of protein, incorporating whole foods, making healthy choices, staying well hydrated and practicing mindfulness when eating..  Additional education and resources provided today: None  Recommended Physical Activity Goals  Seena has been advised to work up to 150 minutes of moderate intensity aerobic activity a week and strengthening exercises 2-3 times per week for cardiovascular health, weight loss maintenance and preservation of muscle mass.   She has agreed to :  Continue current level of physical activity   Pharmacotherapy and Medical Interventions  Adequate clinical response to anti-obesity medication, continue current regimen  Associated Conditions Impacted by Obesity Treatment  Assessment & Plan Prediabetes Most recent A1c was 5.2 and has improved significantly.  She is currently on medically supervised weight management plan has lost a significant amount of weight and is currently on Wegovy  for pharmacoprophylaxis and weight management.  She will continue current weight management strategy inclusive of GLP-1   Abnormal food appetite Improved on GLP-1.  She has increased orexigenic signaling, impaired satiety and inhibitory control. 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  In addition to nutritional and behavioral strategies she benefits from ongoing pharmacotherapy.   Class 3 severe obesity with serious comorbidity and current BMI  37 She has lost 82 pounds or 26% of total body weight loss which  surpassed this was expected with Wegovy  likely due to combination of nutritional, behavioral, physical activity and pharmacotherapy.  She has done a good job with muscle mass preservation and reductions in SAT and VAT.  Continue current weight management strategy.  Counseled on maintaining adequate hydration, protein intake between 90 to 120 g/day, fiber 25 g/day.    Objective   Physical Exam:  Blood pressure 110/73, pulse 72, temperature 97.7 F (36.5 C), height 5' 8 (1.727 m), weight 239 lb (108.4 kg), last menstrual period 08/20/2023, SpO2 97%. Body mass index is 36.34 kg/m.  General: She is overweight, cooperative, alert, well developed, and in no acute distress. PSYCH: Has normal mood, affect and thought process.   HEENT: EOMI, sclerae are anicteric. Lungs: Normal breathing effort, no conversational dyspnea. Extremities: No edema.  Neurologic: No gross sensory or motor deficits. No tremors or fasciculations noted.    Diagnostic Data Reviewed:  BMET    Component Value Date/Time   NA 139 04/16/2023 0823   K 4.8 04/16/2023 0823   CL 103 04/16/2023 0823   CO2 21 04/16/2023 0823   GLUCOSE 71 04/16/2023 0823   GLUCOSE 115 (H) 04/05/2020 1632   BUN 9 04/16/2023 0823   CREATININE 0.72 04/16/2023 0823   CALCIUM 9.6 04/16/2023 0823   GFRNONAA >60 04/05/2020 1632   GFRAA 137 10/11/2019 1040   Lab Results  Component Value Date   HGBA1C 5.2 04/16/2023   HGBA1C 5.7 (H) 11/12/2018   Lab Results  Component Value Date   INSULIN  10.4 04/16/2023   INSULIN  34.1 (H) 10/11/2019   Lab Results  Component Value Date   TSH 1.640 04/16/2023   CBC    Component Value Date/Time   WBC 8.8 04/16/2023 0823   WBC 10.9 (H) 04/05/2020 1632   RBC 5.03 04/16/2023 0823   RBC 4.61 04/05/2020 1632   HGB 13.9 04/16/2023 0823   HCT 43.5 04/16/2023 0823   PLT 336 04/16/2023 0823   MCV 87 04/16/2023 0823   MCH 27.6 04/16/2023 0823   MCH 28.2 04/05/2020 1632   MCHC 32.0 04/16/2023 0823   MCHC  33.2 04/05/2020 1632   RDW 13.2 04/16/2023 0823   Iron Studies No results found for: IRON, TIBC, FERRITIN, IRONPCTSAT Lipid Panel     Component Value Date/Time   CHOL 138 04/16/2023 0823   TRIG 103 04/16/2023 0823   HDL 39 (L) 04/16/2023 0823   CHOLHDL 4.0 11/12/2018 1508   LDLCALC 80 04/16/2023 0823   Hepatic Function Panel     Component Value Date/Time   PROT 7.4 04/16/2023 0823   ALBUMIN 4.5 04/16/2023 0823   AST 13 04/16/2023 0823   ALT 13 04/16/2023 0823   ALKPHOS 71 04/16/2023 0823   BILITOT 0.3 04/16/2023 0823      Component Value Date/Time   TSH 1.640 04/16/2023 0823   Nutritional Lab Results  Component Value Date   VD25OH 36.4 04/16/2023   VD25OH 29.2 (L) 08/12/2022   VD25OH 25.2 (L) 11/20/2021    Medications: Outpatient Encounter Medications as of 08/20/2023  Medication Sig   Cholecalciferol  (VITAMIN D3) 25 MCG (1000 UT) CAPS Take 1 capsule (1,000 Units total) by mouth daily.   fluticasone  (FLONASE ) 50 MCG/ACT nasal spray Place 2 sprays into both nostrils daily.   Multiple Vitamin (MULTIVITAMIN WITH MINERALS) TABS tablet Take 1 tablet by mouth daily.   ondansetron  (ZOFRAN ) 4 MG tablet Take 1 tablet (4 mg total)  by mouth every 8 (eight) hours as needed for nausea or vomiting.   pantoprazole  (PROTONIX ) 40 MG tablet Take 1 tablet (40 mg total) by mouth daily.   SUMAtriptan  (IMITREX ) 50 MG tablet Take 1 tablet (50 mg total) by mouth as needed for migraine. May repeat in 2 hours if headache persists or recurs. Do not exceed 2 tablets in 24 hours.   [DISCONTINUED] Semaglutide -Weight Management 2.4 MG/0.75ML SOAJ Inject 2.4 mg into the skin once a week.   Semaglutide -Weight Management 2.4 MG/0.75ML SOAJ Inject 2.4 mg into the skin once a week.   No facility-administered encounter medications on file as of 08/20/2023.     Follow-Up   Return in about 4 weeks (around 09/17/2023) for For Weight Mangement with Dr. Francyne.SABRA She was informed of the importance of  frequent follow up visits to maximize her success with intensive lifestyle modifications for her multiple health conditions.  Attestation Statement   Reviewed by clinician on day of visit: allergies, medications, problem list, medical history, surgical history, family history, social history, and previous encounter notes.     Lucas Francyne, MD

## 2023-09-06 ENCOUNTER — Other Ambulatory Visit: Payer: Self-pay

## 2023-09-08 ENCOUNTER — Other Ambulatory Visit (HOSPITAL_COMMUNITY): Payer: Self-pay

## 2023-09-17 ENCOUNTER — Encounter (INDEPENDENT_AMBULATORY_CARE_PROVIDER_SITE_OTHER): Payer: Self-pay | Admitting: Internal Medicine

## 2023-09-17 ENCOUNTER — Ambulatory Visit (INDEPENDENT_AMBULATORY_CARE_PROVIDER_SITE_OTHER): Admitting: Internal Medicine

## 2023-09-17 ENCOUNTER — Other Ambulatory Visit (HOSPITAL_COMMUNITY): Payer: Self-pay

## 2023-09-17 VITALS — BP 127/79 | HR 66 | Temp 98.2°F | Ht 68.0 in | Wt 242.0 lb

## 2023-09-17 DIAGNOSIS — F5089 Other specified eating disorder: Secondary | ICD-10-CM | POA: Diagnosis not present

## 2023-09-17 DIAGNOSIS — E66813 Obesity, class 3: Secondary | ICD-10-CM

## 2023-09-17 DIAGNOSIS — R7303 Prediabetes: Secondary | ICD-10-CM

## 2023-09-17 DIAGNOSIS — F509 Eating disorder, unspecified: Secondary | ICD-10-CM | POA: Insufficient documentation

## 2023-09-17 DIAGNOSIS — Z6837 Body mass index (BMI) 37.0-37.9, adult: Secondary | ICD-10-CM

## 2023-09-17 DIAGNOSIS — R638 Other symptoms and signs concerning food and fluid intake: Secondary | ICD-10-CM

## 2023-09-17 MED ORDER — SEMAGLUTIDE-WEIGHT MANAGEMENT 2.4 MG/0.75ML ~~LOC~~ SOAJ
2.4000 mg | SUBCUTANEOUS | 1 refills | Status: DC
  Filled 2023-09-17 – 2023-10-05 (×2): qty 3, 28d supply, fill #0

## 2023-09-17 NOTE — Assessment & Plan Note (Signed)
 She has lost 82 pounds or 26% of total body weight loss which surpassed this was expected with Wegovy  likely due to combination of nutritional, behavioral, physical activity and pharmacotherapy.  She has done a good job with muscle mass preservation and reductions in SAT and VAT.  Continue current weight management strategy.  Has recently increased carbohydrate intake which has likely resulted in a slowing of her weight loss there is also some emotional eating due to stress.  Patient received counseling today on this.

## 2023-09-17 NOTE — Progress Notes (Deleted)
 Office: [phone]  /  Fax: [phone]  Weight Summary and Body Composition Analysis (BIA)  Vitals Temp: 98.2 F (36.8 C) BP: 127/79 Pulse Rate: 66 SpO2: 97 %   Anthropometric Measurements Height: 5' 8 (1.727 m) Weight: 242 lb (109.8 kg) BMI (Calculated): 36.8 Weight at Last Visit: 239 lb Weight Lost Since Last Visit: 0 lb Weight Gained Since Last Visit: 3 lb Starting Weight: 321 lb Total Weight Loss (lbs): 79 lb (35.8 kg)   Body Composition  Body Fat %: 40.6 % Fat Mass (lbs): 98.6 lbs Muscle Mass (lbs): 137 lbs Total Body Water (lbs): 92 lbs Visceral Fat Rating : 9    RMR: 2121  Today's Visit #: 52  Starting Date: 10/11/22   Subjective   Chief Complaint: Obesity  Interval History ***  Challenges affecting patient progress: {EMOBESITYBARRIERS:28841::none}.    Pharmacotherapy for weight management: She is currently taking {EMPharmaco:28845}.   Assessment and Plan   Treatment Plan For Obesity:  Recommended Dietary Goals  Lynnetta is currently in the action stage of change. As such, her goal is to continue weight management plan. She has agreed to: {EMWTLOSSPLAN:29297::continue current plan}  Behavioral Health and Counseling  We discussed the following behavioral modification strategies today: {EMWMwtlossstrategies:28914::continue to work on maintaining a reduced calorie state, getting the recommended amount of protein, incorporating whole foods, making healthy choices, staying well hydrated and practicing mindfulness when eating.}.  Additional education and resources provided today: {EMadditionalresources:29169::None}  Recommended Physical Activity Goals  Charise has been advised to work up to 150 minutes of moderate intensity aerobic activity a week and strengthening exercises 2-3 times per week for cardiovascular health, weight loss maintenance and preservation of muscle mass.   She has agreed to :  {EMEXERCISE:28847::Think  about enjoyable ways to increase daily physical activity and overcoming barriers to exercise,Increase physical activity in their day and reduce sedentary time (increase NEAT).}  Medical Interventions and Pharmacotherapy  We discussed various medication options to help Encarnacion with her weight loss efforts and we both agreed to : {EMagreedrx:29170}  Associated Conditions Impacted by Obesity Treatment  Assessment & Plan Class 3 severe obesity with serious comorbidity and current BMI 37     ***  Objective   Physical Exam:  Blood pressure 127/79, pulse 66, temperature 98.2 F (36.8 C), height 5' 8 (1.727 m), weight 242 lb (109.8 kg), last menstrual period 09/12/2023, SpO2 97%. Body mass index is 36.8 kg/m.  General: She is overweight, cooperative, alert, well developed, and in no acute distress. PSYCH: Has normal mood, affect and thought process.   HEENT: EOMI, sclerae are anicteric. Lungs: Normal breathing effort, no conversational dyspnea. Extremities: No edema.  Neurologic: No gross sensory or motor deficits. No tremors or fasciculations noted.    Diagnostic Data Reviewed:  BMET    Component Value Date/Time   NA 139 04/16/2023 0823   K 4.8 04/16/2023 0823   CL 103 04/16/2023 0823   CO2 21 04/16/2023 0823   GLUCOSE 71 04/16/2023 0823   GLUCOSE 115 (H) 04/05/2020 1632   BUN 9 04/16/2023 0823   CREATININE 0.72 04/16/2023 0823   CALCIUM 9.6 04/16/2023 0823   GFRNONAA >60 04/05/2020 1632   GFRAA 137 10/11/2019 1040   Lab Results  Component Value Date   HGBA1C 5.2 04/16/2023   HGBA1C 5.7 (H) 11/12/2018   Lab Results  Component Value Date   INSULIN  10.4 04/16/2023   INSULIN  34.1 (H) 10/11/2019   Lab Results  Component Value Date   TSH 1.640 04/16/2023  CBC    Component Value Date/Time   WBC 8.8 04/16/2023 0823   WBC 10.9 (H) 04/05/2020 1632   RBC 5.03 04/16/2023 0823   RBC 4.61 04/05/2020 1632   HGB 13.9 04/16/2023 0823   HCT 43.5 04/16/2023 0823    PLT 336 04/16/2023 0823   MCV 87 04/16/2023 0823   MCH 27.6 04/16/2023 0823   MCH 28.2 04/05/2020 1632   MCHC 32.0 04/16/2023 0823   MCHC 33.2 04/05/2020 1632   RDW 13.2 04/16/2023 0823   Iron Studies No results found for: IRON, TIBC, FERRITIN, IRONPCTSAT Lipid Panel     Component Value Date/Time   CHOL 138 04/16/2023 0823   TRIG 103 04/16/2023 0823   HDL 39 (L) 04/16/2023 0823   CHOLHDL 4.0 11/12/2018 1508   LDLCALC 80 04/16/2023 0823   Hepatic Function Panel     Component Value Date/Time   PROT 7.4 04/16/2023 0823   ALBUMIN 4.5 04/16/2023 0823   AST 13 04/16/2023 0823   ALT 13 04/16/2023 0823   ALKPHOS 71 04/16/2023 0823   BILITOT 0.3 04/16/2023 0823      Component Value Date/Time   TSH 1.640 04/16/2023 0823   Nutritional Lab Results  Component Value Date   VD25OH 36.4 04/16/2023   VD25OH 29.2 (L) 08/12/2022   VD25OH 25.2 (L) 11/20/2021    Medications: Outpatient Encounter Medications as of 09/17/2023  Medication Sig   Cholecalciferol  (VITAMIN D3) 25 MCG (1000 UT) CAPS Take 1 capsule (1,000 Units total) by mouth daily.   fluticasone  (FLONASE ) 50 MCG/ACT nasal spray Place 2 sprays into both nostrils daily.   Multiple Vitamin (MULTIVITAMIN WITH MINERALS) TABS tablet Take 1 tablet by mouth daily.   ondansetron  (ZOFRAN ) 4 MG tablet Take 1 tablet (4 mg total) by mouth every 8 (eight) hours as needed for nausea or vomiting.   pantoprazole  (PROTONIX ) 40 MG tablet Take 1 tablet (40 mg total) by mouth daily.   Semaglutide -Weight Management 2.4 MG/0.75ML SOAJ Inject 2.4 mg into the skin once a week.   SUMAtriptan  (IMITREX ) 50 MG tablet Take 1 tablet (50 mg total) by mouth as needed for migraine. May repeat in 2 hours if headache persists or recurs. Do not exceed 2 tablets in 24 hours.   No facility-administered encounter medications on file as of 09/17/2023.     Follow-Up   No follow-ups on file.Cindy Dixon She was informed of the importance of frequent follow up visits  to maximize her success with intensive lifestyle modifications for her multiple health conditions.  Attestation Statement   Reviewed by clinician on day of visit: allergies, medications, problem list, medical history, surgical history, family history, social history, and previous encounter notes.     Lucas Parker, MD

## 2023-09-17 NOTE — Assessment & Plan Note (Signed)
 As above

## 2023-09-17 NOTE — Assessment & Plan Note (Signed)
 Most recent A1c was 5.2 and has improved significantly.  She is currently on medically supervised weight management plan has lost a significant amount of weight and is currently on Wegovy for pharmacoprophylaxis and weight management.  She will continue current weight management strategy inclusive of GLP-1

## 2023-09-17 NOTE — Assessment & Plan Note (Signed)
 Improved on GLP-1.  She has increased orexigenic signaling, impaired satiety and inhibitory control. 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  In addition to nutritional and behavioral strategies she benefits from ongoing pharmacotherapy.  Patient also counseled on addressing emotional eating through none food ways

## 2023-09-17 NOTE — Progress Notes (Signed)
 Office: [phone]  /  Fax: [phone]  Weight Summary And Body Composition Analysis (BIA)  Vitals Temp: 98.2 F (36.8 C) BP: 127/79 Pulse Rate: 66 SpO2: 97 %   Anthropometric Measurements Height: 5' 8 (1.727 m) Weight: 242 lb (109.8 kg) BMI (Calculated): 36.8 Weight at Last Visit: 239 lb Weight Lost Since Last Visit: 0 lb Weight Gained Since Last Visit: 3 lb Starting Weight: 321 lb Total Weight Loss (lbs): 79 lb (35.8 kg)   Body Composition  Body Fat %: 40.6 % Fat Mass (lbs): 98.6 lbs Muscle Mass (lbs): 137 lbs Total Body Water (lbs): 92 lbs Visceral Fat Rating : 9    RMR: 2121  Today's Visit #: 52  Starting Date: 10/11/22   Subjective   Chief Complaint: Obesity  Cindy Dixon is here to discuss her progress with her obesity treatment plan. She is following the Category 3 plan - 1500 kcal per day and states she is following her eating plan approximately 70-80% of the time. She states she is exercising 30 minutes 5 times per week..  Weight Progress Since Last Visit:  Since last office visit she has gained 3 pounds.  Bioimpedance suggest some gain and muscle She reports had recent lapse due to family or work She has been working on not skipping meals, increasing protein intake at every meal, drinking more water, making healthier choices, reducing portion sizes, getting back on track following recent lapse, incorporating more whole foods, and has noticed some emotional eating due stress from her father being fired without because after having a toe amputation for work-related injury   Nutritional 24 HR Recall: Her intake has increased in carbs due to cookouts  Challenges affecting patient progress: moderate to high levels of stress and emotional eating.   Orexigenic Control: Denies problems with appetite and hunger signals.  Denies problems with satiety and satiation.  Denies problems with eating patterns and portion control.  Reports abnormal  cravings. Denies feeling deprived or restricted.   Pharmacotherapy for weight management: She is currently taking Wegovy  with adequate clinical response  and without side effects..   Assessment and Plan   Treatment Plan For Obesity:  Recommended Dietary Goals  Cindy Dixon is currently in the action stage of change. As such, her goal is to continue weight management plan. She has agreed to: continue current plan  Behavioral Health and Counseling  We discussed the following behavioral modification strategies today: practice mindfulness eating and understand the difference between hunger signals and cravings, avoiding temptations and identifying enticing environmental cues, and continue to practice mindfulness when eating.  Additional education and resources provided today: None  Recommended Physical Activity Goals  Cindy Dixon has been advised to work up to 150 minutes of moderate intensity aerobic activity a week and strengthening exercises 2-3 times per week for cardiovascular health, weight loss maintenance and preservation of muscle mass.   She has agreed to :  continue to gradually increase the amount and intensity of exercise routine  Pharmacotherapy and Medical Interventions  Adequate clinical response to anti-obesity medication, continue current regimen  Associated Conditions Impacted by Obesity Treatment  Assessment & Plan Class 3 severe obesity with serious comorbidity and current BMI 37 She has lost 82 pounds or 26% of total body weight loss which surpassed this was expected with Wegovy  likely due to combination of nutritional, behavioral, physical activity and pharmacotherapy.  She has done a good job with muscle mass preservation and reductions in SAT and VAT.  Continue current weight management strategy.  Has  recently increased carbohydrate intake which has likely resulted in a slowing of her weight loss there is also some emotional eating due to stress.  Patient  received counseling today on this. Prediabetes Most recent A1c was 5.2 and has improved significantly.  She is currently on medically supervised weight management plan has lost a significant amount of weight and is currently on Wegovy  for pharmacoprophylaxis and weight management.  She will continue current weight management strategy inclusive of GLP-1   Abnormal food appetite Improved on GLP-1.  She has increased orexigenic signaling, impaired satiety and inhibitory control. 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  In addition to nutritional and behavioral strategies she benefits from ongoing pharmacotherapy.  Patient also counseled on addressing emotional eating through none food ways  Eating disorder, unspecified type -emotional eating triggered by stress As above    Objective   Physical Exam:  Blood pressure 127/79, pulse 66, temperature 98.2 F (36.8 C), height 5' 8 (1.727 m), weight 242 lb (109.8 kg), last menstrual period 09/12/2023, SpO2 97%. Body mass index is 36.8 kg/m.  General: She is overweight, cooperative, alert, well developed, and in no acute distress. PSYCH: Has normal mood, affect and thought process.   HEENT: EOMI, sclerae are anicteric. Lungs: Normal breathing effort, no conversational dyspnea. Extremities: No edema.  Neurologic: No gross sensory or motor deficits. No tremors or fasciculations noted.    Diagnostic Data Reviewed:  BMET    Component Value Date/Time   NA 139 04/16/2023 0823   K 4.8 04/16/2023 0823   CL 103 04/16/2023 0823   CO2 21 04/16/2023 0823   GLUCOSE 71 04/16/2023 0823   GLUCOSE 115 (H) 04/05/2020 1632   BUN 9 04/16/2023 0823   CREATININE 0.72 04/16/2023 0823   CALCIUM 9.6 04/16/2023 0823   GFRNONAA >60 04/05/2020 1632   GFRAA 137 10/11/2019 1040   Lab Results  Component Value Date   HGBA1C 5.2 04/16/2023   HGBA1C 5.7 (H) 11/12/2018    Lab Results  Component Value Date   INSULIN  10.4 04/16/2023   INSULIN  34.1 (H) 10/11/2019   Lab Results  Component Value Date   TSH 1.640 04/16/2023   CBC    Component Value Date/Time   WBC 8.8 04/16/2023 0823   WBC 10.9 (H) 04/05/2020 1632   RBC 5.03 04/16/2023 0823   RBC 4.61 04/05/2020 1632   HGB 13.9 04/16/2023 0823   HCT 43.5 04/16/2023 0823   PLT 336 04/16/2023 0823   MCV 87 04/16/2023 0823   MCH 27.6 04/16/2023 0823   MCH 28.2 04/05/2020 1632   MCHC 32.0 04/16/2023 0823   MCHC 33.2 04/05/2020 1632   RDW 13.2 04/16/2023 0823   Iron Studies No results found for: IRON, TIBC, FERRITIN, IRONPCTSAT Lipid Panel     Component Value Date/Time   CHOL 138 04/16/2023 0823   TRIG 103 04/16/2023 0823   HDL 39 (L) 04/16/2023 0823   CHOLHDL 4.0 11/12/2018 1508   LDLCALC 80 04/16/2023 0823   Hepatic Function Panel     Component Value Date/Time   PROT 7.4 04/16/2023 0823   ALBUMIN 4.5 04/16/2023 0823   AST 13 04/16/2023 0823   ALT 13 04/16/2023 0823   ALKPHOS 71 04/16/2023 0823   BILITOT 0.3 04/16/2023 0823      Component Value Date/Time   TSH 1.640 04/16/2023 0823   Nutritional Lab Results  Component Value Date   VD25OH 36.4 04/16/2023   VD25OH 29.2 (L)  08/12/2022   VD25OH 25.2 (L) 11/20/2021    Medications: Outpatient Encounter Medications as of 09/17/2023  Medication Sig   Cholecalciferol  (VITAMIN D3) 25 MCG (1000 UT) CAPS Take 1 capsule (1,000 Units total) by mouth daily.   fluticasone  (FLONASE ) 50 MCG/ACT nasal spray Place 2 sprays into both nostrils daily.   Multiple Vitamin (MULTIVITAMIN WITH MINERALS) TABS tablet Take 1 tablet by mouth daily.   ondansetron  (ZOFRAN ) 4 MG tablet Take 1 tablet (4 mg total) by mouth every 8 (eight) hours as needed for nausea or vomiting.   pantoprazole  (PROTONIX ) 40 MG tablet Take 1 tablet (40 mg total) by mouth daily.   semaglutide -weight management (WEGOVY ) 2.4 MG/0.75ML SOAJ SQ injection Inject 2.4 mg into the  skin once a week.   SUMAtriptan  (IMITREX ) 50 MG tablet Take 1 tablet (50 mg total) by mouth as needed for migraine. May repeat in 2 hours if headache persists or recurs. Do not exceed 2 tablets in 24 hours.   [DISCONTINUED] Semaglutide -Weight Management 2.4 MG/0.75ML SOAJ Inject 2.4 mg into the skin once a week.   No facility-administered encounter medications on file as of 09/17/2023.     Follow-Up   Return in about 4 weeks (around 10/15/2023) for For Weight Mangement with Dr. Francyne.[email or web address] She was informed of the importance of frequent follow up visits to maximize her success with intensive lifestyle modifications for her multiple health conditions.  Attestation Statement   Reviewed by clinician on day of visit: allergies, medications, problem list, medical history, surgical history, family history, social history, and previous encounter notes.     Lucas Francyne, MD

## 2023-10-05 ENCOUNTER — Other Ambulatory Visit (HOSPITAL_COMMUNITY): Payer: Self-pay

## 2023-10-06 ENCOUNTER — Other Ambulatory Visit (HOSPITAL_COMMUNITY): Payer: Self-pay

## 2023-10-23 ENCOUNTER — Ambulatory Visit (INDEPENDENT_AMBULATORY_CARE_PROVIDER_SITE_OTHER): Admitting: Internal Medicine

## 2023-10-23 ENCOUNTER — Encounter (INDEPENDENT_AMBULATORY_CARE_PROVIDER_SITE_OTHER): Payer: Self-pay | Admitting: Internal Medicine

## 2023-10-23 ENCOUNTER — Other Ambulatory Visit (HOSPITAL_COMMUNITY): Payer: Self-pay

## 2023-10-23 VITALS — BP 116/78 | HR 63 | Temp 98.2°F | Ht 68.0 in | Wt 245.0 lb

## 2023-10-23 DIAGNOSIS — Z6841 Body Mass Index (BMI) 40.0 and over, adult: Secondary | ICD-10-CM

## 2023-10-23 DIAGNOSIS — E66813 Obesity, class 3: Secondary | ICD-10-CM | POA: Diagnosis not present

## 2023-10-23 DIAGNOSIS — Z6837 Body mass index (BMI) 37.0-37.9, adult: Secondary | ICD-10-CM | POA: Diagnosis not present

## 2023-10-23 DIAGNOSIS — R7303 Prediabetes: Secondary | ICD-10-CM | POA: Diagnosis not present

## 2023-10-23 DIAGNOSIS — K219 Gastro-esophageal reflux disease without esophagitis: Secondary | ICD-10-CM | POA: Diagnosis not present

## 2023-10-23 MED ORDER — METFORMIN HCL ER 500 MG PO TB24
500.0000 mg | ORAL_TABLET | Freq: Every day | ORAL | 0 refills | Status: DC
  Filled 2023-10-23: qty 90, 90d supply, fill #0

## 2023-10-23 MED ORDER — SEMAGLUTIDE-WEIGHT MANAGEMENT 2.4 MG/0.75ML ~~LOC~~ SOAJ
2.4000 mg | SUBCUTANEOUS | 2 refills | Status: DC
  Filled 2023-10-23 – 2023-10-31 (×3): qty 3, 28d supply, fill #0

## 2023-10-23 NOTE — Assessment & Plan Note (Signed)
 [name]'s last HbA1c was 5.1%, indicating good control. She is interested in restarting metformin  to enhance Wegovy 's effects. - Restart metformin  once daily with food for 90 days. - Continue Wegovy  at current dose -Continue to maintain a diet low in simple and added sugars. -Work on increasing volume of physical activity to 240 minutes a week doing combined endurance and strengthening.

## 2023-10-23 NOTE — Progress Notes (Signed)
 Office: [phone]  /  Fax: [phone]  Weight Summary and Body Composition Analysis (BIA)  Vitals Temp: 98.2 F (36.8 C) BP: 116/78 Pulse Rate: 63 SpO2: 97 %   Anthropometric Measurements Height: 5' 8 (1.727 m) Weight: 245 lb (111.1 kg) BMI (Calculated): 37.26 Weight at Last Visit: 242 lb Weight Lost Since Last Visit: 0 lb Weight Gained Since Last Visit: 3 lb Starting Weight: 321 lb Total Weight Loss (lbs): 76 lb (34.5 kg)   Body Composition  Body Fat %: 40.2 % Fat Mass (lbs): 98.8 lbs Muscle Mass (lbs): 139.6 lbs Total Body Water (lbs): 93.4 lbs Visceral Fat Rating : 9    RMR: 2121  Today's Visit #: 42  Starting Date: 10/11/19   Subjective   Chief Complaint: Obesity  Interval History Discussed the use of AI scribe software for clinical note transcription with the patient, who gave verbal consent to proceed.  History of Present Illness Cindy Dixon is a 33 year old female who presents for medical weight management.  She is following a 1500-calorie nutrition plan with fair adherence.  Since last office visit she has gained 3 pounds bioimpedance information suggests some fluid retention and increase in muscle mass versus fat mass.  She has been busy preparing for her daughter's quinceaera, which has impacted her ability to maintain her exercise routine. She continues to walk regularly but has reduced weightlifting to twice a week due to her busy schedule.  She feels 'inflamada' and attributes it to her menstrual cycle, which is due soon. She has not experienced any issues with her current medication, Wegovy , and her appetite is well-controlled. Occasionally, she feels the urge to eat more but manages to control it herself.  She has previously taken metformin  twice daily and is open to restarting it once daily with food to enhance the effects of Wegovy . She recently returned from a vacation in [place], which may have contributed to a slight weight  gain.  She has stopped taking Protonix  as she no longer experiences acid reflux or heartburn. Her last laboratory tests in March showed a diabetes test result of 5.1.  She is interested in taking collagen supplements for skin and bone health and has been exploring options available at local stores. She is also considering future cosmetic surgery to address skin laxity due to significant weight loss, but she plans to wait until she loses an additional 20 pounds.     Challenges affecting patient progress: metabolic adaptations associated with weight loss.    Pharmacotherapy for weight management: She is currently taking Wegovy  with adequate clinical response  and without side effects..   Assessment and Plan   Treatment Plan For Obesity:  Recommended Dietary Goals  Cindy Dixon is currently in the action stage of change. As such, her goal is to continue weight management plan. She has agreed to: continue current plan  Behavioral Health and Counseling  We discussed the following behavioral modification strategies today: continue to work on maintaining a reduced calorie state, getting the recommended amount of protein, incorporating whole foods, making healthy choices, staying well hydrated and practicing mindfulness when eating. and increase protein intake, fibrous foods (25 grams per day for women, 30 grams for men) and water to improve satiety and decrease hunger signals. .  Additional education and resources provided today: None  Recommended Physical Activity Goals  Cindy Dixon has been advised to work up to 150 minutes of moderate intensity aerobic activity a week and strengthening exercises 2-3 times per week for cardiovascular  health, weight loss maintenance and preservation of muscle mass.  She has agreed to :  Increase volume of physical activity to a goal of 240 minutes a week and Combine aerobic and strengthening exercises for efficiency and improved cardiometabolic  health.  Medical Interventions and Pharmacotherapy  We discussed various medication options to help Cindy Dixon with her weight loss efforts and we both agreed to : Adequate clinical response to anti-obesity medication, continue current regimen and she is probably reaching medication plateau which has been on Wegovy  for over a year.  We will start metformin  500 mg XR once a day for synergy.  I would like to repeat indirect calorimetry to see if there is any metabolic slowing associated with her weight loss.  Associated Conditions Impacted by Obesity Treatment  Assessment & Plan Prediabetes Cindy Dixon's last HbA1c was 5.1%, indicating good control. She is interested in restarting metformin  to enhance Wegovy 's effects. - Restart metformin  once daily with food for 90 days. - Continue Wegovy  at current dose -Continue to maintain a diet low in simple and added sugars. -Work on increasing volume of physical activity to 240 minutes a week doing combined endurance and strengthening. Class 3 severe obesity with serious comorbidity and current BMI 37 She has lost 82 pounds or 26% of total body weight loss which surpassed this was expected with Wegovy  likely due to combination of nutritional, behavioral, physical activity and pharmacotherapy.  She may be entering a weight loss plateau. Her daughter's upcoming Cindy Dixon has disrupted her exercise routine, leading to muscle gain and water retention, possibly due to her menstrual cycle. She plans to resume weightlifting post-event. Discussed potential skin laxity from weight loss and future cosmetic surgery, which insurance may not cover unless complications arise. - Continue Wegovy  with refills provided. - Resume metformin  once daily with food for 90 days. - Encourage resumption of weightlifting post-quinceaera. - Discuss collagen supplementation options for skin and joint health. Continue current weight management strategy.  Has recently increased  carbohydrate intake which has likely resulted in a slowing of her weight loss there is also some emotional eating due to stress.  Patient received counseling today on this. - Repeat indirect calorimetry in a fasting state next office visit to assess for metabolic slowing. -Significant improvements in quality of life including better sleep, energy levels as well as self-confidence. Gastroesophageal reflux disease, unspecified whether esophagitis present Her acid reflux has improved significantly she is no longer requiring PPI.  Continue monitoring for now         Objective   Physical Exam:  Blood pressure 116/78, pulse 63, temperature 98.2 F (36.8 C), height 5' 8 (1.727 m), weight 245 lb (111.1 kg), last menstrual period 09/22/2023, SpO2 97%. Body mass index is 37.25 kg/m.  General: She is overweight, cooperative, alert, well developed, and in no acute distress. PSYCH: Has normal mood, affect and thought process.   HEENT: EOMI, sclerae are anicteric. Lungs: Normal breathing effort, no conversational dyspnea. Extremities: No edema.  Neurologic: No gross sensory or motor deficits. No tremors or fasciculations noted.    Diagnostic Data Reviewed:  BMET    Component Value Date/Time   NA 139 04/16/2023 0823   K 4.8 04/16/2023 0823   CL 103 04/16/2023 0823   CO2 21 04/16/2023 0823   GLUCOSE 71 04/16/2023 0823   GLUCOSE 115 (H) 04/05/2020 1632   BUN 9 04/16/2023 0823   CREATININE 0.72 04/16/2023 0823   CALCIUM 9.6 04/16/2023 0823   GFRNONAA >60 04/05/2020 1632   GFRAA 137  10/11/2019 1040   Lab Results  Component Value Date   HGBA1C 5.2 04/16/2023   HGBA1C 5.7 (H) 11/12/2018   Lab Results  Component Value Date   INSULIN  10.4 04/16/2023   INSULIN  34.1 (H) 10/11/2019   Lab Results  Component Value Date   TSH 1.640 04/16/2023   CBC    Component Value Date/Time   WBC 8.8 04/16/2023 0823   WBC 10.9 (H) 04/05/2020 1632   RBC 5.03 04/16/2023 0823   RBC 4.61 04/05/2020  1632   HGB 13.9 04/16/2023 0823   HCT 43.5 04/16/2023 0823   PLT 336 04/16/2023 0823   MCV 87 04/16/2023 0823   MCH 27.6 04/16/2023 0823   MCH 28.2 04/05/2020 1632   MCHC 32.0 04/16/2023 0823   MCHC 33.2 04/05/2020 1632   RDW 13.2 04/16/2023 0823   Iron Studies No results found for: IRON, TIBC, FERRITIN, IRONPCTSAT Lipid Panel     Component Value Date/Time   CHOL 138 04/16/2023 0823   TRIG 103 04/16/2023 0823   HDL 39 (L) 04/16/2023 0823   CHOLHDL 4.0 11/12/2018 1508   LDLCALC 80 04/16/2023 0823   Hepatic Function Panel     Component Value Date/Time   PROT 7.4 04/16/2023 0823   ALBUMIN 4.5 04/16/2023 0823   AST 13 04/16/2023 0823   ALT 13 04/16/2023 0823   ALKPHOS 71 04/16/2023 0823   BILITOT 0.3 04/16/2023 0823      Component Value Date/Time   TSH 1.640 04/16/2023 0823   Nutritional Lab Results  Component Value Date   VD25OH 36.4 04/16/2023   VD25OH 29.2 (L) 08/12/2022   VD25OH 25.2 (L) 11/20/2021    Medications: Outpatient Encounter Medications as of 10/23/2023  Medication Sig   Cholecalciferol  (VITAMIN D3) 25 MCG (1000 UT) CAPS Take 1 capsule (1,000 Units total) by mouth daily.   fluticasone  (FLONASE ) 50 MCG/ACT nasal spray Place 2 sprays into both nostrils daily.   metFORMIN  (GLUCOPHAGE -XR) 500 MG 24 hr tablet Take 1 tablet (500 mg total) by mouth daily with breakfast.   Multiple Vitamin (MULTIVITAMIN WITH MINERALS) TABS tablet Take 1 tablet by mouth daily.   ondansetron  (ZOFRAN ) 4 MG tablet Take 1 tablet (4 mg total) by mouth every 8 (eight) hours as needed for nausea or vomiting.   pantoprazole  (PROTONIX ) 40 MG tablet Take 1 tablet (40 mg total) by mouth daily.   SUMAtriptan  (IMITREX ) 50 MG tablet Take 1 tablet (50 mg total) by mouth as needed for migraine. May repeat in 2 hours if headache persists or recurs. Do not exceed 2 tablets in 24 hours.   [DISCONTINUED] semaglutide -weight management (WEGOVY ) 2.4 MG/0.75ML SOAJ SQ injection Inject 2.4 mg  into the skin once a week.   semaglutide -weight management (WEGOVY ) 2.4 MG/0.75ML SOAJ SQ injection Inject 2.4 mg into the skin once a week.   No facility-administered encounter medications on file as of 10/23/2023.     Follow-Up   Return in about 4 weeks (around 11/20/2023) for For Weight Mangement with Dr. Francyne, Fasting and 30 minutes early for IC, 40 minutes.. She was informed of the importance of frequent follow up visits to maximize her success with intensive lifestyle modifications for her multiple health conditions.  Attestation Statement   Reviewed by clinician on day of visit: allergies, medications, problem list, medical history, surgical history, family history, social history, and previous encounter notes.     Lucas Francyne, MD

## 2023-10-23 NOTE — Assessment & Plan Note (Signed)
 Her acid reflux has improved significantly she is no longer requiring PPI.  Continue monitoring for now

## 2023-10-23 NOTE — Assessment & Plan Note (Signed)
 She has lost 82 pounds or 26% of total body weight loss which surpassed this was expected with Wegovy  likely due to combination of nutritional, behavioral, physical activity and pharmacotherapy.  She may be entering a weight loss plateau. Her daughter's upcoming sheila has disrupted her exercise routine, leading to muscle gain and water retention, possibly due to her menstrual cycle. She plans to resume weightlifting post-event. Discussed potential skin laxity from weight loss and future cosmetic surgery, which insurance may not cover unless complications arise. - Continue Wegovy  with refills provided. - Resume metformin  once daily with food for 90 days. - Encourage resumption of weightlifting post-quinceaera. - Discuss collagen supplementation options for skin and joint health. Continue current weight management strategy.  Has recently increased carbohydrate intake which has likely resulted in a slowing of her weight loss there is also some emotional eating due to stress.  Patient received counseling today on this. - Repeat indirect calorimetry in a fasting state next office visit to assess for metabolic slowing. -Significant improvements in quality of life including better sleep, energy levels as well as self-confidence.

## 2023-10-27 ENCOUNTER — Other Ambulatory Visit (HOSPITAL_COMMUNITY): Payer: Self-pay

## 2023-10-31 ENCOUNTER — Other Ambulatory Visit (HOSPITAL_COMMUNITY): Payer: Self-pay

## 2023-11-24 ENCOUNTER — Encounter (INDEPENDENT_AMBULATORY_CARE_PROVIDER_SITE_OTHER): Payer: Self-pay | Admitting: Internal Medicine

## 2023-11-24 ENCOUNTER — Encounter (HOSPITAL_COMMUNITY): Payer: Self-pay

## 2023-11-24 ENCOUNTER — Ambulatory Visit (INDEPENDENT_AMBULATORY_CARE_PROVIDER_SITE_OTHER): Admitting: Internal Medicine

## 2023-11-24 ENCOUNTER — Other Ambulatory Visit (HOSPITAL_COMMUNITY): Payer: Self-pay

## 2023-11-24 VITALS — BP 123/80 | HR 67 | Temp 98.1°F | Ht 68.0 in | Wt 245.0 lb

## 2023-11-24 DIAGNOSIS — R7303 Prediabetes: Secondary | ICD-10-CM

## 2023-11-24 DIAGNOSIS — Z6837 Body mass index (BMI) 37.0-37.9, adult: Secondary | ICD-10-CM | POA: Diagnosis not present

## 2023-11-24 DIAGNOSIS — E66812 Obesity, class 2: Secondary | ICD-10-CM

## 2023-11-24 DIAGNOSIS — R638 Other symptoms and signs concerning food and fluid intake: Secondary | ICD-10-CM

## 2023-11-24 MED ORDER — METFORMIN HCL ER 500 MG PO TB24
500.0000 mg | ORAL_TABLET | Freq: Every day | ORAL | 0 refills | Status: DC
Start: 2023-11-24 — End: 2023-12-22
  Filled 2023-11-24: qty 90, 90d supply, fill #0

## 2023-11-24 MED ORDER — SEMAGLUTIDE-WEIGHT MANAGEMENT 2.4 MG/0.75ML ~~LOC~~ SOAJ
2.4000 mg | SUBCUTANEOUS | 2 refills | Status: AC
  Filled 2023-11-24 – 2024-01-11 (×4): qty 3, 28d supply, fill #0

## 2023-11-24 NOTE — Assessment & Plan Note (Signed)
 Cindy Dixon's last HbA1c was 5.1%, indicating good control.  She is currently on Wegovy  and metformin  for pharmacoprophylaxis without any adverse effects and will continue medication.

## 2023-11-24 NOTE — Progress Notes (Signed)
 Office: [phone]  /  Fax: [phone]  Weight Summary and Body Composition Analysis (BIA)  Vitals Temp: 98.1 F (36.7 C) BP: 123/80 Pulse Rate: 67 SpO2: 98 %   Anthropometric Measurements Height: 5' 8 (1.727 m) Weight: 245 lb (111.1 kg) BMI (Calculated): 37.26 Weight at Last Visit: 245 lb Weight Lost Since Last Visit: 0 lb Weight Gained Since Last Visit: 0 lb Starting Weight: 321 lb Total Weight Loss (lbs): 76 lb (34.5 kg)   Body Composition  Body Fat %: 39.7 % Fat Mass (lbs): 97.2 lbs Muscle Mass (lbs): 140.4 lbs Total Body Water (lbs): 94.8 lbs Visceral Fat Rating : 9    RMR: 2121  Today's Visit #: 54  Starting Date: 10/11/19   Subjective   Chief Complaint: Obesity  Interval History Discussed the use of AI scribe software for clinical note transcription with the patient, who gave verbal consent to proceed.  History of Present Illness Cindy Dixon is a 33 year old female with prediabetes who presents for medical weight management.  She has maintained her weight since the last office visit and adheres to a 1500 calorie nutrition plan 75% of the time. She tracks her food intake, consumes more whole foods, meets the recommended protein intake, and maintains adequate hydration. She exercises five days a week, primarily cardio, for 15 minutes each session. Despite these efforts, she has not noticed significant weight loss.  She has a history of prediabetes and abnormal food appetite. She is currently on Wegovy  2.4 mg once a week and was previously started on metformin . She feels full quickly and believes she has reduced her portion sizes.  She recently hosted a large family gathering with over 120 attendees, including relatives from [place], [place], and local areas. This event was stressful, and she did not exercise during this period.     Challenges affecting patient progress: metabolic adaptations associated with weight loss.     Pharmacotherapy for weight management: She is currently taking Metformin  (off label use for weight management and / or insulin  resistance and / or diabetes prevention) with adequate clinical response  and without side effects. and Wegovy  with adequate clinical response , without side effects., and she has been on medication for over 60 weeks and may have reached maximal benefit.   Assessment and Plan   Treatment Plan For Obesity:  Recommended Dietary Goals  Cindy Dixon is currently in the action stage of change. As such, her goal is to continue weight management plan. She has agreed to: follow the Category 3 plan - 1500 kcal per day and we will repeat indirect calorimetry at the next visit  Behavioral Health and Counseling  We discussed the following behavioral modification strategies today: continue to work on maintaining a reduced calorie state, getting the recommended amount of protein, incorporating whole foods, making healthy choices, staying well hydrated and practicing mindfulness when eating. and increase protein intake, fibrous foods (25 grams per day for women, 30 grams for men) and water to improve satiety and decrease hunger signals. .  Additional education and resources provided today: None  Recommended Physical Activity Goals  Cindy Dixon has been advised to work up to 150 minutes of moderate intensity aerobic activity a week and strengthening exercises 2-3 times per week for cardiovascular health, weight loss maintenance and preservation of muscle mass.  She has agreed to :  Think about enjoyable ways to increase daily physical activity and overcoming barriers to exercise, Increase physical activity in their day and reduce sedentary  time (increase NEAT)., Increase volume of physical activity to a goal of 240 minutes a week, and Combine aerobic and strengthening exercises for efficiency and improved cardiometabolic health.  Medical Interventions and Pharmacotherapy  We  discussed various medication options to help Darnette with her weight loss efforts and we both agreed to : Adequate clinical response to anti-obesity medication, continue current regimen  Associated Conditions Impacted by Obesity Treatment  Assessment & Plan Prediabetes Cindy Dixon's last HbA1c was 5.1%, indicating good control.  She is currently on Wegovy  and metformin  for pharmacoprophylaxis without any adverse effects and will continue medication. Class 2 severe obesity with serious comorbidity and body mass index (BMI) of 37.0 to 37.9 in adult, unspecified obesity type Class 2 Obesity with abnormal food appetite Class 2 obesity with abnormal food appetite, currently on Wegovy  2.4 mg weekly. She maintains a 1500 calorie nutrition plan 75% of the time, with adequate hydration and sleep. Engages in cardio exercise five days a week for 15 minutes. Reports feeling full quickly without a decrease in appetite. Body composition shows a decrease in body fat percentage from 52% to 39%. Weight loss of 76 pounds, a 23% reduction, exceeding the typical 15% reduction seen with Wegovy . Weight loss may plateau after 60 weeks, but she has exceeded expected outcomes due to her efforts. - Continue Wegovy  2.4 mg weekly. - Continue 1500 calorie nutrition plan. - Increase physical activity to 240 minutes per week, including cardio and weight training. - Repeat metabolic rate test in 4 weeks to determine if adaptive thermogenesis is applied. - Consider additional medication if weight loss plateaus after reviewing metabolic test results. Abnormal food appetite Improved on GLP-1 and metformin .Cindy Dixon  She has increased orexigenic signaling, impaired satiety and inhibitory control. 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  In addition to nutritional and behavioral strategies she benefits from ongoing pharmacotherapy.            Objective   Physical Exam:  Blood pressure 123/80, pulse 67, temperature 98.1 F (36.7 C), height 5' 8 (1.727 m), weight 245 lb (111.1 kg), last menstrual period 11/24/2023, SpO2 98%. Body mass index is 37.25 kg/m.  General: She is overweight, cooperative, alert, well developed, and in no acute distress. PSYCH: Has normal mood, affect and thought process.   HEENT: EOMI, sclerae are anicteric. Lungs: Normal breathing effort, no conversational dyspnea. Extremities: No edema.  Neurologic: No gross sensory or motor deficits. No tremors or fasciculations noted.    Diagnostic Data Reviewed:  BMET    Component Value Date/Time   NA 139 04/16/2023 0823   K 4.8 04/16/2023 0823   CL 103 04/16/2023 0823   CO2 21 04/16/2023 0823   GLUCOSE 71 04/16/2023 0823   GLUCOSE 115 (H) 04/05/2020 1632   BUN 9 04/16/2023 0823   CREATININE 0.72 04/16/2023 0823   CALCIUM 9.6 04/16/2023 0823   GFRNONAA >60 04/05/2020 1632   GFRAA 137 10/11/2019 1040   Lab Results  Component Value Date   HGBA1C 5.2 04/16/2023   HGBA1C 5.7 (H) 11/12/2018   Lab Results  Component Value Date   INSULIN  10.4 04/16/2023   INSULIN  34.1 (H) 10/11/2019   Lab Results  Component Value Date   TSH 1.640 04/16/2023   CBC    Component Value Date/Time   WBC 8.8 04/16/2023 0823   WBC 10.9 (H) 04/05/2020 1632   RBC 5.03 04/16/2023 0823   RBC 4.61 04/05/2020 1632   HGB 13.9 04/16/2023 0823  HCT 43.5 04/16/2023 0823   PLT 336 04/16/2023 0823   MCV 87 04/16/2023 0823   MCH 27.6 04/16/2023 0823   MCH 28.2 04/05/2020 1632   MCHC 32.0 04/16/2023 0823   MCHC 33.2 04/05/2020 1632   RDW 13.2 04/16/2023 0823   Iron Studies No results found for: IRON, TIBC, FERRITIN, IRONPCTSAT Lipid Panel     Component Value Date/Time   CHOL 138 04/16/2023 0823   TRIG 103 04/16/2023 0823   HDL 39 (L) 04/16/2023 0823   CHOLHDL 4.0 11/12/2018 1508   LDLCALC 80 04/16/2023 0823   Hepatic Function Panel     Component  Value Date/Time   PROT 7.4 04/16/2023 0823   ALBUMIN 4.5 04/16/2023 0823   AST 13 04/16/2023 0823   ALT 13 04/16/2023 0823   ALKPHOS 71 04/16/2023 0823   BILITOT 0.3 04/16/2023 0823      Component Value Date/Time   TSH 1.640 04/16/2023 0823   Nutritional Lab Results  Component Value Date   VD25OH 36.4 04/16/2023   VD25OH 29.2 (L) 08/12/2022   VD25OH 25.2 (L) 11/20/2021    Medications: Outpatient Encounter Medications as of 11/24/2023  Medication Sig   Cholecalciferol  (VITAMIN D3) 25 MCG (1000 UT) CAPS Take 1 capsule (1,000 Units total) by mouth daily.   fluticasone  (FLONASE ) 50 MCG/ACT nasal spray Place 2 sprays into both nostrils daily.   metFORMIN  (GLUCOPHAGE -XR) 500 MG 24 hr tablet Take 1 tablet (500 mg total) by mouth daily with breakfast.   Multiple Vitamin (MULTIVITAMIN WITH MINERALS) TABS tablet Take 1 tablet by mouth daily.   ondansetron  (ZOFRAN ) 4 MG tablet Take 1 tablet (4 mg total) by mouth every 8 (eight) hours as needed for nausea or vomiting.   pantoprazole  (PROTONIX ) 40 MG tablet Take 1 tablet (40 mg total) by mouth daily.   semaglutide -weight management (WEGOVY ) 2.4 MG/0.75ML SOAJ SQ injection Inject 2.4 mg into the skin once a week.   SUMAtriptan  (IMITREX ) 50 MG tablet Take 1 tablet (50 mg total) by mouth as needed for migraine. May repeat in 2 hours if headache persists or recurs. Do not exceed 2 tablets in 24 hours.   [DISCONTINUED] metFORMIN  (GLUCOPHAGE -XR) 500 MG 24 hr tablet Take 1 tablet (500 mg total) by mouth daily with breakfast.   [DISCONTINUED] semaglutide -weight management (WEGOVY ) 2.4 MG/0.75ML SOAJ SQ injection Inject 2.4 mg into the skin once a week.   No facility-administered encounter medications on file as of 11/24/2023.     Follow-Up   Return in about 4 weeks (around 12/22/2023) for Fasting and 30 minutes early for IC.Cindy Dixon She was informed of the importance of frequent follow up visits to maximize her success with intensive lifestyle  modifications for her multiple health conditions.  Attestation Statement   Reviewed by clinician on day of visit: allergies, medications, problem list, medical history, surgical history, family history, social history, and previous encounter notes.     Lucas Parker, MD

## 2023-11-24 NOTE — Assessment & Plan Note (Signed)
 Class 2 Obesity with abnormal food appetite Class 2 obesity with abnormal food appetite, currently on Wegovy  2.4 mg weekly. She maintains a 1500 calorie nutrition plan 75% of the time, with adequate hydration and sleep. Engages in cardio exercise five days a week for 15 minutes. Reports feeling full quickly without a decrease in appetite. Body composition shows a decrease in body fat percentage from 52% to 39%. Weight loss of 76 pounds, a 23% reduction, exceeding the typical 15% reduction seen with Wegovy . Weight loss may plateau after 60 weeks, but she has exceeded expected outcomes due to her efforts. - Continue Wegovy  2.4 mg weekly. - Continue 1500 calorie nutrition plan. - Increase physical activity to 240 minutes per week, including cardio and weight training. - Repeat metabolic rate test in 4 weeks to determine if adaptive thermogenesis is applied. - Consider additional medication if weight loss plateaus after reviewing metabolic test results.

## 2023-11-24 NOTE — Assessment & Plan Note (Signed)
 Improved on GLP-1 and metformin ..  She has increased orexigenic signaling, impaired satiety and inhibitory control. 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  In addition to nutritional and behavioral strategies she benefits from ongoing pharmacotherapy.

## 2023-11-28 ENCOUNTER — Encounter (HOSPITAL_COMMUNITY): Payer: Self-pay

## 2023-11-28 ENCOUNTER — Encounter (INDEPENDENT_AMBULATORY_CARE_PROVIDER_SITE_OTHER): Payer: Self-pay | Admitting: Internal Medicine

## 2023-11-28 ENCOUNTER — Other Ambulatory Visit (HOSPITAL_COMMUNITY): Payer: Self-pay

## 2023-12-01 ENCOUNTER — Other Ambulatory Visit (HOSPITAL_COMMUNITY): Payer: Self-pay

## 2023-12-01 NOTE — Telephone Encounter (Signed)
 Good afternoon!  Patient has called the insurance company and they told her that the PA has to be sent to the pharmacy from Dr. Francyne. She said she called after she received the follow up from [facility].   She would like a MyChart message with an update.  Thanks!

## 2023-12-22 ENCOUNTER — Ambulatory Visit (INDEPENDENT_AMBULATORY_CARE_PROVIDER_SITE_OTHER): Payer: Self-pay | Admitting: Internal Medicine

## 2023-12-22 ENCOUNTER — Other Ambulatory Visit (HOSPITAL_COMMUNITY): Payer: Self-pay

## 2023-12-22 ENCOUNTER — Encounter (INDEPENDENT_AMBULATORY_CARE_PROVIDER_SITE_OTHER): Payer: Self-pay | Admitting: Internal Medicine

## 2023-12-22 VITALS — BP 119/78 | HR 82 | Temp 98.0°F | Ht 68.0 in | Wt 248.0 lb

## 2023-12-22 DIAGNOSIS — E66812 Obesity, class 2: Secondary | ICD-10-CM | POA: Diagnosis not present

## 2023-12-22 DIAGNOSIS — R638 Other symptoms and signs concerning food and fluid intake: Secondary | ICD-10-CM

## 2023-12-22 DIAGNOSIS — Z9851 Tubal ligation status: Secondary | ICD-10-CM | POA: Diagnosis not present

## 2023-12-22 DIAGNOSIS — R7303 Prediabetes: Secondary | ICD-10-CM

## 2023-12-22 DIAGNOSIS — Z6837 Body mass index (BMI) 37.0-37.9, adult: Secondary | ICD-10-CM

## 2023-12-22 MED ORDER — METFORMIN HCL ER 500 MG PO TB24
500.0000 mg | ORAL_TABLET | Freq: Every day | ORAL | 0 refills | Status: DC
  Filled 2023-12-22 – 2024-01-11 (×2): qty 90, 90d supply, fill #0

## 2023-12-22 MED ORDER — PHENTERMINE HCL 37.5 MG PO TABS
18.7500 mg | ORAL_TABLET | Freq: Every day | ORAL | 0 refills | Status: AC
  Filled 2023-12-22: qty 15, 30d supply, fill #0

## 2023-12-22 NOTE — Assessment & Plan Note (Signed)
 Weight: decrease of 71.5 lb (22.4%) over 4 years, 4 months  Start: 08/03/2019 319 lb 8 oz (144.9 kg) (H)  End: 12/22/2023 248 lb (112.5 kg)  After having a high degree of success with nutritional, behavioral and pharmacotherapy.  Her insurance no longer covers Wegovy  as she is now experiencing increased hunger signals. As of June she also had entered a plateau.  We checked indirect calorimetry and her resting energy expenditure was 1930 cal this is comparable to her basal metabolic rate so there is no signs of metabolic slowing as to cause.  She has very limited treatment options and does benefit from ongoing pharmacotherapy to help with orixegenic signaling and impaired satiety.  After discussion of benefits and side effects she will be started on phentermine-see above.  She cannot afford Qsymia so we may have to try individual components.  We discussed strategies to manage emotional hunger.  She will continue on metformin  for diabetes prevention.  We may consider increasing medication to twice a day now that she is no longer on Wegovy  for pharmacal prevention.  No adverse effects reported she needs refills on medication.- Advised to monitor calorie intake, aiming for 1500 calories or less per day. - Recommended using protein shakes and fruits for snacks to manage cravings. - Instructed to limit caffeine intake and avoid energy drinks. - Advised against excessive alcohol consumption while on phentermine. - Encouraged regular physical activity, aiming for 240 minutes per week. - Scheduled follow-up appointment in four weeks.

## 2023-12-22 NOTE — Progress Notes (Signed)
 Office: [phone]  /  Fax: [phone]  Weight Summary and Body Composition Analysis (BIA)  Vitals Temp: 98 F (36.7 C) BP: 119/78 Pulse Rate: 82 SpO2: 99 %   Anthropometric Measurements Height: 5' 8 (1.727 m) Weight: 248 lb (112.5 kg) BMI (Calculated): 37.72 Weight at Last Visit: 245 lb Weight Lost Since Last Visit: 0 lb Weight Gained Since Last Visit: 3 lb Starting Weight: 321 lb Total Weight Loss (lbs): 73 lb (33.1 kg)   Body Composition  Body Fat %: 46.3 % Fat Mass (lbs): 115 lbs Muscle Mass (lbs): 126.6 lbs Total Body Water (lbs): 91.6 lbs Visceral Fat Rating : 11    RMR: 1930  Today's Visit #: 55  Starting Date: 10/11/19   Subjective   Chief Complaint: Obesity  Interval History Discussed the use of AI scribe software for clinical note transcription with the patient, who gave verbal consent to proceed.  History of Present Illness Cindy Dixon is a 33 year old female who presents for medical weight management.  Since last office visit she has gained 3 pounds.  She has experienced a weight gain of three pounds and is confused about the cause. She reports that she is no longer receiving her weight management injection, Wegovy , for approximately three weeks due to insurance coverage issues. Medicaid no longer covers the medication unless the BMI is 40 or above.  She experiences significant anxiety related to eating, describing 'a lot of anxiety' and 'sweaty hands' when trying to control her eating habits. She attempts to manage her hunger by eating fruit but finds it challenging to resist eating more. With the injection, she did not experience these feelings of anxiety and hunger.  Her pharmacy informed her that a prior authorization is required for the medication, but she is unsure about the process. She has contacted Medicaid, who indicated that the medication is not approved for weight loss under her current plan.  She is concerned about  potential weight gain and wants to avoid regaining weight. She has been trying to control her eating habits despite the absence of the medication and is worried about the impact of stress and emotional eating on her weight.  Her current medication regimen includes metformin . She drinks a lot of coffee, which may contribute to her anxiety and headaches.  She has no history of addiction or issues with controlled substances. She occasionally consumes alcohol on weekends.  Denies using phentermine or Qsymia in the past     Challenges affecting patient progress: strong hunger signals and/or impaired satiety / inhibitory control, emotional eating, and having difficulties with GLP-1 or AOM coverage.    Pharmacotherapy for weight management: She has been on Wegovy  but medication is no longer covered  Assessment and Plan   Treatment Plan For Obesity:  Recommended Dietary Goals  Carine is currently in the action stage of change. As such, her goal is to continue weight management plan. She has agreed to: keep a food journal with a target of  1500 calories per day and 90-120 grams of protein per day or 30-40 grams per meal.  Behavioral Health and Counseling  We discussed the following behavioral modification strategies today: work on tracking and journaling calories using tracking application, work on managing stress, creating time for self-care and relaxation, avoiding temptations and identifying enticing environmental cues, continue to work on maintaining a reduced calorie state, getting the recommended amount of protein, incorporating whole foods, making healthy choices, staying well hydrated and practicing mindfulness when eating., increase  protein intake, fibrous foods (25 grams per day for women, 30 grams for men) and water to improve satiety and decrease hunger signals. , and avoid all or none thinking.  Additional education and resources provided today: None  Recommended Physical Activity  Goals  Jaley has been advised to work up to 150 minutes of moderate intensity aerobic activity a week and strengthening exercises 2-3 times per week for cardiovascular health, weight loss maintenance and preservation of muscle mass.  She has agreed to :  Increase volume of physical activity to a goal of 240 minutes a week and Combine aerobic and strengthening exercises for efficiency and improved cardiometabolic health.  Medical Interventions and Pharmacotherapy  We discussed various medication options to help Hannan with her weight loss efforts and we both agreed to : She no longer has coverage for Wegovy . We have discussed that long-term use of phentermine for weight management. Patient informed that use beyond 12 weeks is off-label. Reviewed evidence supporting extended use in select patients with regular monitoring. Risks discussed: insomnia, increased heart rate, elevated BP, and potential for dependence. Alternatives include lifestyle interventions, FDA-approved medications (e.g., Wegovy , Zepbound, Qsymia, Contrave)- these are either not covered or cost prohibitive and bariatric surgery. Medical history reviewed for contraindications.  Patient verbalized understanding and agreed to continue phentermine with close monitoring. Plan includes regular follow-up every 4-6 weeks to assess efficacy, side effects, and vitals.  Medication safety: Reviewed common side effects of phentermine, no side effects reported.  Reviewed vitals signs and they are stable Reviewed for contraindications, none present Reviewed state registry for controlled substances and no other controlled substances found Medication will be discontinued if less than 5% weight loss in 6 months Discussed safety data and off label use for long-term treatment of obesity.   Associated Conditions Impacted by Obesity Treatment  Assessment & Plan Class 2 severe obesity with serious comorbidity and body mass index (BMI) of  37.0 to 37.9 in adult, unspecified obesity type Abnormal food appetite Prediabetes Weight: decrease of 71.5 lb (22.4%) over 4 years, 4 months  Start: 08/03/2019 319 lb 8 oz (144.9 kg) (H)  End: 12/22/2023 248 lb (112.5 kg)  After having a high degree of success with nutritional, behavioral and pharmacotherapy.  Her insurance no longer covers Wegovy  as she is now experiencing increased hunger signals. As of June she also had entered a plateau.  We checked indirect calorimetry and her resting energy expenditure was 1930 cal this is comparable to her basal metabolic rate so there is no signs of metabolic slowing as to cause.  She has very limited treatment options and does benefit from ongoing pharmacotherapy to help with orixegenic signaling and impaired satiety.  After discussion of benefits and side effects she will be started on phentermine-see above.  She cannot afford Qsymia so we may have to try individual components.  We discussed strategies to manage emotional hunger.  She will continue on metformin  for diabetes prevention.  We may consider increasing medication to twice a day now that she is no longer on Wegovy  for pharmacal prevention.  No adverse effects reported she needs refills on medication.- Advised to monitor calorie intake, aiming for 1500 calories or less per day. - Recommended using protein shakes and fruits for snacks to manage cravings. - Instructed to limit caffeine intake and avoid energy drinks. - Advised against excessive alcohol consumption while on phentermine. - Encouraged regular physical activity, aiming for 240 minutes per week. - Scheduled follow-up appointment in four weeks. History of tubal  ligation          Objective   Physical Exam:  Blood pressure 119/78, pulse 82, temperature 98 F (36.7 C), height 5' 8 (1.727 m), weight 248 lb (112.5 kg), last menstrual period 11/24/2023, SpO2 99%. Body mass index is 37.71 kg/m.  General: She is overweight,  cooperative, alert, well developed, and in no acute distress. PSYCH: Has normal mood, affect and thought process.   HEENT: EOMI, sclerae are anicteric. Lungs: Normal breathing effort, no conversational dyspnea. Extremities: No edema.  Neurologic: No gross sensory or motor deficits. No tremors or fasciculations noted.    Diagnostic Data Reviewed:  BMET    Component Value Date/Time   NA 139 04/16/2023 0823   K 4.8 04/16/2023 0823   CL 103 04/16/2023 0823   CO2 21 04/16/2023 0823   GLUCOSE 71 04/16/2023 0823   GLUCOSE 115 (H) 04/05/2020 1632   BUN 9 04/16/2023 0823   CREATININE 0.72 04/16/2023 0823   CALCIUM 9.6 04/16/2023 0823   GFRNONAA >60 04/05/2020 1632   GFRAA 137 10/11/2019 1040   Lab Results  Component Value Date   HGBA1C 5.2 04/16/2023   HGBA1C 5.7 (H) 11/12/2018   Lab Results  Component Value Date   INSULIN  10.4 04/16/2023   INSULIN  34.1 (H) 10/11/2019   Lab Results  Component Value Date   TSH 1.640 04/16/2023   CBC    Component Value Date/Time   WBC 8.8 04/16/2023 0823   WBC 10.9 (H) 04/05/2020 1632   RBC 5.03 04/16/2023 0823   RBC 4.61 04/05/2020 1632   HGB 13.9 04/16/2023 0823   HCT 43.5 04/16/2023 0823   PLT 336 04/16/2023 0823   MCV 87 04/16/2023 0823   MCH 27.6 04/16/2023 0823   MCH 28.2 04/05/2020 1632   MCHC 32.0 04/16/2023 0823   MCHC 33.2 04/05/2020 1632   RDW 13.2 04/16/2023 0823   Iron Studies No results found for: IRON, TIBC, FERRITIN, IRONPCTSAT Lipid Panel     Component Value Date/Time   CHOL 138 04/16/2023 0823   TRIG 103 04/16/2023 0823   HDL 39 (L) 04/16/2023 0823   CHOLHDL 4.0 11/12/2018 1508   LDLCALC 80 04/16/2023 0823   Hepatic Function Panel     Component Value Date/Time   PROT 7.4 04/16/2023 0823   ALBUMIN 4.5 04/16/2023 0823   AST 13 04/16/2023 0823   ALT 13 04/16/2023 0823   ALKPHOS 71 04/16/2023 0823   BILITOT 0.3 04/16/2023 0823      Component Value Date/Time   TSH 1.640 04/16/2023 0823    Nutritional Lab Results  Component Value Date   VD25OH 36.4 04/16/2023   VD25OH 29.2 (L) 08/12/2022   VD25OH 25.2 (L) 11/20/2021    Medications: Outpatient Encounter Medications as of 12/22/2023  Medication Sig   Cholecalciferol  (VITAMIN D3) 25 MCG (1000 UT) CAPS Take 1 capsule (1,000 Units total) by mouth daily.   fluticasone  (FLONASE ) 50 MCG/ACT nasal spray Place 2 sprays into both nostrils daily.   metFORMIN  (GLUCOPHAGE -XR) 500 MG 24 hr tablet Take 1 tablet (500 mg total) by mouth daily with breakfast.   Multiple Vitamin (MULTIVITAMIN WITH MINERALS) TABS tablet Take 1 tablet by mouth daily.   ondansetron  (ZOFRAN ) 4 MG tablet Take 1 tablet (4 mg total) by mouth every 8 (eight) hours as needed for nausea or vomiting.   pantoprazole  (PROTONIX ) 40 MG tablet Take 1 tablet (40 mg total) by mouth daily.   semaglutide -weight management (WEGOVY ) 2.4 MG/0.75ML SOAJ SQ injection Inject 2.4 mg into the  skin once a week.   SUMAtriptan  (IMITREX ) 50 MG tablet Take 1 tablet (50 mg total) by mouth as needed for migraine. May repeat in 2 hours if headache persists or recurs. Do not exceed 2 tablets in 24 hours.   No facility-administered encounter medications on file as of 12/22/2023.     Follow-Up   No follow-ups on file.SABRA She was informed of the importance of frequent follow up visits to maximize her success with intensive lifestyle modifications for her multiple health conditions.  Attestation Statement   Reviewed by clinician on day of visit: allergies, medications, problem list, medical history, surgical history, family history, social history, and previous encounter notes.     Lucas Parker, MD

## 2024-01-01 ENCOUNTER — Other Ambulatory Visit (HOSPITAL_COMMUNITY): Payer: Self-pay

## 2024-01-12 ENCOUNTER — Other Ambulatory Visit (HOSPITAL_COMMUNITY): Payer: Self-pay

## 2024-01-12 ENCOUNTER — Encounter (HOSPITAL_COMMUNITY): Payer: Self-pay

## 2024-01-22 ENCOUNTER — Encounter (INDEPENDENT_AMBULATORY_CARE_PROVIDER_SITE_OTHER): Payer: Self-pay | Admitting: Internal Medicine

## 2024-01-22 ENCOUNTER — Ambulatory Visit (INDEPENDENT_AMBULATORY_CARE_PROVIDER_SITE_OTHER): Admitting: Internal Medicine

## 2024-01-22 ENCOUNTER — Other Ambulatory Visit (HOSPITAL_COMMUNITY): Payer: Self-pay

## 2024-01-22 DIAGNOSIS — E66812 Obesity, class 2: Secondary | ICD-10-CM | POA: Diagnosis not present

## 2024-01-22 DIAGNOSIS — Z6837 Body mass index (BMI) 37.0-37.9, adult: Secondary | ICD-10-CM

## 2024-01-22 DIAGNOSIS — R7303 Prediabetes: Secondary | ICD-10-CM | POA: Diagnosis not present

## 2024-01-22 MED ORDER — METFORMIN HCL ER 500 MG PO TB24
500.0000 mg | ORAL_TABLET | Freq: Two times a day (BID) | ORAL | 0 refills | Status: AC
  Filled 2024-01-22 – 2024-02-10 (×2): qty 180, 90d supply, fill #0

## 2024-01-22 NOTE — Progress Notes (Signed)
 Office: [phone]  /  Fax: [phone]  Weight Summary and Body Composition Analysis (BIA)  Vitals Temp: 98 F (36.7 C) BP: 132/84 Pulse Rate: 75 SpO2: 98 %   Anthropometric Measurements Height: 5' 8 (1.727 m) Weight: 252 lb (114.3 kg) BMI (Calculated): 38.33 Weight at Last Visit: 248 lb Weight Lost Since Last Visit: 0 lb Weight Gained Since Last Visit: 4 lb Starting Weight: 321 lb Total Weight Loss (lbs): 69 lb (31.3 kg)   Body Composition  Body Fat %: 46.6 % Fat Mass (lbs): 117.8 lbs Muscle Mass (lbs): 128 lbs Total Body Water (lbs): 90.6 lbs Visceral Fat Rating : 11    RMR: 1930  No data recorded No data recorded  Subjective   Chief Complaint: Obesity  Interval History Discussed the use of AI scribe software for clinical note transcription with the patient, who gave verbal consent to proceed.  History of Present Illness Cindy Dixon is a 33 year old female who presents for medical weight management.  Since last office visit she has gained 4 pounds.  After having lost approximately 82 pounds of 25% of her body weight after 3 years she is experiencing some weight regain after discontinuation of Wegovy  due to this and Medicaid.  She is concerned about weight gain despite not eating excessively. Previously, she was on Wegovy , which helped manage her weight, but her Medicaid stopped covering it. She is apprehensive about taking phentermine  due to potential side effects like increased blood pressure and heart rate, especially since she lives alone with her young daughters.  She is concerned about potential side effects of phentermine , such as feeling anxious or experiencing tachycardia.  She notes that she has not been consuming soda and has been mindful of her diet, but suspects that her weight gain might be due to eating bread, which she did not consume as much of in Cindy Dixon.  She feels full with small portions during meals and is not consuming  more than usual.     Challenges affecting patient progress: strong hunger signals and/or impaired satiety / inhibitory control and having difficulties with GLP-1 or AOM coverage.    Pharmacotherapy for weight management: She is currently taking Metformin  (off label use for weight management and / or insulin  resistance and / or diabetes prevention) with adequate clinical response  and without side effects. and did not start phentermine  as prescribed.   Assessment and Plan   Treatment Plan For Obesity:  Recommended Dietary Goals  Cindy Dixon is currently in the action stage of change. As such, her goal is to continue weight management plan. She has agreed to: continue current plan  Behavioral Health and Counseling  We discussed the following behavioral modification strategies today: continue to work on maintaining a reduced calorie state, getting the recommended amount of protein, incorporating whole foods, making healthy choices, staying well hydrated and practicing mindfulness when eating. and increase protein intake, fibrous foods (25 grams per day for women, 30 grams for men) and water to improve satiety and decrease hunger signals. .  Additional education and resources provided today: None  Recommended Physical Activity Goals  Cindy Dixon has been advised to work up to 150 minutes of moderate intensity aerobic activity a week and strengthening exercises 2-3 times per week for cardiovascular health, weight loss maintenance and preservation of muscle mass.  She has agreed to :  Think about enjoyable ways to increase daily physical activity and overcoming barriers to exercise, Increase physical activity in their day and reduce  sedentary time (increase NEAT)., Increase volume of physical activity to a goal of 240 minutes a week, and Combine aerobic and strengthening exercises for efficiency and improved cardiometabolic health.  Medical Interventions and Pharmacotherapy  We discussed  various medication options to help Cindy Dixon with her weight loss efforts and we both agreed to : Start phentermine .  Questions regarding side effects were addressed.  She benefits from ongoing pharmacotherapy  Associated Conditions Impacted by Obesity Treatment  Assessment & Plan Class 2 severe obesity with serious comorbidity and body mass index (BMI) of 37.0 to 37.9 in adult, unspecified obesity type Prediabetes Weight: decrease of 82 lb (25.5%) over 3 years, 10 months  Start: 10/11/2019 321 lb (145.6 kg) (H)  End: 08/20/2023 239 lb (108.4 kg)   After significant progress he is starting to experience weight regain as expected after discontinuation of Wegovy .  I will like for her to begin tracking and journaling for a target calories of 1500 cal.  We discussed reduction of carbohydrates.  We addressed concerns she had regarding phentermine .  Considering metabolic adaptations as well as neurological and gastrointestinal phenotype of her obesity she does benefit from ongoing pharmacotherapy.  She has limited treatment options at present time. Weight gain since discontinuation of Wegovy  due to insurance coverage issues.  She was inquiring as to why Medicaid would not cover it good amount of time explaining this to her. - Increased metformin  to twice daily with meals. - Initiated phentermine , starting with half a tablet on an empty stomach in the morning. - Educated on taking phentermine  1 to 2 hours after meals but preferably on an empty stomach. - Advised on monitoring for side effects such as increased heart rate and anxiety. - Discussed potential for lower dose phentermine  if side effects occur, noting increased cost. - Encouraged reduction of carbohydrate intake and monitoring of caloric intake to 1500 calories per day. - Advised on maintaining physical activity. -She will continue on metformin  for weight management and diabetes prevention          Objective   Physical Exam:  Blood  pressure 132/84, pulse 75, temperature 98 F (36.7 C), height 5' 8 (1.727 m), weight 252 lb (114.3 kg), last menstrual period 01/22/2024, SpO2 98%. Body mass index is 38.32 kg/m.  General: She is overweight, cooperative, alert, well developed, and in no acute distress. PSYCH: Has normal mood, affect and thought process.   HEENT: EOMI, sclerae are anicteric. Lungs: Normal breathing effort, no conversational dyspnea. Extremities: No edema.  Neurologic: No gross sensory or motor deficits. No tremors or fasciculations noted.    Diagnostic Data Reviewed:  BMET    Component Value Date/Time   NA 139 04/16/2023 0823   K 4.8 04/16/2023 0823   CL 103 04/16/2023 0823   CO2 21 04/16/2023 0823   GLUCOSE 71 04/16/2023 0823   GLUCOSE 115 (H) 04/05/2020 1632   BUN 9 04/16/2023 0823   CREATININE 0.72 04/16/2023 0823   CALCIUM 9.6 04/16/2023 0823   GFRNONAA >60 04/05/2020 1632   GFRAA 137 10/11/2019 1040   Lab Results  Component Value Date   HGBA1C 5.2 04/16/2023   HGBA1C 5.7 (H) 11/12/2018   Lab Results  Component Value Date   INSULIN  10.4 04/16/2023   INSULIN  34.1 (H) 10/11/2019   Lab Results  Component Value Date   TSH 1.640 04/16/2023   CBC    Component Value Date/Time   WBC 8.8 04/16/2023 0823   WBC 10.9 (H) 04/05/2020 1632   RBC 5.03 04/16/2023 9176  RBC 4.61 04/05/2020 1632   HGB 13.9 04/16/2023 0823   HCT 43.5 04/16/2023 0823   PLT 336 04/16/2023 0823   MCV 87 04/16/2023 0823   MCH 27.6 04/16/2023 0823   MCH 28.2 04/05/2020 1632   MCHC 32.0 04/16/2023 0823   MCHC 33.2 04/05/2020 1632   RDW 13.2 04/16/2023 0823   Iron Studies No results found for: IRON, TIBC, FERRITIN, IRONPCTSAT Lipid Panel     Component Value Date/Time   CHOL 138 04/16/2023 0823   TRIG 103 04/16/2023 0823   HDL 39 (L) 04/16/2023 0823   CHOLHDL 4.0 11/12/2018 1508   LDLCALC 80 04/16/2023 0823   Hepatic Function Panel     Component Value Date/Time   PROT 7.4 04/16/2023 0823    ALBUMIN 4.5 04/16/2023 0823   AST 13 04/16/2023 0823   ALT 13 04/16/2023 0823   ALKPHOS 71 04/16/2023 0823   BILITOT 0.3 04/16/2023 0823      Component Value Date/Time   TSH 1.640 04/16/2023 0823   Nutritional Lab Results  Component Value Date   VD25OH 36.4 04/16/2023   VD25OH 29.2 (L) 08/12/2022   VD25OH 25.2 (L) 11/20/2021    Medications: Outpatient Encounter Medications as of 01/22/2024  Medication Sig   Cholecalciferol  (VITAMIN D3) 25 MCG (1000 UT) CAPS Take 1 capsule (1,000 Units total) by mouth daily.   fluticasone  (FLONASE ) 50 MCG/ACT nasal spray Place 2 sprays into both nostrils daily.   Multiple Vitamin (MULTIVITAMIN WITH MINERALS) TABS tablet Take 1 tablet by mouth daily.   ondansetron  (ZOFRAN ) 4 MG tablet Take 1 tablet (4 mg total) by mouth every 8 (eight) hours as needed for nausea or vomiting.   pantoprazole  (PROTONIX ) 40 MG tablet Take 1 tablet (40 mg total) by mouth daily.   phentermine  (ADIPEX-P ) 37.5 MG tablet Take 1/2 tablet (18.75 mg total) by mouth daily before breakfast.   SUMAtriptan  (IMITREX ) 50 MG tablet Take 1 tablet (50 mg total) by mouth as needed for migraine. May repeat in 2 hours if headache persists or recurs. Do not exceed 2 tablets in 24 hours.   [DISCONTINUED] metFORMIN  (GLUCOPHAGE -XR) 500 MG 24 hr tablet Take 1 tablet (500 mg total) by mouth daily with breakfast.   [DISCONTINUED] semaglutide -weight management (WEGOVY ) 2.4 MG/0.75ML SOAJ SQ injection Inject 2.4 mg into the skin once a week.   metFORMIN  (GLUCOPHAGE -XR) 500 MG 24 hr tablet Take 1 tablet (500 mg total) by mouth 2 (two) times daily with a meal.   No facility-administered encounter medications on file as of 01/22/2024.     Follow-Up   No follow-ups on file.SABRA She was informed of the importance of frequent follow up visits to maximize her success with intensive lifestyle modifications for her multiple health conditions.  Attestation Statement   Reviewed by clinician on day of visit:  allergies, medications, problem list, medical history, surgical history, family history, social history, and previous encounter notes.     Lucas Parker, MD

## 2024-01-22 NOTE — Assessment & Plan Note (Signed)
 Weight: decrease of 82 lb (25.5%) over 3 years, 10 months  Start: 10/11/2019 321 lb (145.6 kg) (H)  End: 08/20/2023 239 lb (108.4 kg)   After significant progress he is starting to experience weight regain as expected after discontinuation of Wegovy .  I will like for her to begin tracking and journaling for a target calories of 1500 cal.  We discussed reduction of carbohydrates.  We addressed concerns she had regarding phentermine .  Considering metabolic adaptations as well as neurological and gastrointestinal phenotype of her obesity she does benefit from ongoing pharmacotherapy.  She has limited treatment options at present time. Weight gain since discontinuation of Wegovy  due to insurance coverage issues.  She was inquiring as to why Medicaid would not cover it good amount of time explaining this to her. - Increased metformin  to twice daily with meals. - Initiated phentermine , starting with half a tablet on an empty stomach in the morning. - Educated on taking phentermine  1 to 2 hours after meals but preferably on an empty stomach. - Advised on monitoring for side effects such as increased heart rate and anxiety. - Discussed potential for lower dose phentermine  if side effects occur, noting increased cost. - Encouraged reduction of carbohydrate intake and monitoring of caloric intake to 1500 calories per day. - Advised on maintaining physical activity. -She will continue on metformin  for weight management and diabetes prevention

## 2024-02-10 ENCOUNTER — Other Ambulatory Visit (HOSPITAL_COMMUNITY): Payer: Self-pay

## 2024-02-25 ENCOUNTER — Ambulatory Visit (INDEPENDENT_AMBULATORY_CARE_PROVIDER_SITE_OTHER): Admitting: Internal Medicine

## 2024-02-25 ENCOUNTER — Encounter (INDEPENDENT_AMBULATORY_CARE_PROVIDER_SITE_OTHER): Payer: Self-pay | Admitting: Internal Medicine

## 2024-02-25 VITALS — BP 119/81 | HR 61 | Temp 98.4°F | Ht 68.0 in | Wt 259.0 lb

## 2024-02-25 DIAGNOSIS — R7303 Prediabetes: Secondary | ICD-10-CM | POA: Diagnosis not present

## 2024-02-25 DIAGNOSIS — Z6839 Body mass index (BMI) 39.0-39.9, adult: Secondary | ICD-10-CM | POA: Diagnosis not present

## 2024-02-25 DIAGNOSIS — R638 Other symptoms and signs concerning food and fluid intake: Secondary | ICD-10-CM | POA: Diagnosis not present

## 2024-02-25 DIAGNOSIS — E66812 Obesity, class 2: Secondary | ICD-10-CM

## 2024-02-25 NOTE — Assessment & Plan Note (Signed)
 Previously improved on GLP-1.  She has a gastrointestinal and psychological phenotype.  She has increased orexigenic signaling, impaired satiety and inhibitory control. 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  In addition to nutritional and behavioral strategies she benefits from ongoing pharmacotherapy.  Patient also counseled on addressing emotional eating through none food ways.  Addressed concerns pertaining to sympathomimetics.  She is agreeable to starting phentermine .  We will consider adding topiramate  next to provide her with a lower cost alternative to Qsymia.  Affordability is an issue for patient

## 2024-02-25 NOTE — Assessment & Plan Note (Signed)
 Lab Results  Component Value Date   HGBA1C 5.2 04/16/2023   HGBA1C 5.6 06/12/2022   HGBA1C 5.5 11/20/2021   Stable at this time. Glycemic control is being addressed through the weight management plan above, with expected improvement in insulin  resistance and metabolic parameters as weight loss progresses. Will continue to monitor A1c and glucose trends.  She no longer has coverage for GLP-1.  Continue metformin  for pharmacoprevention.

## 2024-02-25 NOTE — Assessment & Plan Note (Signed)
" °  Recent weight gain of 22 pounds following discontinuation of Wegovy  due to Medicaid coverage issues. Previously on Wegovy , which was effective but is now cost-prohibitive. Phentermine  prescribed off-label to create a generic version of Qsymia to manage weight and reduce appetite. Concerns about side effects such as dizziness and increased heart rate were addressed. Phentermine  is safe for use up to two years, with no risk of heart attack or stroke. It may increase adrenaline levels, potentially causing anxiety or palpitations, but these effects are temporary. Emphasis on dietary modifications and exercise to support weight management. - Start phentermine , half a tablet 30 minutes before meals. - Monitor weight and dietary intake, aiming for 1500 calories per day. - Prioritize protein intake with meals, such as eggs, Greek yogurt, chicken, and fish. - Use protein shakes or meal replacements as needed. - Resume exercise regimen. - Will schedule follow-up in one month to assess progress.  "

## 2024-02-25 NOTE — Progress Notes (Signed)
 "  Office: [phone]  /  Fax: [phone]  Weight Summary and Body Composition Analysis (BIA)  Vitals Temp: 98.4 F (36.9 C) BP: 119/81 Pulse Rate: 61 SpO2: 98 %   Anthropometric Measurements Height: 5' 8 (1.727 m) Weight: 259 lb (117.5 kg) BMI (Calculated): 39.39 Weight at Last Visit: 252 lb Weight Lost Since Last Visit: 0 lb Weight Gained Since Last Visit: 7 lb Starting Weight: 321 lb Total Weight Loss (lbs): 62 lb (28.1 kg)   Body Composition  Body Fat %: 47.4 % Fat Mass (lbs): 123 lbs Muscle Mass (lbs): 129.8 lbs Total Body Water (lbs): 94.4 lbs Visceral Fat Rating : 12    RMR: 1930  Today's Visit #: 57  Starting Date: 10/11/19   Subjective   Chief Complaint: Obesity  Interval History  Discussed the use of AI scribe software for clinical note transcription with the patient, who gave verbal consent to proceed.  History of Present Illness Cindy Dixon is a 34 year old female who presents for medical weight management.  She is following a 1500-calorie nutrition plan with fair adherence.  She is exercising 5 days a week about 30 minutes.  She notes a decrease in physical activity due to the holidays.  She has experienced a weight gain of seven pounds after losing coverage for Wegovy  due to Medicaid issues. Previously, she was on Wegovy , which she tolerated well without side effects.  She has been prescribed phentermine  but has not started taking it due to concerns about potential side effects such as dizziness. She is apprehensive about taking the medication.   Her current medication regimen includes metformin  500 mg, taken twice daily. She is considering taking calcium supplements due to joint pain and a sensation of her bones 'cracking' when she stands, although she consumes dairy products regularly.  She has resumed exercising as of Monday and is mindful of her diet, focusing on protein intake and avoiding snacks that could lead to weight  gain.     Challenges affecting patient progress: strong hunger signals and/or impaired satiety / inhibitory control and loss of coverage for GLP-1.    Pharmacotherapy for weight management: She is currently taking she is currently on metformin  and has been prescribed phentermine  18.75 mg but she did not start medication due to concerns about medication side effects.  These at all have been reviewed with her we will be prescribed medication for her..   Assessment and Plan   Treatment Plan For Obesity:  Recommended Dietary Goals  Cindy Dixon is currently in the action stage of change. As such, her goal is to continue weight management plan. She has agreed to: continue current plan  Behavioral Health and Counseling  We discussed the following behavioral modification strategies today: continue to work on maintaining a reduced calorie state, getting the recommended amount of protein, incorporating whole foods, making healthy choices, staying well hydrated and practicing mindfulness when eating. and increase protein intake, fibrous foods (25 grams per day for women, 30 grams for men) and water to improve satiety and decrease hunger signals. .  I encouraged her to begin tracking and journaling as she is no longer on GLP-1 to ensure that she is staying within her weight loss alone.  Additional education and resources provided today: None  Recommended Physical Activity Goals  Cindy Dixon has been advised to work up to 150 minutes of moderate intensity aerobic activity a week and strengthening exercises 2-3 times per week for cardiovascular health, weight loss maintenance and preservation of muscle  mass.  She has agreed to :  Think about enjoyable ways to increase daily physical activity and overcoming barriers to exercise, Increase physical activity in their day and reduce sedentary time (increase NEAT)., Increase volume of physical activity to a goal of 240 minutes a week, and Combine aerobic and  strengthening exercises for efficiency and improved cardiometabolic health.  Medical Interventions and Pharmacotherapy  We discussed various medication options to help Cindy Dixon with her weight loss efforts and we both agreed to : Was educated on available treatment options, including pharmacotherapy with emphasis on potential side effects and other available medical interventions, Patient was counseled on the importance of maintaining healthy lifestyle habits, including balanced nutrition, regular physical activity, and behavioral modifications, while taking antiobesity medication.  Patient verbalized understanding that medication is an adjunct to, not a replacement for, lifestyle changes and that the long-term success and weight maintenance depend on continued adherence to these strategies., and  We have discussed the long-term use of phentermine  for weight management. Patient informed that use beyond 12 weeks is off-label. Reviewed evidence supporting extended use in select patients with regular monitoring. Risks discussed: insomnia, increased heart rate, elevated BP, and potential for dependence. Alternatives include lifestyle interventions, FDA-approved medications (e.g., Wegovy , Zepbound, Qsymia, Contrave)- these are either not covered, cost prohibitive or contraindicated-,  and bariatric surgery. Medical history reviewed for contraindications.  Patient verbalized understanding and agreed to continue phentermine  with close monitoring. Plan includes regular follow-up every 4-6 weeks to assess efficacy, side effects, and vitals.  Medication safety: Reviewed common side effects of phentermine , no side effects reported.  Reviewed vitals signs and they are stable Reviewed for contraindications, none present Reviewed state registry for controlled substances and no other controlled substances found Medication will be discontinued if less than 5% weight loss in 6 months Discussed safety data and off  label use for long-term treatment of obesity.  Associated Conditions Impacted by Obesity Treatment  Assessment & Plan Abnormal food appetite Previously improved on GLP-1.  She has a gastrointestinal and psychological phenotype.  She has increased orexigenic signaling, impaired satiety and inhibitory control. 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  In addition to nutritional and behavioral strategies she benefits from ongoing pharmacotherapy.  Patient also counseled on addressing emotional eating through none food ways.  Addressed concerns pertaining to sympathomimetics.  She is agreeable to starting phentermine .  We will consider adding topiramate  next to provide her with a lower cost alternative to Qsymia.  Affordability is an issue for patient Class 2 severe obesity with serious comorbidity and body mass index (BMI) of 39.0 to 39.9 in adult, unspecified obesity type  Recent weight gain of 22 pounds following discontinuation of Wegovy  due to Medicaid coverage issues. Previously on Wegovy , which was effective but is now cost-prohibitive. Phentermine  prescribed off-label to create a generic version of Qsymia to manage weight and reduce appetite. Concerns about side effects such as dizziness and increased heart rate were addressed. Phentermine  is safe for use up to two years, with no risk of heart attack or stroke. It may increase adrenaline levels, potentially causing anxiety or palpitations, but these effects are temporary. Emphasis on dietary modifications and exercise to support weight management. - Start phentermine , half a tablet 30 minutes before meals. - Monitor weight and dietary intake, aiming for 1500 calories per day. - Prioritize protein intake with meals, such as eggs, Greek yogurt, chicken, and fish. - Use protein shakes or meal replacements  as needed. - Resume exercise regimen. - Will schedule  follow-up in one month to assess progress.  Prediabetes Lab Results  Component Value Date   HGBA1C 5.2 04/16/2023   HGBA1C 5.6 06/12/2022   HGBA1C 5.5 11/20/2021   Stable at this time. Glycemic control is being addressed through the weight management plan above, with expected improvement in insulin  resistance and metabolic parameters as weight loss progresses. Will continue to monitor A1c and glucose trends.  She no longer has coverage for GLP-1.  Continue metformin  for pharmacoprevention.    Anti-obesity pharmacotherapy reviewed in detail including mechanism of action, contraindications, potential adverse effects, and need for ongoing monitoring. Patient counseled on symptoms and conditions warranting discontinuation and follow-up. Shared decision-making performed regarding continued pharmacotherapy versus alternative treatment options, including risks of untreated obesity and benefits of sustained weight loss.       Objective   Physical Exam:  Blood pressure 119/81, pulse 61, temperature 98.4 F (36.9 C), height 5' 8 (1.727 m), weight 259 lb (117.5 kg), last menstrual period 02/25/2024, SpO2 98%. Body mass index is 39.38 kg/m.  General: She is overweight, cooperative, alert, well developed, and in no acute distress. PSYCH: Has normal mood, affect and thought process.   HEENT: EOMI, sclerae are anicteric. Lungs: Normal breathing effort, no conversational dyspnea. Extremities: No edema.  Neurologic: No gross sensory or motor deficits. No tremors or fasciculations noted.    Diagnostic Data Reviewed:  BMET    Component Value Date/Time   NA 139 04/16/2023 0823   K 4.8 04/16/2023 0823   CL 103 04/16/2023 0823   CO2 21 04/16/2023 0823   GLUCOSE 71 04/16/2023 0823   GLUCOSE 115 (H) 04/05/2020 1632   BUN 9 04/16/2023 0823   CREATININE 0.72 04/16/2023 0823   CALCIUM 9.6 04/16/2023 0823   GFRNONAA >60 04/05/2020 1632   GFRAA 137 10/11/2019 1040   Lab Results  Component  Value Date   HGBA1C 5.2 04/16/2023   HGBA1C 5.7 (H) 11/12/2018   Lab Results  Component Value Date   INSULIN  10.4 04/16/2023   INSULIN  34.1 (H) 10/11/2019   Lab Results  Component Value Date   TSH 1.640 04/16/2023   CBC    Component Value Date/Time   WBC 8.8 04/16/2023 0823   WBC 10.9 (H) 04/05/2020 1632   RBC 5.03 04/16/2023 0823   RBC 4.61 04/05/2020 1632   HGB 13.9 04/16/2023 0823   HCT 43.5 04/16/2023 0823   PLT 336 04/16/2023 0823   MCV 87 04/16/2023 0823   MCH 27.6 04/16/2023 0823   MCH 28.2 04/05/2020 1632   MCHC 32.0 04/16/2023 0823   MCHC 33.2 04/05/2020 1632   RDW 13.2 04/16/2023 0823   Iron Studies No results found for: IRON, TIBC, FERRITIN, IRONPCTSAT Lipid Panel     Component Value Date/Time   CHOL 138 04/16/2023 0823   TRIG 103 04/16/2023 0823   HDL 39 (L) 04/16/2023 0823   CHOLHDL 4.0 11/12/2018 1508   LDLCALC 80 04/16/2023 0823   Hepatic Function Panel     Component Value Date/Time   PROT 7.4 04/16/2023 0823   ALBUMIN 4.5 04/16/2023 0823   AST 13 04/16/2023 0823   ALT 13 04/16/2023 0823   ALKPHOS 71 04/16/2023 0823   BILITOT 0.3 04/16/2023 0823      Component Value Date/Time   TSH 1.640 04/16/2023 0823   Nutritional Lab Results  Component Value Date   VD25OH 36.4 04/16/2023   VD25OH 29.2 (L) 08/12/2022   VD25OH 25.2 (L) 11/20/2021  Medications: Outpatient Encounter Medications as of 02/25/2024  Medication Sig   Cholecalciferol  (VITAMIN D3) 25 MCG (1000 UT) CAPS Take 1 capsule (1,000 Units total) by mouth daily.   fluticasone  (FLONASE ) 50 MCG/ACT nasal spray Place 2 sprays into both nostrils daily.   metFORMIN  (GLUCOPHAGE -XR) 500 MG 24 hr tablet Take 1 tablet (500 mg total) by mouth 2 (two) times daily with a meal.   Multiple Vitamin (MULTIVITAMIN WITH MINERALS) TABS tablet Take 1 tablet by mouth daily.   ondansetron  (ZOFRAN ) 4 MG tablet Take 1 tablet (4 mg total) by mouth every 8 (eight) hours as needed for nausea or  vomiting.   pantoprazole  (PROTONIX ) 40 MG tablet Take 1 tablet (40 mg total) by mouth daily.   phentermine  (ADIPEX-P ) 37.5 MG tablet Take 1/2 tablet (18.75 mg total) by mouth daily before breakfast.   SUMAtriptan  (IMITREX ) 50 MG tablet Take 1 tablet (50 mg total) by mouth as needed for migraine. May repeat in 2 hours if headache persists or recurs. Do not exceed 2 tablets in 24 hours.   No facility-administered encounter medications on file as of 02/25/2024.     Follow-Up   Return in about 4 weeks (around 03/24/2024) for For Weight Mangement with Dr. Francyne.SABRA She was informed of the importance of frequent follow up visits to maximize her success with intensive lifestyle modifications for her multiple health conditions.  Attestation Statement   Reviewed by clinician on day of visit: allergies, medications, problem list, medical history, surgical history, family history, social history, and previous encounter notes.     Lucas Francyne, MD  "

## 2024-03-01 ENCOUNTER — Encounter (INDEPENDENT_AMBULATORY_CARE_PROVIDER_SITE_OTHER): Payer: Self-pay | Admitting: Internal Medicine

## 2024-03-24 ENCOUNTER — Ambulatory Visit (INDEPENDENT_AMBULATORY_CARE_PROVIDER_SITE_OTHER): Admitting: Internal Medicine
